# Patient Record
Sex: Female | Born: 1975 | Race: White | Hispanic: No | Marital: Single | Smoking: Current every day smoker
Health system: Southern US, Community
[De-identification: ages and names within clinical notes are randomized; demographics above are authoritative.]

## PROBLEM LIST (undated history)

## (undated) ENCOUNTER — Emergency Department (HOSPITAL_COMMUNITY): Admission: EM | Payer: Self-pay | Source: Home / Self Care

## (undated) DIAGNOSIS — I10 Essential (primary) hypertension: Secondary | ICD-10-CM

## (undated) DIAGNOSIS — J449 Chronic obstructive pulmonary disease, unspecified: Secondary | ICD-10-CM

## (undated) DIAGNOSIS — K7460 Unspecified cirrhosis of liver: Secondary | ICD-10-CM

---

## 2010-04-01 ENCOUNTER — Inpatient Hospital Stay (HOSPITAL_COMMUNITY): Admission: EM | Admit: 2010-04-01 | Discharge: 2010-04-05 | Payer: Self-pay | Admitting: Emergency Medicine

## 2010-06-17 ENCOUNTER — Emergency Department (HOSPITAL_COMMUNITY)
Admission: EM | Admit: 2010-06-17 | Discharge: 2010-06-17 | Disposition: A | Discharge status: Home / Self Care | Payer: Self-pay | Attending: Emergency Medicine | Admitting: Emergency Medicine

## 2010-06-17 ENCOUNTER — Emergency Department (HOSPITAL_COMMUNITY): Payer: Self-pay

## 2010-06-17 DIAGNOSIS — I10 Essential (primary) hypertension: Secondary | ICD-10-CM | POA: Insufficient documentation

## 2010-06-17 DIAGNOSIS — X500XXA Overexertion from strenuous movement or load, initial encounter: Secondary | ICD-10-CM | POA: Insufficient documentation

## 2010-06-17 DIAGNOSIS — S92309A Fracture of unspecified metatarsal bone(s), unspecified foot, initial encounter for closed fracture: Secondary | ICD-10-CM | POA: Insufficient documentation

## 2010-06-17 DIAGNOSIS — Y929 Unspecified place or not applicable: Secondary | ICD-10-CM | POA: Insufficient documentation

## 2010-06-17 DIAGNOSIS — S9030XA Contusion of unspecified foot, initial encounter: Secondary | ICD-10-CM | POA: Insufficient documentation

## 2010-06-17 DIAGNOSIS — M7989 Other specified soft tissue disorders: Secondary | ICD-10-CM | POA: Insufficient documentation

## 2010-06-17 DIAGNOSIS — M79609 Pain in unspecified limb: Secondary | ICD-10-CM | POA: Insufficient documentation

## 2010-07-17 LAB — BASIC METABOLIC PANEL
BUN: 3 mg/dL — ABNORMAL LOW (ref 6–23)
CO2: 22 mEq/L (ref 19–32)
Chloride: 111 mEq/L (ref 96–112)
GFR calc non Af Amer: >60 mL/min (ref >60)
Glucose, Bld: 91 mg/dL (ref 70–99)
Potassium: 4.3 mEq/L (ref 3.5–5.1)
Sodium: 139 mEq/L (ref 135–145)

## 2010-07-17 LAB — CBC
HCT: 32.1 % — ABNORMAL LOW (ref 36.0–46.0)
Hemoglobin: 10.9 g/dL — ABNORMAL LOW (ref 12.0–15.0)
MCH: 36.7 pg — ABNORMAL HIGH (ref 26.0–34.0)
MCHC: 34 g/dL (ref 30.0–36.0)
MCV: 108.1 fL — ABNORMAL HIGH (ref 78.0–100.0)
RDW: 13.8 % (ref 11.5–15.5)

## 2010-07-17 LAB — DIFFERENTIAL
Basophils Relative: 1 % (ref 0–1)
Eosinophils Relative: 4 % (ref 0–5)
Monocytes Absolute: 0.4 10*3/uL (ref 0.1–1.0)
Monocytes Relative: 8 % (ref 3–12)
Neutro Abs: 3.4 10*3/uL (ref 1.7–7.7)

## 2010-07-18 LAB — CBC
HCT: 33.1 % — ABNORMAL LOW (ref 36.0–46.0)
HCT: 38.2 % (ref 36.0–46.0)
Hemoglobin: 11.5 g/dL — ABNORMAL LOW (ref 12.0–15.0)
Hemoglobin: 13.8 g/dL (ref 12.0–15.0)
MCH: 36.7 pg — ABNORMAL HIGH (ref 26.0–34.0)
MCHC: 34.9 g/dL (ref 30.0–36.0)
MCV: 102.4 fL — ABNORMAL HIGH (ref 78.0–100.0)
MCV: 104.4 fL — ABNORMAL HIGH (ref 78.0–100.0)
Platelets: 178 10*3/uL (ref 150–400)
Platelets: 216 10*3/uL (ref 150–400)
RBC: 3.16 MIL/uL — ABNORMAL LOW (ref 3.87–5.11)
RBC: 3.33 MIL/uL — ABNORMAL LOW (ref 3.87–5.11)
RBC: 3.74 MIL/uL — ABNORMAL LOW (ref 3.87–5.11)
WBC: 6.2 10*3/uL (ref 4.0–10.5)
WBC: 7.2 10*3/uL (ref 4.0–10.5)
WBC: 7.5 10*3/uL (ref 4.0–10.5)

## 2010-07-18 LAB — BASIC METABOLIC PANEL
CO2: 23 mEq/L (ref 19–32)
Calcium: 8.5 mg/dL (ref 8.4–10.5)
Creatinine, Ser: 0.67 mg/dL (ref 0.4–1.2)
GFR calc Af Amer: >60 mL/min (ref >60)
GFR calc non Af Amer: 37 mL/min — ABNORMAL LOW (ref >60)
GFR calc non Af Amer: >60 mL/min (ref >60)
GFR calc non Af Amer: >60 mL/min (ref >60)
Potassium: 2.6 mEq/L — CL (ref 3.5–5.1)
Potassium: 3.2 mEq/L — ABNORMAL LOW (ref 3.5–5.1)
Sodium: 133 mEq/L — ABNORMAL LOW (ref 135–145)
Sodium: 139 mEq/L (ref 135–145)
Sodium: 144 mEq/L (ref 135–145)

## 2010-07-18 LAB — HEPATIC FUNCTION PANEL
AST: 102 U/L — ABNORMAL HIGH (ref 0–37)
Albumin: 3.4 g/dL — ABNORMAL LOW (ref 3.5–5.2)
Alkaline Phosphatase: 115 U/L (ref 39–117)
Total Bilirubin: 0.6 mg/dL (ref 0.3–1.2)

## 2010-07-18 LAB — COMPREHENSIVE METABOLIC PANEL
ALT: 38 U/L — ABNORMAL HIGH (ref 0–35)
ALT: 49 U/L — ABNORMAL HIGH (ref 0–35)
AST: 72 U/L — ABNORMAL HIGH (ref 0–37)
Albumin: 2.8 g/dL — ABNORMAL LOW (ref 3.5–5.2)
Alkaline Phosphatase: 82 U/L (ref 39–117)
Alkaline Phosphatase: 86 U/L (ref 39–117)
BUN: 2 mg/dL — ABNORMAL LOW (ref 6–23)
BUN: 2 mg/dL — ABNORMAL LOW (ref 6–23)
CO2: 22 mEq/L (ref 19–32)
Chloride: 108 mEq/L (ref 96–112)
Chloride: 110 mEq/L (ref 96–112)
GFR calc non Af Amer: >60 mL/min (ref >60)
Glucose, Bld: 108 mg/dL — ABNORMAL HIGH (ref 70–99)
Potassium: 3.3 mEq/L — ABNORMAL LOW (ref 3.5–5.1)
Potassium: 3.4 mEq/L — ABNORMAL LOW (ref 3.5–5.1)
Sodium: 139 mEq/L (ref 135–145)
Total Bilirubin: 0.5 mg/dL (ref 0.3–1.2)
Total Bilirubin: 0.6 mg/dL (ref 0.3–1.2)

## 2010-07-18 LAB — CULTURE, RESPIRATORY W GRAM STAIN

## 2010-07-18 LAB — URINALYSIS, ROUTINE W REFLEX MICROSCOPIC
Nitrite: NEGATIVE
Specific Gravity, Urine: 1.025 (ref 1.005–1.030)
Urobilinogen, UA: 1 mg/dL (ref 0.0–1.0)

## 2010-07-18 LAB — ETHANOL: Alcohol, Ethyl (B): <5 mg/dL (ref 0–10)

## 2010-07-18 LAB — OSMOLALITY: Osmolality: 356 mOsm/kg — ABNORMAL HIGH (ref 275–300)

## 2010-07-18 LAB — DIFFERENTIAL
Basophils Absolute: 0 10*3/uL (ref 0.0–0.1)
Basophils Absolute: 0 10*3/uL (ref 0.0–0.1)
Basophils Absolute: 0 10*3/uL (ref 0.0–0.1)
Basophils Relative: 0 % (ref 0–1)
Basophils Relative: 1 % (ref 0–1)
Basophils Relative: 1 % (ref 0–1)
Eosinophils Absolute: 0 10*3/uL (ref 0.0–0.7)
Eosinophils Absolute: 0 10*3/uL (ref 0.0–0.7)
Eosinophils Absolute: 0.1 10*3/uL (ref 0.0–0.7)
Eosinophils Relative: 0 % (ref 0–5)
Lymphocytes Relative: 18 % (ref 12–46)
Lymphs Abs: 1.3 10*3/uL (ref 0.7–4.0)
Lymphs Abs: 1.8 10*3/uL (ref 0.7–4.0)
Monocytes Absolute: 0.5 10*3/uL (ref 0.1–1.0)
Monocytes Absolute: 0.7 10*3/uL (ref 0.1–1.0)
Monocytes Relative: 7 % (ref 3–12)
Monocytes Relative: 9 % (ref 3–12)
Neutro Abs: 2 10*3/uL (ref 1.7–7.7)
Neutro Abs: 3.9 10*3/uL (ref 1.7–7.7)
Neutro Abs: 5.4 10*3/uL (ref 1.7–7.7)
Neutrophils Relative %: 47 % (ref 43–77)
Neutrophils Relative %: 62 % (ref 43–77)

## 2010-07-18 LAB — MAGNESIUM
Magnesium: 1.4 mg/dL — ABNORMAL LOW (ref 1.5–2.5)
Magnesium: 1.4 mg/dL — ABNORMAL LOW (ref 1.5–2.5)

## 2010-07-18 LAB — RAPID URINE DRUG SCREEN, HOSP PERFORMED
Amphetamines: NOT DETECTED
Cocaine: NOT DETECTED
Opiates: NOT DETECTED
Tetrahydrocannabinol: POSITIVE — AB

## 2010-07-18 LAB — EXPECTORATED SPUTUM ASSESSMENT W GRAM STAIN, RFLX TO RESP C

## 2010-07-18 LAB — TSH: TSH: 1.112 u[IU]/mL (ref 0.350–4.500)

## 2010-07-18 LAB — ACETAMINOPHEN LEVEL: Acetaminophen (Tylenol), Serum: <10 ug/mL — ABNORMAL LOW (ref 10–30)

## 2010-07-18 LAB — PREGNANCY, URINE: Preg Test, Ur: NEGATIVE

## 2010-09-03 ENCOUNTER — Emergency Department (HOSPITAL_COMMUNITY): Payer: Self-pay

## 2010-09-03 ENCOUNTER — Encounter (HOSPITAL_COMMUNITY): Payer: Self-pay | Admitting: Radiology

## 2010-09-03 ENCOUNTER — Emergency Department (HOSPITAL_COMMUNITY)
Admission: EM | Admit: 2010-09-03 | Discharge: 2010-09-03 | Disposition: A | Discharge status: Home / Self Care | Payer: Self-pay | Attending: Emergency Medicine | Admitting: Emergency Medicine

## 2010-09-03 DIAGNOSIS — F17200 Nicotine dependence, unspecified, uncomplicated: Secondary | ICD-10-CM | POA: Insufficient documentation

## 2010-09-03 DIAGNOSIS — R509 Fever, unspecified: Secondary | ICD-10-CM | POA: Insufficient documentation

## 2010-09-03 DIAGNOSIS — I10 Essential (primary) hypertension: Secondary | ICD-10-CM | POA: Insufficient documentation

## 2010-09-03 DIAGNOSIS — B86 Scabies: Secondary | ICD-10-CM | POA: Insufficient documentation

## 2010-09-03 DIAGNOSIS — F1010 Alcohol abuse, uncomplicated: Secondary | ICD-10-CM | POA: Insufficient documentation

## 2010-09-03 DIAGNOSIS — R079 Chest pain, unspecified: Secondary | ICD-10-CM | POA: Insufficient documentation

## 2010-09-03 DIAGNOSIS — R110 Nausea: Secondary | ICD-10-CM | POA: Insufficient documentation

## 2010-09-03 DIAGNOSIS — J189 Pneumonia, unspecified organism: Secondary | ICD-10-CM | POA: Insufficient documentation

## 2010-09-03 LAB — URINALYSIS, ROUTINE W REFLEX MICROSCOPIC
Nitrite: NEGATIVE
Specific Gravity, Urine: 1.019 (ref 1.005–1.030)
Urobilinogen, UA: 0.2 mg/dL (ref 0.0–1.0)

## 2010-09-03 LAB — COMPREHENSIVE METABOLIC PANEL
Albumin: 2.5 g/dL — ABNORMAL LOW (ref 3.5–5.2)
BUN: 5 mg/dL — ABNORMAL LOW (ref 6–23)
Chloride: 103 mEq/L (ref 96–112)
Creatinine, Ser: 0.62 mg/dL (ref 0.4–1.2)
GFR calc non Af Amer: >60 mL/min (ref >60)
Total Bilirubin: 0.6 mg/dL (ref 0.3–1.2)

## 2010-09-03 LAB — URINE MICROSCOPIC-ADD ON

## 2010-09-03 LAB — DIFFERENTIAL
Eosinophils Absolute: 0 10*3/uL (ref 0.0–0.7)
Eosinophils Relative: 0 % (ref 0–5)
Lymphs Abs: 0.5 10*3/uL — ABNORMAL LOW (ref 0.7–4.0)
Monocytes Relative: 5 % (ref 3–12)
Neutrophils Relative %: 90 % — ABNORMAL HIGH (ref 43–77)

## 2010-09-03 LAB — CBC
MCH: 35.7 pg — ABNORMAL HIGH (ref 26.0–34.0)
MCV: 104.4 fL — ABNORMAL HIGH (ref 78.0–100.0)
Platelets: 241 10*3/uL (ref 150–400)
RBC: 3.61 MIL/uL — ABNORMAL LOW (ref 3.87–5.11)

## 2010-09-04 LAB — URINE CULTURE: Culture  Setup Time: 201204301642

## 2011-02-20 ENCOUNTER — Emergency Department (HOSPITAL_COMMUNITY): Payer: Self-pay

## 2011-02-20 ENCOUNTER — Emergency Department (HOSPITAL_COMMUNITY)
Admission: EM | Admit: 2011-02-20 | Discharge: 2011-02-20 | Disposition: A | Discharge status: Home / Self Care | Payer: Self-pay | Attending: Emergency Medicine | Admitting: Emergency Medicine

## 2011-02-20 DIAGNOSIS — Z79899 Other long term (current) drug therapy: Secondary | ICD-10-CM | POA: Insufficient documentation

## 2011-02-20 DIAGNOSIS — F1010 Alcohol abuse, uncomplicated: Secondary | ICD-10-CM | POA: Insufficient documentation

## 2011-02-20 DIAGNOSIS — G40802 Other epilepsy, not intractable, without status epilepticus: Secondary | ICD-10-CM | POA: Insufficient documentation

## 2011-02-20 DIAGNOSIS — F17200 Nicotine dependence, unspecified, uncomplicated: Secondary | ICD-10-CM | POA: Insufficient documentation

## 2011-02-20 DIAGNOSIS — I10 Essential (primary) hypertension: Secondary | ICD-10-CM | POA: Insufficient documentation

## 2011-02-20 DIAGNOSIS — R55 Syncope and collapse: Secondary | ICD-10-CM | POA: Insufficient documentation

## 2011-02-20 LAB — RAPID URINE DRUG SCREEN, HOSP PERFORMED
Amphetamines: NOT DETECTED
Tetrahydrocannabinol: NOT DETECTED

## 2011-02-20 LAB — POCT I-STAT, CHEM 8
BUN: 4 mg/dL — ABNORMAL LOW (ref 6–23)
Calcium, Ion: 1 mmol/L — ABNORMAL LOW (ref 1.12–1.32)
Chloride: 91 mEq/L — ABNORMAL LOW (ref 96–112)
Creatinine, Ser: 1 mg/dL (ref 0.50–1.10)
Glucose, Bld: 92 mg/dL (ref 70–99)

## 2011-02-20 LAB — URINALYSIS, ROUTINE W REFLEX MICROSCOPIC
Bilirubin Urine: NEGATIVE
Glucose, UA: NEGATIVE mg/dL
Hgb urine dipstick: NEGATIVE
Ketones, ur: NEGATIVE mg/dL
pH: 5 (ref 5.0–8.0)

## 2011-03-19 ENCOUNTER — Encounter (HOSPITAL_COMMUNITY): Payer: Self-pay | Admitting: Licensed Clinical Social Worker

## 2011-03-19 ENCOUNTER — Emergency Department (HOSPITAL_COMMUNITY)
Admission: EM | Admit: 2011-03-19 | Discharge: 2011-03-20 | Disposition: A | Discharge status: Home / Self Care | Payer: Self-pay | Attending: Emergency Medicine | Admitting: Emergency Medicine

## 2011-03-19 ENCOUNTER — Telehealth (HOSPITAL_COMMUNITY): Payer: Self-pay | Admitting: Licensed Clinical Social Worker

## 2011-03-19 DIAGNOSIS — F1910 Other psychoactive substance abuse, uncomplicated: Secondary | ICD-10-CM | POA: Insufficient documentation

## 2011-03-19 DIAGNOSIS — F1010 Alcohol abuse, uncomplicated: Secondary | ICD-10-CM | POA: Insufficient documentation

## 2011-03-19 DIAGNOSIS — R259 Unspecified abnormal involuntary movements: Secondary | ICD-10-CM | POA: Insufficient documentation

## 2011-03-19 DIAGNOSIS — M25473 Effusion, unspecified ankle: Secondary | ICD-10-CM | POA: Insufficient documentation

## 2011-03-19 DIAGNOSIS — M25476 Effusion, unspecified foot: Secondary | ICD-10-CM | POA: Insufficient documentation

## 2011-03-19 DIAGNOSIS — I10 Essential (primary) hypertension: Secondary | ICD-10-CM | POA: Insufficient documentation

## 2011-03-19 DIAGNOSIS — R062 Wheezing: Secondary | ICD-10-CM | POA: Insufficient documentation

## 2011-03-19 DIAGNOSIS — R609 Edema, unspecified: Secondary | ICD-10-CM | POA: Insufficient documentation

## 2011-03-19 LAB — DIFFERENTIAL
Basophils Relative: 1 % (ref 0–1)
Eosinophils Absolute: 0.1 10*3/uL (ref 0.0–0.7)
Lymphs Abs: 1.7 10*3/uL (ref 0.7–4.0)
Monocytes Absolute: 0.6 10*3/uL (ref 0.1–1.0)
Monocytes Relative: 10 % (ref 3–12)
Neutro Abs: 3.7 10*3/uL (ref 1.7–7.7)

## 2011-03-19 LAB — CBC
HCT: 36.4 % (ref 36.0–46.0)
Hemoglobin: 12.8 g/dL (ref 12.0–15.0)
MCH: 36.5 pg — ABNORMAL HIGH (ref 26.0–34.0)
MCHC: 35.2 g/dL (ref 30.0–36.0)
RBC: 3.51 MIL/uL — ABNORMAL LOW (ref 3.87–5.11)

## 2011-03-19 LAB — RAPID URINE DRUG SCREEN, HOSP PERFORMED
Cocaine: POSITIVE — AB
Opiates: NOT DETECTED

## 2011-03-19 LAB — POCT I-STAT, CHEM 8
BUN: <3 mg/dL — ABNORMAL LOW (ref 6–23)
Creatinine, Ser: 0.7 mg/dL (ref 0.50–1.10)
Glucose, Bld: 78 mg/dL (ref 70–99)
Hemoglobin: 13.6 g/dL (ref 12.0–15.0)
Potassium: 3.8 mEq/L (ref 3.5–5.1)

## 2011-03-19 MED ORDER — LORAZEPAM 1 MG PO TABS
1.0000 mg | ORAL_TABLET | Freq: Once | ORAL | Status: AC
  Administered 2011-03-19: 1 mg via ORAL

## 2011-03-19 MED ORDER — ONDANSETRON 4 MG PO TBDP
ORAL_TABLET | ORAL | Status: AC
  Administered 2011-03-19: 4 mg
  Filled 2011-03-19: qty 1

## 2011-03-19 MED ORDER — ONDANSETRON HCL 4 MG/2ML IJ SOLN: 4.0000 mg | Freq: Once | INTRAMUSCULAR | Status: DC

## 2011-03-19 MED ORDER — NICOTINE 21 MG/24HR TD PT24
21.0000 mg | MEDICATED_PATCH | Freq: Once | TRANSDERMAL | Status: DC
  Administered 2011-03-19: 21 mg via TRANSDERMAL
  Filled 2011-03-19: qty 1

## 2011-03-19 MED ORDER — ONDANSETRON HCL 4 MG/2ML IJ SOLN
INTRAMUSCULAR | Status: AC
  Filled 2011-03-19: qty 2

## 2011-03-19 MED ORDER — LORAZEPAM 1 MG PO TABS
ORAL_TABLET | ORAL | Status: AC
  Filled 2011-03-19: qty 1

## 2011-03-19 MED ORDER — NICOTINE 21 MG/24HR TD PT24: 21.0000 mg | MEDICATED_PATCH | Freq: Once | TRANSDERMAL | Status: DC

## 2011-03-19 MED ORDER — CLONIDINE HCL 0.1 MG PO TABS
ORAL_TABLET | ORAL | Status: AC
  Filled 2011-03-19: qty 2

## 2011-03-19 MED ORDER — NICOTINE 21 MG/24HR TD PT24
MEDICATED_PATCH | TRANSDERMAL | Status: AC
  Filled 2011-03-19: qty 1

## 2011-03-19 MED ORDER — ACETAMINOPHEN 325 MG PO TABS: 650.0000 mg | ORAL_TABLET | Freq: Once | ORAL | Status: DC

## 2011-03-19 MED ORDER — ACETAMINOPHEN 325 MG PO TABS
ORAL_TABLET | ORAL | Status: AC
  Administered 2011-03-19: 600 mg
  Filled 2011-03-19: qty 2

## 2011-03-19 MED ORDER — CLONIDINE HCL 0.1 MG PO TABS
0.2000 mg | ORAL_TABLET | Freq: Once | ORAL | Status: AC
  Administered 2011-03-19: 0.2 mg via ORAL

## 2011-03-19 NOTE — ED Notes (Signed)
[place] sandwich given to patient and family friend as well as ordered warm food tray for patient.  Patient ambulatory to bathroom and returned to room without incident.

## 2011-03-19 NOTE — ED Provider Notes (Addendum)
History     CSN: [id number] Arrival date & time: 03/19/2011 11:25 AM   First MD Initiated Contact with Patient 03/19/11 1157      Chief Complaint  Patient presents with  . Drug / Alcohol Assessment    (Consider location/radiation/quality/duration/timing/severity/associated sxs/prior treatment) HPI This is a 35 year old female with PMH of hypertension and polysubstance abuse who presents to ED for "detox from alcohol". The history is provided provided by patient. Patient states that she drank 4 beers this morning and decided to come to ED for detox from alcohol. Patient states that she feels fine now. She reports a history of abusing alcohol for 22 years with 12 packs beers a day, with which she mixes liquor or wine at times. She has never tried alcohol detox program or resources. Patient states that she only had crystal Meth detox program in the past.  Of note patient also reports a history of smoking cigarettes 1 pack a day for 25 years and abusing weed and cocaine intermittently, with last cocaine abuse days ago. Denies fever, chills or sore throat. Denies chest pain, chest pressure or palpitation. Denies nausea, vomiting or abdominal pain. Denies weakness, numbness or tingling.  Patient states that she is homeless and [place] and [place].  Past Medical History: HTN  Social history  Homeless Alcohol 12 packs/day for 22 years Smoking 1 pack/day for 25 years Abuse weed and cocaine.   Review of Systems See HPI  Allergies  Sulfa antibiotics  Home Medications   Current Outpatient Rx  Name Route Sig Dispense Refill  . ALBUTEROL SULFATE HFA 108 (90 BASE) MCG/ACT IN AERS Inhalation Inhale 2 puffs into the lungs every 4 (four) hours as needed. For shortness of breath       BP 139/94  Pulse 99  Temp(Src) 98.4 F (36.9 C) (Oral)  Resp 20  SpO2 97%  Physical Exam General: alert, well-developed, and cooperative to examination.  Head: normocephalic and  atraumatic.  Eyes: vision grossly intact, pupils equal, pupils round, pupils reactive to light, no injection and anicteric.  Mouth: pharynx pink and moist, no erythema, and no exudates.  Neck: supple, full ROM, no thyromegaly, no JVD, and no carotid bruits.  Lungs: normal respiratory effort, no accessory muscle use,scatterred wheezing noted, no crackles. Heart: normal rate, regular rhythm, no murmur, no gallop, and no rub.  Abdomen: soft, non-tender, normal bowel sounds, no distention, no guarding, no rebound tenderness, no hepatomegaly, and no splenomegaly.  Msk: no joint swelling, no joint warmth, and no redness over joints.  Pulses: 2+ DP/PT pulses bilaterally Extremities: No cyanosis or clubbing. Left lower extremity 1+ pitting edema. Right ankle trace edema. Neurologic: alert & oriented X3, cranial nerves II-XII intact, strength normal in all extremities, sensation intact to light touch, and gait normal.  Skin: turgor normal and no rashes.  Bilateral hands mild tremor noted. Psych: Oriented X3, memory intact for recent and remote, normally interactive, good eye contact, not anxious appearing, and not depressed appearing.   ED Course  Procedures (including critical care time)          Dede Query, MD 03/19/11 1230

## 2011-03-19 NOTE — ED Notes (Signed)
Spoke with Berna Spare with ACT team and informed about recent BP readings. Also notified MD. Orders received. Pt aaox3, resp e/u, nad. Will recheck BP 30-60 minutes after med administration and report results to [place] with ACT.
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
## 2011-03-19 NOTE — ED Notes (Signed)
ACT team at bedside.  

## 2011-03-19 NOTE — ED Notes (Signed)
ACT team stated accepted at [facility] will needed to have blood pressure decreased ill check in one hour and contact ACT team with results.

## 2011-03-19 NOTE — ED Notes (Signed)
Phoned Karen Pham with ACT and informed him of pt's recent BP. Berna Spare will contact [facility] and will call RN with update info.

## 2011-03-19 NOTE — ED Notes (Signed)
Patient ax4 resting comfortably on stretcher with family friend at bedside. Call light in place and was given a warm blanket. Patient currently watching tv.

## 2011-03-19 NOTE — ED Notes (Signed)
Phoned Berna Spare with ACT team to inquire about [facility] placement. Currently waiting on [facility] to phone ACT back. Berna Spare will phone RN once information obtained. Updated patient/family on plan of care. Nursing/pt report given to BB, RN.

## 2011-03-19 NOTE — ED Provider Notes (Addendum)
I saw and evaluated the patient, reviewed the resident's note and I agree with the findings and plan.  Behavioral Health contacted and will evaluate for alcohol Detox placement.  Nelia Shi, MD 03/19/11 2149  Nelia Shi, MD 03/19/11 [phone]

## 2011-03-19 NOTE — BH Assessment (Signed)
Pt referred to [facility] for in-pt treatment. Pt was medically cleared, [facility] assessment was completed, and referral has been made to [facility] for in-pt treatment. Writer confirmed bed availability at [facility]. Faxed pertinent information to [facility] such as labs, clinicals, demographics, etc. For review. Writer completed phone interview with Lynden Ang and and she inquired about current/recent vitals. Writer obtained pt's most recent and updated vital. Unfortunately pt's BP has increased and is a concern for [facility] at this time. Lynden Ang has requested additional and stable vitals. Pt's nurse sts that he will re-take and re-evaluate pt's vitals in approx. 1 hr.  (8pm). Lynden Ang sts that she will hold on to pt's paperwork for now. Writer made on-coming staff "Berna Spare" aware of pt's pending disposition at [facility]. Berna Spare will follow-up with pt's disposition and placement at [facility].   Pt made aware that her disposition may possible change pending the results of her future vitals. Pt agreeable to received treatment at [facility] or alternative facilities that are able to appropriately care for her with unstable vitals.

## 2011-03-19 NOTE — ED Notes (Signed)
To ed with request for detox from etoh. Last etoh was 2 hrs pta. Pt states she drinks whatever she can afford for the day and it usually is a 12 pack.

## 2011-03-19 NOTE — ED Notes (Signed)
Pt here for detox from alcohol.  Pt reports that she consumes a 12 pack daily.  Pt reports that this has been the worst over the past year.  Pt reports that she has had treatment in the past for Crystal Meth.  Pt denies SI and HI.  Pt denies pain.  Last drink was 2 hrs ago and it was 4 12 oz beers.

## 2011-03-20 ENCOUNTER — Encounter (HOSPITAL_COMMUNITY): Payer: Self-pay

## 2011-03-20 MED ORDER — IBUPROFEN 200 MG PO TABS: 600.0000 mg | ORAL_TABLET | Freq: Three times a day (TID) | ORAL | Status: DC | PRN

## 2011-03-20 MED ORDER — LORAZEPAM 2 MG/ML IJ SOLN: 2.0000 mg | INTRAMUSCULAR | Status: DC | PRN

## 2011-03-20 MED ORDER — ZOLPIDEM TARTRATE 5 MG PO TABS
5.0000 mg | ORAL_TABLET | Freq: Every evening | ORAL | Status: DC | PRN
  Administered 2011-03-20: 5 mg via ORAL
  Filled 2011-03-20 (×2): qty 1

## 2011-03-20 MED ORDER — ACETAMINOPHEN 325 MG PO TABS: 650.0000 mg | ORAL_TABLET | ORAL | Status: DC | PRN

## 2011-03-20 MED ORDER — LORAZEPAM 2 MG/ML IJ SOLN
2.0000 mg | Freq: Four times a day (QID) | INTRAMUSCULAR | Status: DC | PRN
  Administered 2011-03-20 (×2): 2 mg via INTRAMUSCULAR
  Filled 2011-03-20: qty 1

## 2011-03-20 MED ORDER — CLONIDINE HCL 0.1 MG PO TABS: 0.2000 mg | ORAL_TABLET | ORAL | Status: DC | PRN

## 2011-03-20 MED ORDER — LORAZEPAM 2 MG/ML IJ SOLN
1.0000 mg | INTRAMUSCULAR | Status: DC | PRN
  Filled 2011-03-20: qty 1

## 2011-03-20 MED ORDER — ONDANSETRON HCL 8 MG PO TABS: 4.0000 mg | ORAL_TABLET | Freq: Three times a day (TID) | ORAL | Status: DC | PRN

## 2011-03-20 NOTE — Progress Notes (Signed)
Patient has reportedly been accepted at [facility], but is reportedly will not be able to be transported until the morning. This evening, she has required Ativan for alcohol withdrawal, and clonidine for blood pressure control.

## 2011-03-20 NOTE — ED Notes (Signed)
Pt. Showing signs of shaking / withdrawal.  Pt. Given Lorazepam per orders. Pt. Stable , pt. Ate 100 % of breakfast coke given, Pt. Resting comfortablly, awaiting for [facility] to come. Karen Pham from ACT team also talking with [facility]

## 2011-03-20 NOTE — ED Notes (Signed)
Patient is resting comfortably. 

## 2011-03-20 NOTE — ED Notes (Signed)
Pt family departing at this time request call if condition changes at  [phone] home phone Kirstim friend

## 2011-03-20 NOTE — Progress Notes (Signed)
This clinician talked with Karen Pham at [facility] at 01:01.  She said that since patient's blood pressure has come down, they can accept her.  [facility] cannot take her however until 10:00.  She did confirm that they will send a driver to provide transportation for patient.  Nurse can call report when the driver and patient are leaving.  Dr. Lynelle Doctor informed of disposition.

## 2011-03-20 NOTE — ED Provider Notes (Signed)
Patient has been sleeping. Per Berna Spare, ACT, patient has been accepted at [facility], but they cannot pick her up until 10 am today.   Ward Givens, MD 03/20/11 [phone]

## 2015-11-03 ENCOUNTER — Emergency Department (HOSPITAL_BASED_OUTPATIENT_CLINIC_OR_DEPARTMENT_OTHER): Payer: Self-pay

## 2015-11-03 ENCOUNTER — Emergency Department (HOSPITAL_BASED_OUTPATIENT_CLINIC_OR_DEPARTMENT_OTHER)
Admission: EM | Admit: 2015-11-03 | Discharge: 2015-11-03 | Disposition: A | Discharge status: Home / Self Care | Payer: Self-pay | Attending: Emergency Medicine | Admitting: Emergency Medicine

## 2015-11-03 ENCOUNTER — Encounter (HOSPITAL_BASED_OUTPATIENT_CLINIC_OR_DEPARTMENT_OTHER): Payer: Self-pay | Admitting: *Deleted

## 2015-11-03 DIAGNOSIS — J449 Chronic obstructive pulmonary disease, unspecified: Secondary | ICD-10-CM | POA: Insufficient documentation

## 2015-11-03 DIAGNOSIS — F17210 Nicotine dependence, cigarettes, uncomplicated: Secondary | ICD-10-CM | POA: Insufficient documentation

## 2015-11-03 DIAGNOSIS — R600 Localized edema: Secondary | ICD-10-CM | POA: Insufficient documentation

## 2015-11-03 DIAGNOSIS — R609 Edema, unspecified: Secondary | ICD-10-CM

## 2015-11-03 DIAGNOSIS — Z79899 Other long term (current) drug therapy: Secondary | ICD-10-CM | POA: Insufficient documentation

## 2015-11-03 HISTORY — DX: Chronic obstructive pulmonary disease, unspecified: J44.9

## 2015-11-03 HISTORY — DX: Unspecified cirrhosis of liver: K74.60

## 2015-11-03 LAB — CBC WITH DIFFERENTIAL/PLATELET
BASOS ABS: 0.1 10*3/uL (ref 0.0–0.1)
BASOS PCT: 1 %
EOS ABS: 0.2 10*3/uL (ref 0.0–0.7)
Eosinophils Relative: 2 %
HCT: 39.3 % (ref 36.0–46.0)
HEMOGLOBIN: 13.1 g/dL (ref 12.0–15.0)
Lymphocytes Relative: 34 %
Lymphs Abs: 2.9 10*3/uL (ref 0.7–4.0)
MCH: 35 pg — ABNORMAL HIGH (ref 26.0–34.0)
MCHC: 33.3 g/dL (ref 30.0–36.0)
MCV: 105.1 fL — ABNORMAL HIGH (ref 78.0–100.0)
MONOS PCT: 8 %
Monocytes Absolute: 0.6 10*3/uL (ref 0.1–1.0)
NEUTROS ABS: 4.6 10*3/uL (ref 1.7–7.7)
NEUTROS PCT: 55 %
Platelets: 466 10*3/uL — ABNORMAL HIGH (ref 150–400)
RBC: 3.74 MIL/uL — AB (ref 3.87–5.11)
RDW: 12.3 % (ref 11.5–15.5)
WBC: 8.3 10*3/uL (ref 4.0–10.5)

## 2015-11-03 LAB — COMPREHENSIVE METABOLIC PANEL
ALBUMIN: 3.2 g/dL — AB (ref 3.5–5.0)
ALK PHOS: 86 U/L (ref 38–126)
ALT: 19 U/L (ref 14–54)
ANION GAP: 9 (ref 5–15)
AST: 37 U/L (ref 15–41)
BUN: 5 mg/dL — AB (ref 6–20)
CALCIUM: 9 mg/dL (ref 8.9–10.3)
CO2: 24 mmol/L (ref 22–32)
Chloride: 107 mmol/L (ref 101–111)
Creatinine, Ser: 0.74 mg/dL (ref 0.44–1.00)
GFR calc Af Amer: >60 mL/min (ref >60)
GFR calc non Af Amer: >60 mL/min (ref >60)
GLUCOSE: 86 mg/dL (ref 65–99)
Potassium: 3.3 mmol/L — ABNORMAL LOW (ref 3.5–5.1)
SODIUM: 140 mmol/L (ref 135–145)
Total Bilirubin: 0.4 mg/dL (ref 0.3–1.2)
Total Protein: 6.8 g/dL (ref 6.5–8.1)

## 2015-11-03 LAB — BRAIN NATRIURETIC PEPTIDE: B Natriuretic Peptide: 276.7 pg/mL — ABNORMAL HIGH (ref 0.0–100.0)

## 2015-11-03 MED ORDER — FUROSEMIDE 20 MG PO TABS: 20.0000 mg | ORAL_TABLET | Freq: Every day | ORAL | Status: DC

## 2015-11-03 NOTE — ED Notes (Addendum)
Feet are swollen. Right great toe is numb. She is an inpt at [facility]. She has been there 4 days She is very shaky. On the phone on arrival.

## 2015-11-03 NOTE — ED Notes (Signed)
[facility] has been notified of discharge, sending someone to pick her up.

## 2015-11-03 NOTE — Discharge Instructions (Signed)
°  You have been evaluated for the swelling of her legs. This is likely due to your heart not functioning at optimal level. Take Lasix as prescribed to help with the swelling. It is important for you to find a primary care provider approximately for further management of your health. You may benefit from evaluation of a cardiologist in the near future to assess for potential heart failure as a cause of the swelling. Avoid drinking alcohol or tobacco use. Return to the ER if you develop worsening shortness of breath, progressive worsening swelling or you have any other concern. Use resource below to find a primary care provider.  Peripheral Edema You have swelling in your legs (peripheral edema). This swelling is due to excess accumulation of salt and water in your body. Edema may be a sign of heart, kidney or liver disease, or a side effect of a medication. It may also be due to problems in the leg veins. Elevating your legs and using special support stockings may be very helpful, if the cause of the swelling is due to poor venous circulation. Avoid long periods of standing, whatever the cause. Treatment of edema depends on identifying the cause. Chips, pretzels, pickles and other salty foods should be avoided. Restricting salt in your diet is almost always needed. Water pills (diuretics) are often used to remove the excess salt and water from your body via urine. These medicines prevent the kidney from reabsorbing sodium. This increases urine flow. Diuretic treatment may also result in lowering of potassium levels in your body. Potassium supplements may be needed if you have to use diuretics daily. Daily weights can help you keep track of your progress in clearing your edema. You should call your caregiver for follow up care as recommended. SEEK IMMEDIATE MEDICAL CARE IF:   You have increased swelling, pain, redness, or heat in your legs.  You develop shortness of breath, especially when lying down.  You  develop chest or abdominal pain, weakness, or fainting.  You have a fever.   This information is not intended to replace advice given to you by your health care provider. Make sure you discuss any questions you have with your health care provider.   Document Released: 05/30/2004 Document Revised: 07/15/2011 Document Reviewed: 11/02/2014 Elsevier Interactive Patient Education [employer].

## 2015-11-03 NOTE — ED Provider Notes (Signed)
CSN: [id number]     Arrival date & time 11/03/15  1317 History   First MD Initiated Contact with Patient 11/03/15 1411     Chief Complaint  Patient presents with  . Foot Swelling     (Consider location/radiation/quality/duration/timing/severity/associated sxs/prior Treatment) HPI   40 year old female with history of liver cirrhosis secondary to alcohol abuse, COPD presenting today with complaint of foot swelling. Patient states she recently was admitted to [facility] inpatient care for alcohol detox for the past week. For the past 4 days she has noticed increasing swelling to both feet with tingling sensation to her toes. She also endorsed mild tightness to her leg towards her shin. She denies any fever, headache, lightheadedness, dizziness, chest worsening chest pain or worsening shortness of breath, abdominal back pain. She has never had this type of swelling before. No prior history of PE or DVT. No history of heart or kidney disease. She denies any recent injury.  Past Medical History  Diagnosis Date  . COPD (chronic obstructive pulmonary disease) ([place])   . Liver cirrhosis ([place])    History reviewed. No pertinent past surgical history. No family history on file. Social History  Substance Use Topics  . Smoking status: Current Every Day Smoker -- 1.00 packs/day    Types: Cigarettes  . Smokeless tobacco: None  . Alcohol Use: 51.6 oz/week    86 Cans of beer per week   OB History    No data available     Review of Systems  All other systems reviewed and are negative.     Allergies  Sulfa antibiotics  Home Medications   Prior to Admission medications   Medication Sig Start Date End Date Taking? Authorizing Provider  atenolol (TENORMIN) 25 MG tablet Take by mouth daily.   Yes Historical Provider, MD  beclomethasone (QVAR) 80 MCG/ACT inhaler Inhale into the lungs 2 (two) times daily.   Yes Historical Provider, MD  busPIRone (BUSPAR) 30 MG tablet Take 30 mg by mouth 2 (two)  times daily.   Yes Historical Provider, MD  gabapentin (NEURONTIN) 300 MG capsule Take 300 mg by mouth 3 (three) times daily.   Yes Historical Provider, MD  HydrOXYzine Pamoate (VISTARIL PO) Take by mouth.   Yes Historical Provider, MD  albuterol (PROVENTIL HFA;VENTOLIN HFA) 108 (90 BASE) MCG/ACT inhaler Inhale 2 puffs into the lungs every 4 (four) hours as needed. For shortness of breath     Historical Provider, MD   BP 149/87 mmHg  Pulse 64  Temp(Src) 98.2 F (36.8 C) (Oral)  Resp 20  Ht 5\' 7"  (1.702 m)  Wt 54.432 kg  BMI 18.79 kg/m2  SpO2 100%  LMP 10/20/2015 Physical Exam  Constitutional: She appears well-developed and well-nourished. No distress.  HENT:  Head: Atraumatic.  Eyes: Conjunctivae are normal.  Neck: Neck supple. No JVD present.  Cardiovascular: Normal rate and regular rhythm.   Pulmonary/Chest: Effort normal and breath sounds normal.  Breath sounds is rhonchus but no obvious wheezes or rales  Abdominal: Soft. There is no tenderness.  Musculoskeletal: She exhibits edema (Trace 1+ pitting edema to bilateral lower extremities. Intact besides pedis pulse bilaterally. Brisk refills.).  No palpable cords or erythema to bilateral lower extremities.  Neurological: She is alert.  Skin: No rash noted.  Psychiatric: She has a normal mood and affect.  Nursing note and vitals reviewed.   ED Course  Procedures (including critical care time) Labs Review Labs Reviewed  CBC WITH DIFFERENTIAL/PLATELET - Abnormal; Notable for the following:  RBC 3.74 (*)    MCV 105.1 (*)    MCH 35.0 (*)    Platelets 466 (*)    All other components within normal limits  COMPREHENSIVE METABOLIC PANEL - Abnormal; Notable for the following:    Potassium 3.3 (*)    BUN 5 (*)    Albumin 3.2 (*)    All other components within normal limits  BRAIN NATRIURETIC PEPTIDE - Abnormal; Notable for the following:    B Natriuretic Peptide 276.7 (*)    All other components within normal limits     Imaging Review Dg Chest 2 View  11/03/2015  CLINICAL DATA:  Lower extremity edema for 2 days EXAM: CHEST  2 VIEW COMPARISON:  September 03, 2010 FINDINGS: Lungs are clear. Heart size and pulmonary vascularity are normal. No adenopathy. No bone lesions. IMPRESSION: No edema or consolidation. Electronically Signed   By: Lowella Grip III M.D.   On: 11/03/2015 15:18   I have personally reviewed and evaluated these images and lab results as part of my medical decision-making.   EKG Interpretation   Date/Time:  Friday November 03 2015 14:39:53 EDT Ventricular Rate:  62 PR Interval:    QRS Duration: 100 QT Interval:  461 QTC Calculation: 469 R Axis:   50 Text Interpretation:  Sinus rhythm Baseline wander in lead(s) I II aVR V2  No significant change since last tracing Confirmed by LITTLE MD, RACHEL  [phone]) on 11/03/2015 2:51:08 PM Also confirmed by LITTLE MD, [place]  [phone]), editor [place], Leda Gauze [phone])  on 11/03/2015 3:17:54 PM      MDM   Final diagnoses:  Peripheral edema    BP 149/87 mmHg  Pulse 64  Temp(Src) 98.2 F (36.8 C) (Oral)  Resp 20  Ht 5\' 7"  (1.702 m)  Wt 54.432 kg  BMI 18.79 kg/m2  SpO2 100%  LMP 10/20/2015   2:34 PM Patient here with complaint of peripheral edema to bilateral lower extremities. No significant calf pain. Workup initiated.  3:22 PM Edema is involving both legs before I have low suspicion for DVT. She does not have any significant calf pain. I suspect this is likely peripheral edema. Given her history of liver cirrhosis, I would check a CMP as well as a CBC. Will evaluate for signs of low albumin which certainly can explain her symptoms. Plan to also check a BNP to evaluate cardiac function. Will also check her renal function. If the labs are unremarkable, patient will be discharge with a short course of Lasix and will follow-up closely with her primary care provider for further management.  Care discussed with Dr. Rex Kras.   3:26 PM EKG  without any concerning arrhythmia and no acute ischemic changes. Chest x-ray shows no evidence of peripheral edema or consolidation. Normal H&H, no signs of anemia.  3:42 PM Patient has an elevated BNP of 276.7. This is likely responsible for her peripheral edema. Mildly decreased abdomen level of 3.2. Normal renal function. Chest x-ray without fluid. I encouraged patient to follow-up with the primary care provider and likely a cardiologist down the road further management of her condition. Referral given. A short course of Lasix provided as well. Return precaution discussed.  Domenic Moras, PA-C 11/03/15 [place], MD 11/06/15 [phone]

## 2015-12-04 ENCOUNTER — Encounter (HOSPITAL_BASED_OUTPATIENT_CLINIC_OR_DEPARTMENT_OTHER): Payer: Self-pay | Admitting: *Deleted

## 2015-12-04 ENCOUNTER — Emergency Department (HOSPITAL_BASED_OUTPATIENT_CLINIC_OR_DEPARTMENT_OTHER)
Admission: EM | Admit: 2015-12-04 | Discharge: 2015-12-04 | Disposition: A | Discharge status: Home / Self Care | Payer: Self-pay | Attending: Emergency Medicine | Admitting: Emergency Medicine

## 2015-12-04 DIAGNOSIS — R202 Paresthesia of skin: Secondary | ICD-10-CM | POA: Insufficient documentation

## 2015-12-04 DIAGNOSIS — Z7951 Long term (current) use of inhaled steroids: Secondary | ICD-10-CM | POA: Insufficient documentation

## 2015-12-04 DIAGNOSIS — F17210 Nicotine dependence, cigarettes, uncomplicated: Secondary | ICD-10-CM | POA: Insufficient documentation

## 2015-12-04 DIAGNOSIS — J449 Chronic obstructive pulmonary disease, unspecified: Secondary | ICD-10-CM | POA: Insufficient documentation

## 2015-12-04 LAB — COMPREHENSIVE METABOLIC PANEL
ALK PHOS: 47 U/L (ref 38–126)
ALT: 12 U/L — AB (ref 14–54)
AST: 19 U/L (ref 15–41)
Albumin: 4 g/dL (ref 3.5–5.0)
Anion gap: 7 (ref 5–15)
BILIRUBIN TOTAL: 0.5 mg/dL (ref 0.3–1.2)
BUN: 7 mg/dL (ref 6–20)
CALCIUM: 9.4 mg/dL (ref 8.9–10.3)
CO2: 26 mmol/L (ref 22–32)
CREATININE: 0.82 mg/dL (ref 0.44–1.00)
Chloride: 106 mmol/L (ref 101–111)
Glucose, Bld: 88 mg/dL (ref 65–99)
Potassium: 3.6 mmol/L (ref 3.5–5.1)
Sodium: 139 mmol/L (ref 135–145)
TOTAL PROTEIN: 7.1 g/dL (ref 6.5–8.1)

## 2015-12-04 LAB — CBC WITH DIFFERENTIAL/PLATELET
BASOS ABS: 0 10*3/uL (ref 0.0–0.1)
Basophils Relative: 0 %
Eosinophils Absolute: 0.3 10*3/uL (ref 0.0–0.7)
Eosinophils Relative: 3 %
HEMATOCRIT: 42.2 % (ref 36.0–46.0)
Hemoglobin: 13.9 g/dL (ref 12.0–15.0)
LYMPHS ABS: 3.1 10*3/uL (ref 0.7–4.0)
LYMPHS PCT: 31 %
MCH: 32.6 pg (ref 26.0–34.0)
MCHC: 32.9 g/dL (ref 30.0–36.0)
MCV: 99.1 fL (ref 78.0–100.0)
MONO ABS: 0.9 10*3/uL (ref 0.1–1.0)
Monocytes Relative: 9 %
NEUTROS ABS: 5.5 10*3/uL (ref 1.7–7.7)
Neutrophils Relative %: 57 %
Platelets: 312 10*3/uL (ref 150–400)
RBC: 4.26 MIL/uL (ref 3.87–5.11)
RDW: 12.3 % (ref 11.5–15.5)
WBC: 9.8 10*3/uL (ref 4.0–10.5)

## 2015-12-04 NOTE — ED Provider Notes (Signed)
[place] DEPT [facility] Provider Note   CSN: [id number] Arrival date & time: 12/04/15  1815  First Provider Contact:   First MD Initiated Contact with Patient 12/04/15 1956      By signing my name below, I, Soijett Blue, attest that this documentation has been prepared under the direction and in the presence of Domenic Moras, PA-C Electronically Signed: [place], ED Scribe. 12/04/15. 8:04 PM.   History   Chief Complaint Chief Complaint  Patient presents with  . Numbness    HPI Karen Pham is a 40 y.o. female with a PMHx of liver cirrhosis, who presents to the Emergency Department complaining of intermittent numbness to bilateral hands/feet onset 2 weeks. Pt notes that her numbness sensation last for a couple minutes an episode. Pt states that she is currently in [facility] and recovering from ETOH use. Pt reports that when she lived in [place], [place], she was informed that she may have liver cirrhosis. She states that she is having associated symptoms of tingling to bilateral hands and feet. She states that she has not tried any medications for the relief for her symptoms. She denies weakness, double vision, loss of vision, HA, fever, CP,  and any other symptoms. Pt notes that she stopped smoking cigarettes 1 month ago and she wears nicotine patches.    The history is provided by the patient. No language interpreter was used.    Past Medical History:  Diagnosis Date  . COPD (chronic obstructive pulmonary disease) ([place])   . Liver cirrhosis ([facility])     There are no active problems to display for this patient.   History reviewed. No pertinent surgical history.  OB History    No data available       Home Medications    Prior to Admission medications   Medication Sig Start Date End Date Taking? Authorizing Provider  albuterol (PROVENTIL HFA;VENTOLIN HFA) 108 (90 BASE) MCG/ACT inhaler Inhale 2 puffs into the lungs every 4 (four) hours as needed. For shortness of breath      Historical Provider, MD  atenolol (TENORMIN) 25 MG tablet Take by mouth daily.    Historical Provider, MD  beclomethasone (QVAR) 80 MCG/ACT inhaler Inhale into the lungs 2 (two) times daily.    Historical Provider, MD  busPIRone (BUSPAR) 30 MG tablet Take 30 mg by mouth 2 (two) times daily.    Historical Provider, MD  furosemide (LASIX) 20 MG tablet Take 1 tablet (20 mg total) by mouth daily. 11/03/15   Domenic Moras, PA-C  gabapentin (NEURONTIN) 300 MG capsule Take 300 mg by mouth 3 (three) times daily.    Historical Provider, MD  HydrOXYzine Pamoate (VISTARIL PO) Take by mouth.    Historical Provider, MD    Family History No family history on file.  Social History Social History  Substance Use Topics  . Smoking status: Current Every Day Smoker    Packs/day: 1.00    Types: Cigarettes  . Smokeless tobacco: Never Used  . Alcohol use 51.6 oz/week    86 Cans of beer per week     Allergies   Sulfa antibiotics   Review of Systems Review of Systems  Constitutional: Negative for fever.  Eyes: Negative for visual disturbance.  Cardiovascular: Negative for chest pain.  Skin: Negative for color change, rash and wound.  Neurological: Positive for numbness (bilateral hands and feet). Negative for headaches.       Tingling to bilateral hands and feet     Physical Exam Updated Vital Signs  BP 145/95   Pulse 73   Temp 98.5 F (36.9 C) (Oral)   Resp 20   Ht 5\' 7"  (1.702 m)   Wt 125 lb (56.7 kg)   LMP 11/06/2015   SpO2 100%   BMI 19.58 kg/m   Physical Exam  Constitutional: She is oriented to person, place, and time. She appears well-developed and well-nourished. No distress.  HENT:  Head: Normocephalic and atraumatic.  Eyes: EOM are normal.  Neck: Neck supple.  Cardiovascular: Normal rate, regular rhythm and normal heart sounds.  Exam reveals no gallop and no friction rub.   No murmur heard. Pulses:      Radial pulses are 2+ on the right side, and 2+ on the left side.  Radial  pulses 2+. Pedal pulses palpable bilaterally.  Pulmonary/Chest: Effort normal and breath sounds normal. No respiratory distress. She has no wheezes. She has no rales.  Abdominal: Soft. She exhibits no distension. There is no hepatomegaly. There is no tenderness.  No hepatomegaly.   Musculoskeletal: Normal range of motion.  Neurological: She is alert and oriented to person, place, and time. She has normal strength.  Patellar DTR's intact bilaterally. No foot drop. Grip strength equal to bilateral upper extremities.   Skin: Skin is warm and dry.  Brisk cap refill to all fingers and bilateral feet.   Psychiatric: She has a normal mood and affect. Her behavior is normal.  Nursing note and vitals reviewed.    ED Treatments / Results  DIAGNOSTIC STUDIES: Oxygen Saturation is 100% on RA, nl by my interpretation.    COORDINATION OF CARE: 8:04 PM Discussed treatment plan with pt at bedside which includes labs and pt agreed to plan.   Labs (all labs ordered are listed, but only abnormal results are displayed) Labs Reviewed  COMPREHENSIVE METABOLIC PANEL - Abnormal; Notable for the following:       Result Value   ALT 12 (*)    All other components within normal limits  CBC WITH DIFFERENTIAL/PLATELET    EKG  EKG Interpretation None       Radiology No results found.  Procedures Procedures (including critical care time)  Medications Ordered in ED Medications - No data to display   Initial Impression / Assessment and Plan / ED Course  I have reviewed the triage vital signs and the nursing notes.  Pertinent labs & imaging results that were available during my care of the patient were reviewed by me and considered in my medical decision making (see chart for details).  Clinical Course    BP 151/84 (BP Location: Right Arm)   Pulse 65   Temp 98.5 F (36.9 C) (Oral)   Resp 16   Ht 5\' 7"  (1.702 m)   Wt 56.7 kg   LMP 11/06/2015   SpO2 100%   BMI 19.58 kg/m    Final  Clinical Impressions(s) / ED Diagnoses   Final diagnoses:  Paresthesia    New Prescriptions New Prescriptions   No medications on file   I personally performed the services described in this documentation, which was scribed in my presence. The recorded information has been reviewed and is accurate.     9:11 PM Pt here with intermittent tingling sensation to her extremities.  No appreciable weakness or loss of sensation on exam.  She is NVI.  She has hx of liver cirrhosis and alcohol abuse.  Her labs are reassuring.  I encourage pt to f/u with PCP for further care.  Doubt MS, stroke, or  other acute emergent pathology on today's ER visit.  Return precaution discussed.    Domenic Moras, PA-C 12/04/15 2115    Tanna Furry, MD 12/16/15 [phone]

## 2015-12-04 NOTE — ED Triage Notes (Addendum)
States she is a pt at [facility]. Has been having numbness in her extremities on and off for several weeks. Ambulatory no distress.

## 2015-12-04 NOTE — ED Notes (Signed)
Pt verbalizes understanding of d/c instructions and denies any further need at this time. 

## 2015-12-04 NOTE — Discharge Instructions (Signed)
Please use number provided in this discharge paper to find a primary care provider for further evaluation and management of your health.

## 2017-11-13 DIAGNOSIS — F1020 Alcohol dependence, uncomplicated: Secondary | ICD-10-CM | POA: Insufficient documentation

## 2017-11-14 DIAGNOSIS — F1220 Cannabis dependence, uncomplicated: Secondary | ICD-10-CM

## 2017-11-14 HISTORY — DX: Cannabis dependence, uncomplicated: F12.20

## 2018-01-28 DIAGNOSIS — J4540 Moderate persistent asthma, uncomplicated: Secondary | ICD-10-CM | POA: Insufficient documentation

## 2018-01-28 DIAGNOSIS — Z720 Tobacco use: Secondary | ICD-10-CM | POA: Insufficient documentation

## 2018-05-11 IMAGING — DX DG CHEST 2V
2 series · 2 of 2 positions shown · non-contrast
Comparison: September 03, 2010

CLINICAL DATA: Lower extremity edema for 2 days

EXAM:
CHEST  2 VIEW

[chest pa]
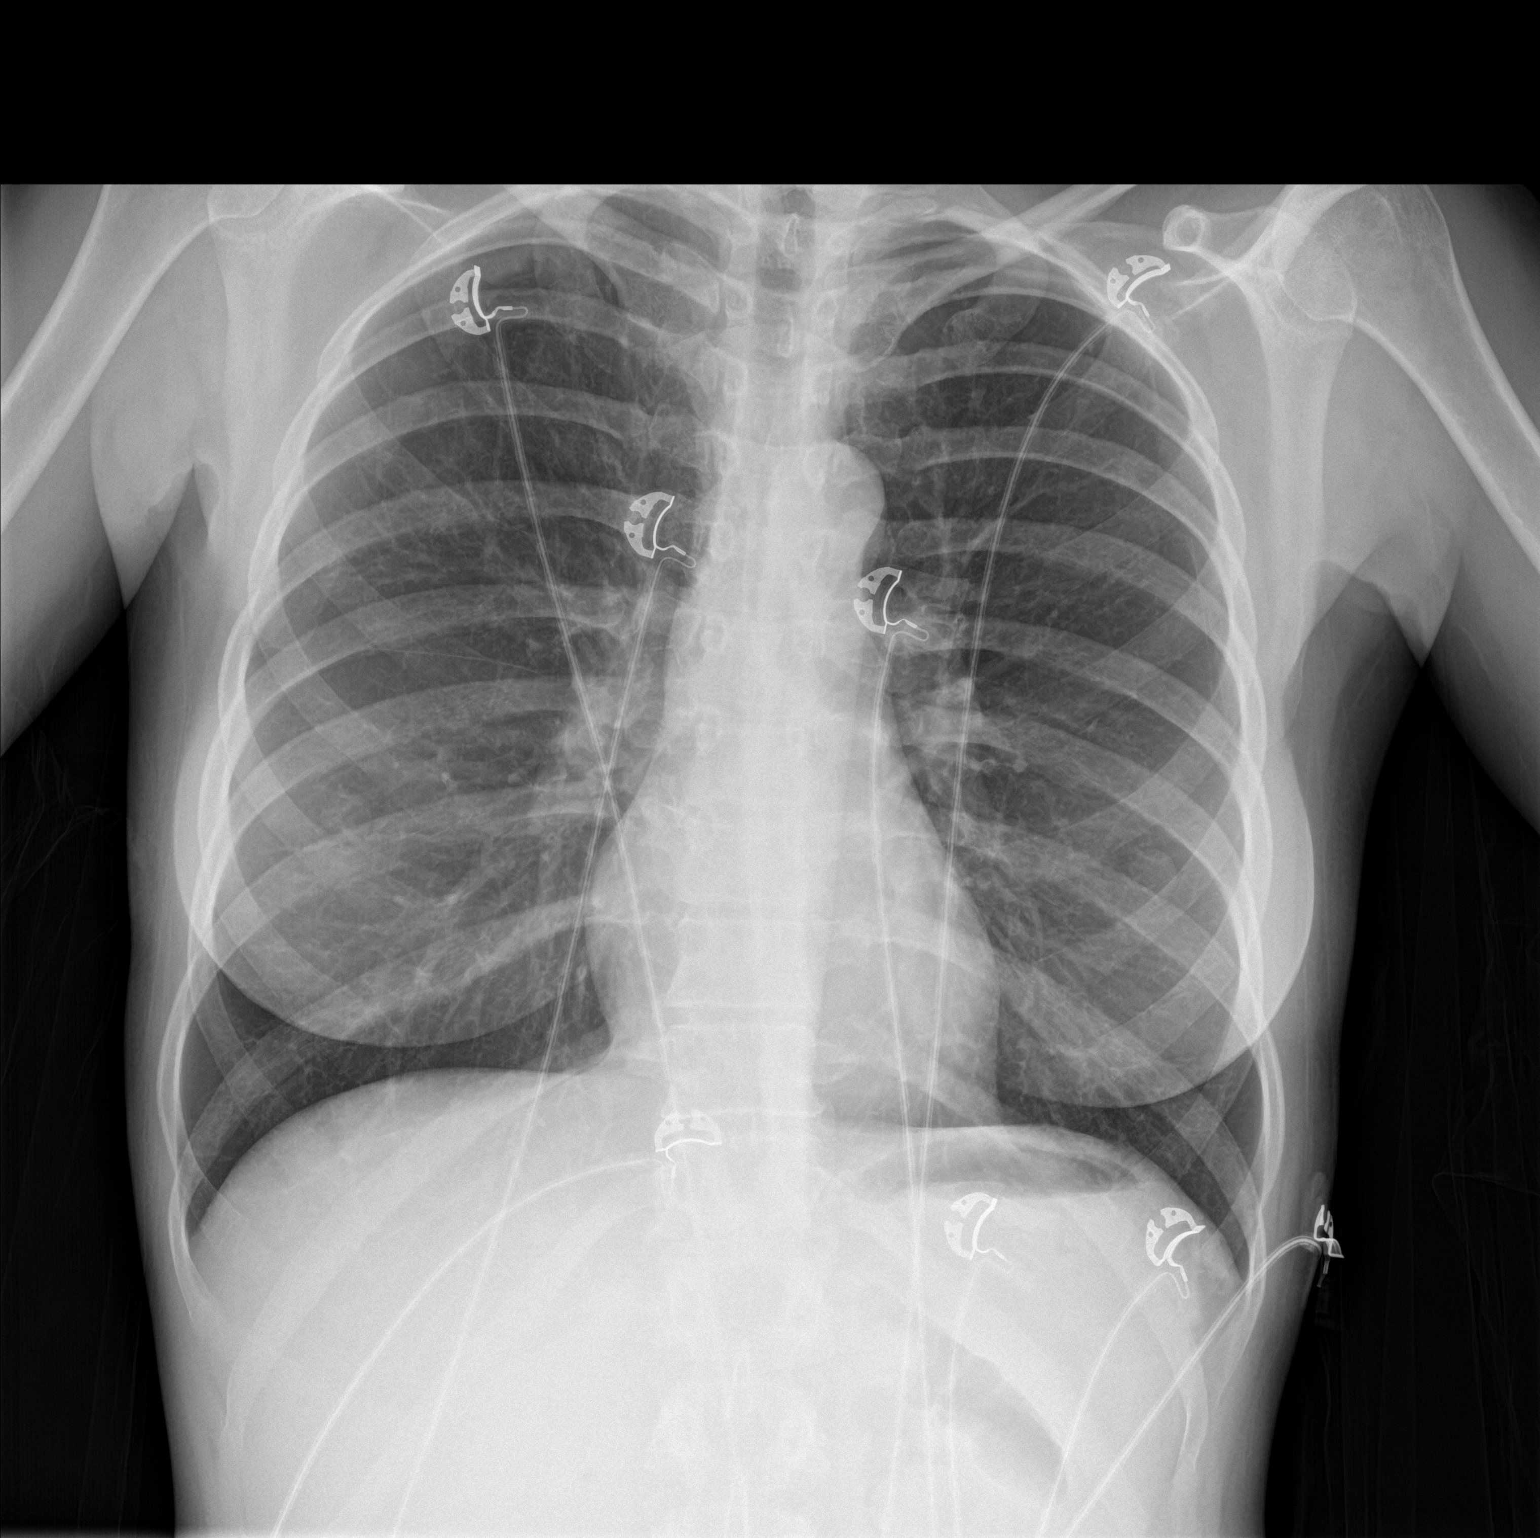

[chest lat]
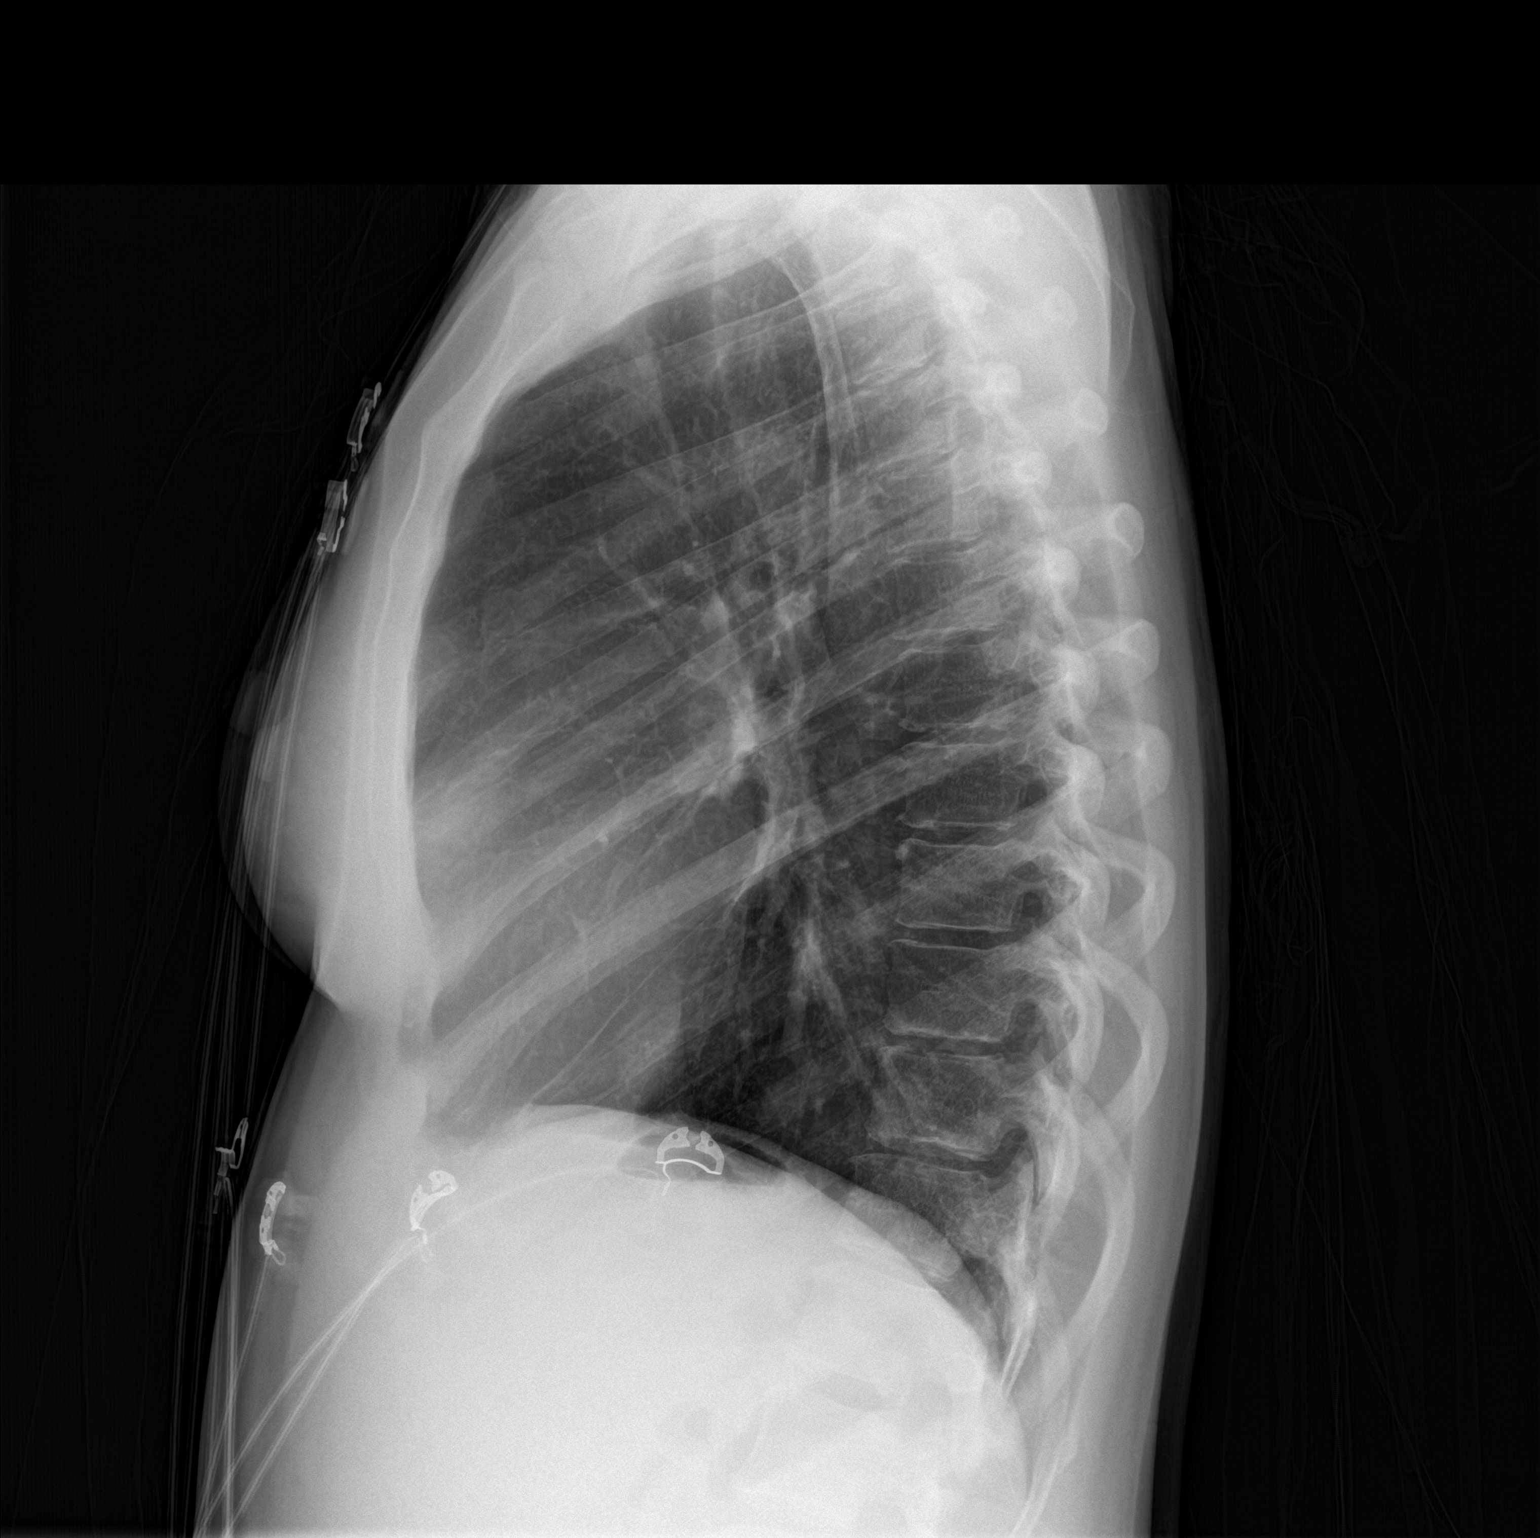

[2 of 2 positions shown; findings below may reference images not displayed]

FINDINGS: Lungs are clear. Heart size and pulmonary vascularity are normal. No
adenopathy. No bone lesions.
IMPRESSION: No edema or consolidation.

## 2019-02-10 DIAGNOSIS — N63 Unspecified lump in unspecified breast: Secondary | ICD-10-CM | POA: Insufficient documentation

## 2019-02-10 DIAGNOSIS — B370 Candidal stomatitis: Secondary | ICD-10-CM | POA: Insufficient documentation

## 2021-01-29 DIAGNOSIS — J449 Chronic obstructive pulmonary disease, unspecified: Secondary | ICD-10-CM | POA: Insufficient documentation

## 2021-01-29 DIAGNOSIS — J441 Chronic obstructive pulmonary disease with (acute) exacerbation: Secondary | ICD-10-CM | POA: Insufficient documentation

## 2021-04-04 ENCOUNTER — Other Ambulatory Visit: Payer: Self-pay

## 2021-04-04 ENCOUNTER — Encounter: Payer: Self-pay | Admitting: Physician Assistant

## 2021-04-04 ENCOUNTER — Ambulatory Visit: Payer: Self-pay | Attending: Physician Assistant | Admitting: Physician Assistant

## 2021-04-04 ENCOUNTER — Telehealth: Payer: Self-pay | Admitting: Physician Assistant

## 2021-04-04 VITALS — BP 138/90 | HR 75 | Resp 16 | Wt 125.4 lb

## 2021-04-04 DIAGNOSIS — E785 Hyperlipidemia, unspecified: Secondary | ICD-10-CM

## 2021-04-04 DIAGNOSIS — Z09 Encounter for follow-up examination after completed treatment for conditions other than malignant neoplasm: Secondary | ICD-10-CM

## 2021-04-04 DIAGNOSIS — I10 Essential (primary) hypertension: Secondary | ICD-10-CM

## 2021-04-04 DIAGNOSIS — J9801 Acute bronchospasm: Secondary | ICD-10-CM

## 2021-04-04 DIAGNOSIS — D649 Anemia, unspecified: Secondary | ICD-10-CM

## 2021-04-04 DIAGNOSIS — F418 Other specified anxiety disorders: Secondary | ICD-10-CM

## 2021-04-04 DIAGNOSIS — R202 Paresthesia of skin: Secondary | ICD-10-CM

## 2021-04-04 DIAGNOSIS — F1910 Other psychoactive substance abuse, uncomplicated: Secondary | ICD-10-CM

## 2021-04-04 DIAGNOSIS — R350 Frequency of micturition: Secondary | ICD-10-CM

## 2021-04-04 DIAGNOSIS — Z1331 Encounter for screening for depression: Secondary | ICD-10-CM

## 2021-04-04 LAB — POCT URINALYSIS DIP (CLINITEK)
Bilirubin, UA: NEGATIVE
Blood, UA: NEGATIVE
Glucose, UA: NEGATIVE mg/dL
Ketones, POC UA: NEGATIVE mg/dL
Leukocytes, UA: NEGATIVE
Nitrite, UA: NEGATIVE
POC PROTEIN,UA: NEGATIVE
Spec Grav, UA: >=1.03 — AB (ref 1.010–1.025)
Urobilinogen, UA: 0.2 E.U./dL
pH, UA: 5.5 (ref 5.0–8.0)

## 2021-04-04 MED ORDER — ASPIRIN 81 MG PO CHEW
81.0000 mg | CHEWABLE_TABLET | Freq: Every day | ORAL | 1 refills | Status: DC
  Filled 2021-04-04: qty 90, 90d supply, fill #0

## 2021-04-04 MED ORDER — FLUTICASONE PROPIONATE HFA 44 MCG/ACT IN AERO
2.0000 | INHALATION_SPRAY | Freq: Two times a day (BID) | RESPIRATORY_TRACT | 3 refills | Status: DC
  Filled 2021-04-04: qty 10.6, 30d supply, fill #0

## 2021-04-04 MED ORDER — LISINOPRIL-HYDROCHLOROTHIAZIDE 10-12.5 MG PO TABS
1.0000 | ORAL_TABLET | Freq: Every day | ORAL | 3 refills | Status: DC
Start: 2021-04-04 — End: 2021-05-23
  Filled 2021-04-04: qty 30, 30d supply, fill #0
  Filled 2021-05-02: qty 30, 30d supply, fill #1

## 2021-04-04 MED ORDER — GABAPENTIN 100 MG PO CAPS
100.0000 mg | ORAL_CAPSULE | Freq: Three times a day (TID) | ORAL | 3 refills | Status: DC
  Filled 2021-04-04: qty 90, 30d supply, fill #0
  Filled 2021-05-02: qty 90, 30d supply, fill #1

## 2021-04-04 MED ORDER — ATORVASTATIN CALCIUM 40 MG PO TABS
40.0000 mg | ORAL_TABLET | Freq: Every day | ORAL | 1 refills | Status: DC
  Filled 2021-04-04: qty 30, 30d supply, fill #0
  Filled 2021-05-02: qty 30, 30d supply, fill #1

## 2021-04-04 MED ORDER — AMLODIPINE BESYLATE 10 MG PO TABS
10.0000 mg | ORAL_TABLET | Freq: Every day | ORAL | 1 refills | Status: DC
  Filled 2021-04-04: qty 30, 30d supply, fill #0
  Filled 2021-05-02: qty 30, 30d supply, fill #1

## 2021-04-04 MED ORDER — ALBUTEROL SULFATE HFA 108 (90 BASE) MCG/ACT IN AERS
2.0000 | INHALATION_SPRAY | Freq: Three times a day (TID) | RESPIRATORY_TRACT | 2 refills | Status: DC | PRN
  Filled 2021-04-04: qty 18, 33d supply, fill #0

## 2021-04-04 MED ORDER — HYDROXYZINE HCL 25 MG PO TABS
25.0000 mg | ORAL_TABLET | Freq: Three times a day (TID) | ORAL | 3 refills | Status: DC | PRN
  Filled 2021-04-04: qty 30, 10d supply, fill #0
  Filled 2021-05-02: qty 30, 10d supply, fill #1

## 2021-04-04 MED ORDER — QUETIAPINE FUMARATE 50 MG PO TABS
50.0000 mg | ORAL_TABLET | Freq: Every day | ORAL | 3 refills | Status: DC
  Filled 2021-04-04: qty 30, 30d supply, fill #0
  Filled 2021-05-02: qty 30, 30d supply, fill #1

## 2021-04-04 MED ORDER — SERTRALINE HCL 50 MG PO TABS
50.0000 mg | ORAL_TABLET | Freq: Every day | ORAL | 3 refills | Status: DC
Start: 2021-04-04 — End: 2021-05-15
  Filled 2021-04-04: qty 30, 30d supply, fill #0
  Filled 2021-05-02: qty 30, 30d supply, fill #1

## 2021-04-04 NOTE — Progress Notes (Signed)
DARK

## 2021-04-04 NOTE — Progress Notes (Signed)
Patient ID: Karen Pham, female   DOB: 14-Jun-1975, 45 y.o.   MRN: [id number]   Karen Pham, is a 45 y.o. female  [id number]  [id number]  DOB - 08/27/1975  Chief Complaint  Patient presents with   Medication Refill       Subjective:   Karen Pham is a 45 y.o. female here today for a follow up visit after being seen in the ED 02/05/2021 for CP/elevated troponin, +cocaine use, +alcohol, and THC.  She then went to [facility] for treatment and is now living in a sober living facility.  She has her updated med list with her.  and to establish care. She needs RF of all her meds.  Med list updated.  She has an appt for counseling with [facility] in late January.  She has anxiety/depression and on new regimen.  Denies SI/HI.  She is only going to 2 recovery meetings a week.  She is smoking 1/2 ppd   Patient has No headache, No chest pain, No abdominal pain - No Nausea, No Cough - SOB.  Problem  Paresthesia  Hypertension  Substance Abuse ([facility])  Hyperlipidemia  Positive Depression Screening  Anemia    ALLERGIES: Allergies  Allergen Reactions   Sulfa Antibiotics Hives    PAST MEDICAL HISTORY: Past Medical History:  Diagnosis Date   COPD (chronic obstructive pulmonary disease) ([place])    Liver cirrhosis ([place])     MEDICATIONS AT HOME: Prior to Admission medications   Medication Sig Start Date End Date Taking? Authorizing Provider  lisinopril-hydrochlorothiazide (ZESTORETIC) 10-12.5 MG tablet Take 1 tablet by mouth daily. 04/04/21  Yes Shalini Mair M, PA-C  QUEtiapine (SEROQUEL) 50 MG tablet Take 1 tablet (50 mg total) by mouth at bedtime. 04/04/21  Yes Freeman Caldron M, PA-C  sertraline (ZOLOFT) 50 MG tablet Take 1 tablet (50 mg total) by mouth daily. 04/04/21  Yes Kelin Borum M, PA-C  albuterol (VENTOLIN HFA) 108 (90 Base) MCG/ACT inhaler Inhale 2 puffs into the lungs 3 (three) times daily as needed. 04/04/21   Argentina Donovan, PA-C  amLODipine (NORVASC) 10 MG tablet Take 1  tablet (10 mg total) by mouth daily. 04/04/21   Argentina Donovan, PA-C  aspirin 81 MG chewable tablet Chew 1 tablet (81 mg total) by mouth daily. 04/04/21   Argentina Donovan, PA-C  atorvastatin (LIPITOR) 40 MG tablet Take 1 tablet (40 mg total) by mouth daily. 04/04/21   Argentina Donovan, PA-C  fluticasone (FLOVENT HFA) 44 MCG/ACT inhaler Inhale 2 puffs into the lungs in the morning and at bedtime. 04/04/21   Argentina Donovan, PA-C  gabapentin (NEURONTIN) 100 MG capsule Take 1 capsule (100 mg total) by mouth 3 (three) times daily. 04/04/21   Argentina Donovan, PA-C  hydrOXYzine (ATARAX) 25 MG tablet Take 1 tablet (25 mg total) by mouth 3 (three) times daily as needed. 04/04/21   Kamauri Kathol, Dionne Bucy, PA-C    ROS: Neg HEENT Neg resp Neg cardiac Neg GI Neg GU Neg MS Neg psych Neg neuro  Objective:   Vitals:   04/04/21 1101  BP: 138/90  Pulse: 75  Resp: 16  SpO2: 97%  Weight: 125 lb 6.4 oz (56.9 kg)   Exam General appearance : Awake, alert, not in any distress. Speech pressured, appears anxious. Not toxic looking HEENT: Atraumatic and Normocephalic Neck: Supple, no JVD. No cervical lymphadenopathy.  Chest: Good air entry bilaterally, CTAB.  No rales/rhonchi/wheezing CVS: S1 S2 regular, no murmurs.  Abdomen: Bowel sounds present, Non tender  and not distended with no gaurding, rigidity or rebound. Extremities: B/L Lower Ext shows no edema, both legs are warm to touch Neurology: Awake alert, and oriented X 3, CN II-XII intact, Non focal Skin: No Rash  Data Review No results found for: HGBA1C  Assessment & Plan   1. Paresthesia - gabapentin (NEURONTIN) 100 MG capsule; Take 1 capsule (100 mg total) by mouth 3 (three) times daily.  Dispense: 90 capsule; Refill: 3 - Thyroid Panel With TSH  2. Hypertension, unspecified type Controlled today - amLODipine (NORVASC) 10 MG tablet; Take 1 tablet (10 mg total) by mouth daily.  Dispense: 90 tablet; Refill: 1 - aspirin 81 MG chewable  tablet; Chew 1 tablet (81 mg total) by mouth daily.  Dispense: 90 tablet; Refill: 1 - lisinopril-hydrochlorothiazide (ZESTORETIC) 10-12.5 MG tablet; Take 1 tablet by mouth daily.  Dispense: 90 tablet; Refill: 3  3. Substance abuse ([place]) [place].org is the website for narcotics anonymous  [place] (website) or 562 223 7910 is the information for alcoholics anonymous  Both are free and immediately available for help with alcohol and drug use I have counseled the patient at length about substance abuse and addiction.  12 step meetings/recovery recommended.  Local 12 step meeting lists were given and attendance was encouraged.  Patient expresses understanding.    4. Hospital discharge follow-up  5. Hypomagnesemia - Magnesium  6. Anemia, unspecified type Was likely alcohol related bc MCV was elevated  7. Depression with anxiety F/up BH.  I placed a message to Asante McCoy to f/up with patient - hydrOXYzine (ATARAX) 25 MG tablet; Take 1 tablet (25 mg total) by mouth 3 (three) times daily as needed.  Dispense: 30 tablet; Refill: 3 - sertraline (ZOLOFT) 50 MG tablet; Take 1 tablet (50 mg total) by mouth daily.  Dispense: 30 tablet; Refill: 3 - QUEtiapine (SEROQUEL) 50 MG tablet; Take 1 tablet (50 mg total) by mouth at bedtime.  Dispense: 30 tablet; Refill: 3  8. Positive depression screening - hydrOXYzine (ATARAX) 25 MG tablet; Take 1 tablet (25 mg total) by mouth 3 (three) times daily as needed.  Dispense: 30 tablet; Refill: 3 - sertraline (ZOLOFT) 50 MG tablet; Take 1 tablet (50 mg total) by mouth daily.  Dispense: 30 tablet; Refill: 3 - QUEtiapine (SEROQUEL) 50 MG tablet; Take 1 tablet (50 mg total) by mouth at bedtime.  Dispense: 30 tablet; Refill: 3  9. Hyperlipidemia, unspecified hyperlipidemia type - atorvastatin (LIPITOR) 40 MG tablet; Take 1 tablet (40 mg total) by mouth daily.  Dispense: 90 tablet; Refill: 1  10. Urinary frequency Urine is neg - POCT URINALYSIS DIP  (CLINITEK)  11. Bronchospasm - albuterol (VENTOLIN HFA) 108 (90 Base) MCG/ACT inhaler; Inhale 2 puffs into the lungs 3 (three) times daily as needed.  Dispense: 18 g; Refill: 2 - fluticasone (FLOVENT HFA) 44 MCG/ACT inhaler; Inhale 2 puffs into the lungs in the morning and at bedtime.  Dispense: 10.6 g; Refill: 3  Patient have been counseled extensively about nutrition and exercise. Other issues discussed during this visit include: low cholesterol diet, weight control and daily exercise, foot care, annual eye examinations at Ophthalmology, importance of adherence with medications and regular follow-up. We also discussed long term complications of uncontrolled diabetes and hypertension.   Return in about 6 weeks (around 05/16/2021) for assign PCP.  The patient was given clear instructions to go to ER or return to medical center if symptoms don't improve, worsen or new problems develop. The patient verbalized understanding. The patient was told to call  to get lab results if they haven't heard anything in the next week.      Freeman Caldron, PA-C [facility] [place], [place]   04/04/2021, 1:04 PM

## 2021-04-05 LAB — MAGNESIUM: Magnesium: 1.7 mg/dL (ref 1.6–2.3)

## 2021-04-05 LAB — THYROID PANEL WITH TSH
Free Thyroxine Index: 2.1 (ref 1.2–4.9)
T3 Uptake Ratio: 31 % (ref 24–39)
T4, Total: 6.7 ug/dL (ref 4.5–12.0)
TSH: 2.04 u[IU]/mL (ref 0.450–4.500)

## 2021-04-25 ENCOUNTER — Other Ambulatory Visit: Payer: Self-pay | Admitting: Physician Assistant

## 2021-04-25 DIAGNOSIS — R202 Paresthesia of skin: Secondary | ICD-10-CM

## 2021-04-25 DIAGNOSIS — M79643 Pain in unspecified hand: Secondary | ICD-10-CM

## 2021-04-27 NOTE — Progress Notes (Signed)
Called pt left VM if additional question to reach back. Call back number and name provided

## 2021-05-02 ENCOUNTER — Other Ambulatory Visit: Payer: Self-pay

## 2021-05-03 ENCOUNTER — Other Ambulatory Visit: Payer: Self-pay

## 2021-05-10 ENCOUNTER — Ambulatory Visit: Payer: Self-pay | Admitting: Orthopedic Surgery

## 2021-05-15 ENCOUNTER — Encounter (HOSPITAL_COMMUNITY): Payer: Self-pay | Admitting: Licensed Clinical Social Worker

## 2021-05-15 ENCOUNTER — Ambulatory Visit (INDEPENDENT_AMBULATORY_CARE_PROVIDER_SITE_OTHER): Payer: No Payment, Other | Admitting: Psychiatry

## 2021-05-15 ENCOUNTER — Other Ambulatory Visit: Payer: Self-pay

## 2021-05-15 ENCOUNTER — Ambulatory Visit (INDEPENDENT_AMBULATORY_CARE_PROVIDER_SITE_OTHER): Payer: No Payment, Other | Admitting: Licensed Clinical Social Worker

## 2021-05-15 ENCOUNTER — Encounter (HOSPITAL_COMMUNITY): Payer: Self-pay | Admitting: Psychiatry

## 2021-05-15 DIAGNOSIS — F418 Other specified anxiety disorders: Secondary | ICD-10-CM | POA: Diagnosis not present

## 2021-05-15 DIAGNOSIS — F4323 Adjustment disorder with mixed anxiety and depressed mood: Secondary | ICD-10-CM | POA: Diagnosis not present

## 2021-05-15 MED ORDER — QUETIAPINE FUMARATE 50 MG PO TABS
50.0000 mg | ORAL_TABLET | Freq: Every day | ORAL | 3 refills | Status: DC
  Filled 2021-05-15 – 2021-06-04 (×2): qty 30, 30d supply, fill #0
  Filled 2021-07-05: qty 30, 30d supply, fill #1
  Filled 2021-08-06: qty 30, 30d supply, fill #2

## 2021-05-15 MED ORDER — SERTRALINE HCL 50 MG PO TABS
50.0000 mg | ORAL_TABLET | Freq: Every day | ORAL | 3 refills | Status: DC
  Filled 2021-05-15: qty 30, 30d supply, fill #0

## 2021-05-15 MED ORDER — HYDROXYZINE HCL 25 MG PO TABS
25.0000 mg | ORAL_TABLET | Freq: Three times a day (TID) | ORAL | 3 refills | Status: DC | PRN
  Filled 2021-05-15 – 2021-05-24 (×2): qty 30, 10d supply, fill #0
  Filled 2021-06-17: qty 30, 10d supply, fill #1
  Filled 2021-07-02: qty 30, 10d supply, fill #2
  Filled 2021-07-25: qty 30, 10d supply, fill #3
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
## 2021-05-15 NOTE — Plan of Care (Signed)
Pt agreeable to plan  ?

## 2021-05-21 ENCOUNTER — Other Ambulatory Visit: Payer: Self-pay

## 2021-05-23 ENCOUNTER — Other Ambulatory Visit: Payer: Self-pay

## 2021-05-23 ENCOUNTER — Ambulatory Visit: Payer: Self-pay | Attending: Critical Care Medicine | Admitting: Critical Care Medicine

## 2021-05-23 ENCOUNTER — Encounter: Payer: Self-pay | Admitting: Critical Care Medicine

## 2021-05-23 VITALS — BP 147/88 | HR 65 | Resp 16 | Wt 130.2 lb

## 2021-05-23 DIAGNOSIS — Z1159 Encounter for screening for other viral diseases: Secondary | ICD-10-CM | POA: Insufficient documentation

## 2021-05-23 DIAGNOSIS — F1020 Alcohol dependence, uncomplicated: Secondary | ICD-10-CM

## 2021-05-23 DIAGNOSIS — E785 Hyperlipidemia, unspecified: Secondary | ICD-10-CM

## 2021-05-23 DIAGNOSIS — J9801 Acute bronchospasm: Secondary | ICD-10-CM

## 2021-05-23 DIAGNOSIS — J4540 Moderate persistent asthma, uncomplicated: Secondary | ICD-10-CM

## 2021-05-23 DIAGNOSIS — K056 Periodontal disease, unspecified: Secondary | ICD-10-CM

## 2021-05-23 DIAGNOSIS — D649 Anemia, unspecified: Secondary | ICD-10-CM

## 2021-05-23 DIAGNOSIS — Z720 Tobacco use: Secondary | ICD-10-CM

## 2021-05-23 DIAGNOSIS — Z1211 Encounter for screening for malignant neoplasm of colon: Secondary | ICD-10-CM | POA: Insufficient documentation

## 2021-05-23 DIAGNOSIS — F4323 Adjustment disorder with mixed anxiety and depressed mood: Secondary | ICD-10-CM

## 2021-05-23 DIAGNOSIS — J449 Chronic obstructive pulmonary disease, unspecified: Secondary | ICD-10-CM

## 2021-05-23 DIAGNOSIS — N63 Unspecified lump in unspecified breast: Secondary | ICD-10-CM

## 2021-05-23 DIAGNOSIS — Z1331 Encounter for screening for depression: Secondary | ICD-10-CM

## 2021-05-23 DIAGNOSIS — F418 Other specified anxiety disorders: Secondary | ICD-10-CM

## 2021-05-23 DIAGNOSIS — F1910 Other psychoactive substance abuse, uncomplicated: Secondary | ICD-10-CM

## 2021-05-23 DIAGNOSIS — J441 Chronic obstructive pulmonary disease with (acute) exacerbation: Secondary | ICD-10-CM

## 2021-05-23 DIAGNOSIS — F1220 Cannabis dependence, uncomplicated: Secondary | ICD-10-CM

## 2021-05-23 DIAGNOSIS — Z114 Encounter for screening for human immunodeficiency virus [HIV]: Secondary | ICD-10-CM | POA: Insufficient documentation

## 2021-05-23 DIAGNOSIS — R202 Paresthesia of skin: Secondary | ICD-10-CM

## 2021-05-23 DIAGNOSIS — B370 Candidal stomatitis: Secondary | ICD-10-CM

## 2021-05-23 DIAGNOSIS — I10 Essential (primary) hypertension: Secondary | ICD-10-CM

## 2021-05-23 MED ORDER — ALBUTEROL SULFATE HFA 108 (90 BASE) MCG/ACT IN AERS
2.0000 | INHALATION_SPRAY | Freq: Three times a day (TID) | RESPIRATORY_TRACT | 2 refills | Status: DC | PRN
  Filled 2021-05-23: qty 18, 33d supply, fill #0
  Filled 2021-08-15: qty 18, 33d supply, fill #1

## 2021-05-23 MED ORDER — GABAPENTIN 300 MG PO CAPS
300.0000 mg | ORAL_CAPSULE | Freq: Every day | ORAL | 3 refills | Status: DC
  Filled 2021-05-23: qty 30, 30d supply, fill #0
  Filled 2021-06-17: qty 30, 30d supply, fill #1

## 2021-05-23 MED ORDER — FLUTICASONE PROPIONATE HFA 44 MCG/ACT IN AERO
2.0000 | INHALATION_SPRAY | Freq: Two times a day (BID) | RESPIRATORY_TRACT | 3 refills | Status: DC
  Filled 2021-05-23: qty 10.6, 30d supply, fill #0
  Filled 2021-07-09: qty 10.6, 30d supply, fill #1

## 2021-05-23 MED ORDER — VALSARTAN 160 MG PO TABS
160.0000 mg | ORAL_TABLET | Freq: Every day | ORAL | 3 refills | Status: DC
  Filled 2021-05-23: qty 30, 30d supply, fill #0
  Filled 2021-06-26 – 2021-07-04 (×2): qty 30, 30d supply, fill #1
  Filled 2021-08-06: qty 30, 30d supply, fill #2

## 2021-05-23 MED ORDER — SERTRALINE HCL 50 MG PO TABS
50.0000 mg | ORAL_TABLET | Freq: Every day | ORAL | 3 refills | Status: DC
  Filled 2021-05-23 – 2021-06-04 (×2): qty 30, 30d supply, fill #0
  Filled 2021-07-05: qty 30, 30d supply, fill #1
  Filled 2021-08-06: qty 30, 30d supply, fill #2

## 2021-05-23 MED ORDER — AMLODIPINE BESYLATE 10 MG PO TABS
10.0000 mg | ORAL_TABLET | Freq: Every day | ORAL | 1 refills | Status: DC
  Filled 2021-05-23 – 2021-06-04 (×2): qty 90, 90d supply, fill #0

## 2021-05-23 MED ORDER — ATORVASTATIN CALCIUM 40 MG PO TABS
40.0000 mg | ORAL_TABLET | Freq: Every day | ORAL | 1 refills | Status: DC
  Filled 2021-05-23: qty 90, 90d supply, fill #0
  Filled 2021-06-04: qty 30, 30d supply, fill #0
  Filled 2021-07-09: qty 30, 30d supply, fill #1
  Filled 2021-08-06: qty 30, 30d supply, fill #2

## 2021-05-23 NOTE — Progress Notes (Signed)
Patient states that at the end of the day(after work) she in a lot of pain that its hard to walk.

## 2021-05-23 NOTE — Assessment & Plan Note (Addendum)
BP not a goal, still  smoking Discontinue lisinopril-HCTZ due to being unable to take due to side effects. Start valsartan 160 mg and continue with amlodipine as prescribed.

## 2021-05-23 NOTE — Assessment & Plan Note (Addendum)
Patient has been sober for 90 days and is committed to recovery. Continue with support groups and follow up with behavioral health as planned. CBC and CMP ordered today to assess for anemias and liver function.

## 2021-05-23 NOTE — Assessment & Plan Note (Signed)
FOBT ordered.

## 2021-05-23 NOTE — Assessment & Plan Note (Addendum)
Continue working on smoking reduction and cessation as discussed.     Current smoking consumption amount: 1/2 PPD   Dicsussion on advise to quit smoking and smoking impacts: CV lung impacts   Patient's willingness to quit:  In sober living for subst use/ etoh will be a challenge   Methods to quit smoking discussed:  behav mod   Medication management of smoking session drugs discussed:none   Resources provided:  AVS    Setting quit date not established   Follow-up arranged 3 months   Time spent counseling the patient:  5 min

## 2021-05-23 NOTE — Assessment & Plan Note (Signed)
Continue medications as prescribed by behavioral health and continue follow up with behavioral health as planned.

## 2021-05-23 NOTE — Patient Instructions (Signed)
Start valsartan daily for blood pressure  Refills on medications sent to the pharmacy  Tetanus shot was given  Complete set of labs to be obtained today  A colon cancer screen was ordered  Return Dr Joya Gaskins 3 months

## 2021-05-23 NOTE — Assessment & Plan Note (Addendum)
Continue with smoking reduction/cessation efforts as discussed. Inhaler refills sent to pharmacy; continue using inhalers as directed.

## 2021-05-23 NOTE — Assessment & Plan Note (Signed)
Hepatitis C screening ordered. Patient will get this done at the lab today.

## 2021-05-23 NOTE — Assessment & Plan Note (Addendum)
Positive Phalen and Tinel on today's exam of the right hand raising suspicion for carpal tunnel of the right hand. Diminished sensation in hands and feet as well. Keep appointment with orthopedics scheduled for February for further workup/evaluation.   Refill gabapentin

## 2021-05-23 NOTE — Progress Notes (Deleted)
New Patient Office Visit  Subjective:  Patient ID: Karen Pham, female    DOB: 04-Jun-1975  Age: 46 y.o. MRN: [id number]  CC: No chief complaint on file.   HPI Karen Pham presents for  Pcv hiv hcv tdap pap colon flu  03/2021 Karen Pham is a 46 y.o. female here today for a follow up visit after being seen in the ED 02/05/2021 for CP/elevated troponin, +cocaine use, +alcohol, and THC.  She then went to [facility] for treatment and is now living in a sober living facility.  She has her updated med list with her.  and to establish care. She needs RF of all her meds.  Med list updated.  She has an appt for counseling with [facility] in late January.  She has anxiety/depression and on new regimen.  Denies SI/HI.  She is only going to 2 recovery meetings a week.  She is smoking 1/2 ppd    1. Paresthesia - gabapentin (NEURONTIN) 100 MG capsule; Take 1 capsule (100 mg total) by mouth 3 (three) times daily.  Dispense: 90 capsule; Refill: 3 - Thyroid Panel With TSH   2. Hypertension, unspecified type Controlled today - amLODipine (NORVASC) 10 MG tablet; Take 1 tablet (10 mg total) by mouth daily.  Dispense: 90 tablet; Refill: 1 - aspirin 81 MG chewable tablet; Chew 1 tablet (81 mg total) by mouth daily.  Dispense: 90 tablet; Refill: 1 - lisinopril-hydrochlorothiazide (ZESTORETIC) 10-12.5 MG tablet; Take 1 tablet by mouth daily.  Dispense: 90 tablet; Refill: 3   3. Substance abuse ([place]) [employer] [place]org is the website for narcotics anonymous   [place] (website) or 463-813-2946 is the information for alcoholics anonymous   Both are free and immediately available for help with alcohol and drug use I have counseled the patient at length about substance abuse and addiction.  12 step meetings/recovery recommended.  Local 12 step meeting lists were given and attendance was encouraged.  Patient expresses understanding.      4. Hospital discharge follow-up   5. Hypomagnesemia - Magnesium   6.  Anemia, unspecified type Was likely alcohol related bc MCV was elevated   7. Depression with anxiety F/up BH.  I placed a message to Asante McCoy to f/up with patient - hydrOXYzine (ATARAX) 25 MG tablet; Take 1 tablet (25 mg total) by mouth 3 (three) times daily as needed.  Dispense: 30 tablet; Refill: 3 - sertraline (ZOLOFT) 50 MG tablet; Take 1 tablet (50 mg total) by mouth daily.  Dispense: 30 tablet; Refill: 3 - QUEtiapine (SEROQUEL) 50 MG tablet; Take 1 tablet (50 mg total) by mouth at bedtime.  Dispense: 30 tablet; Refill: 3   8. Positive depression screening - hydrOXYzine (ATARAX) 25 MG tablet; Take 1 tablet (25 mg total) by mouth 3 (three) times daily as needed.  Dispense: 30 tablet; Refill: 3 - sertraline (ZOLOFT) 50 MG tablet; Take 1 tablet (50 mg total) by mouth daily.  Dispense: 30 tablet; Refill: 3 - QUEtiapine (SEROQUEL) 50 MG tablet; Take 1 tablet (50 mg total) by mouth at bedtime.  Dispense: 30 tablet; Refill: 3   9. Hyperlipidemia, unspecified hyperlipidemia type - atorvastatin (LIPITOR) 40 MG tablet; Take 1 tablet (40 mg total) by mouth daily.  Dispense: 90 tablet; Refill: 1   10. Urinary frequency Urine is neg - POCT URINALYSIS DIP (CLINITEK)   11. Bronchospasm - albuterol (VENTOLIN HFA) 108 (90 Base) MCG/ACT inhaler; Inhale 2 puffs into the lungs 3 (three) times daily as needed.  Dispense: 18  g; Refill: 2 - fluticasone (FLOVENT HFA) 44 MCG/ACT inhaler; Inhale 2 puffs into the lungs in the morning and at bedtime.  Dispense: 10.6 g; Refill: 3 Past Medical History:  Diagnosis Date   COPD (chronic obstructive pulmonary disease) (HCC)    Liver cirrhosis (HCC)     No past surgical history on file.  No family history on file.  Social History   Socioeconomic History   Marital status: Single    Spouse name: Not on file   Number of children: Not on file   Years of education: Not on file   Highest education level: Not on file  Occupational History   Not on file   Tobacco Use   Smoking status: Every Day    Packs/day: 0.50    Types: Cigarettes   Smokeless tobacco: Never  Substance and Sexual Activity   Alcohol use: Yes    Alcohol/week: 86.0 standard drinks    Types: 86 Cans of beer per week   Drug use: Not Currently    Frequency: 1.0 times per week    Types: "Crack" cocaine, Cocaine, Other-see comments, Marijuana    Comment: 90 days sober [facility]   Sexual activity: Not Currently  Other Topics Concern   Not on file  Social History Narrative   Not on file   Social Determinants of Health   Financial Resource Strain: Medium Risk   Difficulty of Paying Living Expenses: Somewhat hard  Food Insecurity: Food Insecurity Present   Worried About Charity fundraiser in the Last Year: Sometimes true   Ran Out of Food in the Last Year: Sometimes true  Transportation Needs: No Transportation Needs   Lack of Transportation (Medical): No   Lack of Transportation (Non-Medical): No  Physical Activity: Sufficiently Active   Days of Exercise per Week: 5 days   Minutes of Exercise per Session: 60 min  Stress: Stress Concern Present   Feeling of Stress : Very much  Social Connections: Socially Isolated   Frequency of Communication with Friends and Family: Three times a week   Frequency of Social Gatherings with Friends and Family: Once a week   Attends Religious Services: Never   Marine scientist or Organizations: No   Attends Music therapist: Never   Marital Status: Never married  Human resources officer Violence: Not At Risk   Fear of Current or Ex-Partner: No   Emotionally Abused: No   Physically Abused: No   Sexually Abused: No    ROS Review of Systems  Objective:   Today's Vitals: There were no vitals taken for this visit.  Physical Exam  Assessment & Plan:   Problem List Items Addressed This Visit   None   Outpatient Encounter Medications as of 05/23/2021  Medication Sig   albuterol (VENTOLIN HFA) 108 (90 Base) MCG/ACT  inhaler Inhale 2 puffs into the lungs 3 (three) times daily as needed.   amLODipine (NORVASC) 10 MG tablet Take 1 tablet (10 mg total) by mouth daily.   aspirin 81 MG chewable tablet Chew 1 tablet (81 mg total) by mouth daily.   atorvastatin (LIPITOR) 40 MG tablet Take 1 tablet (40 mg total) by mouth daily.   fluticasone (FLOVENT HFA) 44 MCG/ACT inhaler Inhale 2 puffs into the lungs in the morning and at bedtime.   gabapentin (NEURONTIN) 100 MG capsule Take 1 capsule (100 mg total) by mouth 3 (three) times daily.   hydrOXYzine (ATARAX) 25 MG tablet Take 1 tablet (25 mg total) by mouth 3 (  three) times daily as needed.   lisinopril-hydrochlorothiazide (ZESTORETIC) 10-12.5 MG tablet Take 1 tablet by mouth daily.   QUEtiapine (SEROQUEL) 50 MG tablet Take 1 tablet (50 mg total) by mouth at bedtime.   sertraline (ZOLOFT) 50 MG tablet Take 1 tablet (50 mg total) by mouth daily.   No facility-administered encounter medications on file as of 05/23/2021.    Follow-up: No follow-ups on file.   Asencion Noble, MD

## 2021-05-23 NOTE — Assessment & Plan Note (Signed)
HIV screening ordered. Patient will have this done at lab today.

## 2021-05-23 NOTE — Assessment & Plan Note (Addendum)
Continue with atorvastatin. Re assess lipid panel, healthy diet discussed

## 2021-05-23 NOTE — Progress Notes (Addendum)
Established Patient Office Visit  Subjective:  Patient ID: Karen Pham, female    DOB: 1975-07-27  Age: 46 y.o. MRN: [id number]  CC:  Chief Complaint  Patient presents with   Numbness   Leg Pain   Blister    HPI Karen Pham presents for primary care follow up. She saw Freeman Caldron, PA-C at this practice on 04/04/2021 to establish care. Her medications were refilled at that time, and labs were ordered for thyroid and magnesium levels, both of which were normal. She has had ongoing numbness and weakness in the hands and feet. She was referred to orthopedics. She attempted to go to that appointment, but her insurance is not active yet and she was stuck with a high copay that she was unable to pay. She states she has signed up for health insurance through [employer] and this is set to begin on Feb 1. She has rescheduled her appointment with orthopedics to February and she is hoping she will be able to go then.  The patient's main concerns are regarding her leg/foot pain and numbness in her right hand and wrist. She said the foot pain has been going on for awhile but is getting worse, but the numbness in the right hand and wrist is new. She gets foot swelling and pain especially after work. She works at [employer] and is on her feet all day. The pain in her feet causes her to limp by the end of the day. She has a feeling of pins and needles in her right hand and wrist that will wake her up from sleep. She says this is making it difficult for her to hold objects which is frustrating for her. She tries moving her hands, stretching them in and out to try to get feeling back. This helps somewhat.  She also has a recurring "sore" on her upper gumline. She says that it will periodically swell, fill with pus, and then burst. She said it is not painful today. She says that she has gotten used to having a lot of mouth pain and only really notices when it is really bad. She would like to go to a dentist and  tried going to the [facility] clinic, but was told that they could not help her.   She is currently taking Lisinopril-HCTZ and amlodipine for her hypertension. She says she does not take the Lisinopril-HCTZ on days where she works because she says she does not like that it makes her urinate so frequently. Her BP remains slightly elevated today at 147/88.    She is currently living in sober living housing. She says her housing is safe and secure for now, but she would eventually like her own place. 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 She is not using any other illicit substances right now. She is still smoking, but she has decreased her smoking from just over 1 PPD to 1/2 PPD. She says she thinks this has helped her breathing. She also uses prescribed inhalers. She says her mood is stable right now, and she follows with [name], NP for behavioral health  She is due for routine health screenings. She states she has had a mammogram and pap smear in the past, but does not remember when or if there were any significant results. She also needs Hepatitis C and HIV screenings, and she plans on getting these done at the lab here today. She thinks she has  been screened for these before but does not remember when. She needs colon cancer screening, and is willing to do the FOBT kit. She is also interested in getting the tDAP vaccine today.   Past Medical History:  Diagnosis Date   COPD (chronic obstructive pulmonary disease) ([place])    Liver cirrhosis ([place])     History reviewed. No pertinent surgical history.  History reviewed. No pertinent family history.  Social History   Socioeconomic History   Marital status: Single    Spouse name: Not on file   Number of children: Not on file   Years of education: Not on file   Highest education level: Not on file  Occupational History   Not on file  Tobacco Use   Smoking status: Every Day    Packs/day:  0.50    Types: Cigarettes   Smokeless tobacco: Never  Substance and Sexual Activity   Alcohol use: Yes    Alcohol/week: 86.0 standard drinks    Types: 86 Cans of beer per week   Drug use: Not Currently    Frequency: 1.0 times per week    Types: "Crack" cocaine, Cocaine, Other-see comments, Marijuana    Comment: 90 days sober [facility]   Sexual activity: Not Currently  Other Topics Concern   Not on file  Social History Narrative   Not on file   Social Determinants of Health   Financial Resource Strain: Medium Risk   Difficulty of Paying Living Expenses: Somewhat hard  Food Insecurity: Food Insecurity Present   Worried About Charity fundraiser in the Last Year: Sometimes true   Ran Out of Food in the Last Year: Sometimes true  Transportation Needs: No Transportation Needs   Lack of Transportation (Medical): No   Lack of Transportation (Non-Medical): No  Physical Activity: Sufficiently Active   Days of Exercise per Week: 5 days   Minutes of Exercise per Session: 60 min  Stress: Stress Concern Present   Feeling of Stress : Very much  Social Connections: Socially Isolated   Frequency of Communication with Friends and Family: Three times a week   Frequency of Social Gatherings with Friends and Family: Once a week   Attends Religious Services: Never   Marine scientist or Organizations: No   Attends Music therapist: Never   Marital Status: Never married  Human resources officer Violence: Not At Risk   Fear of Current or Ex-Partner: No   Emotionally Abused: No   Physically Abused: No   Sexually Abused: No    Outpatient Medications Prior to Visit  Medication Sig Dispense Refill   hydrOXYzine (ATARAX) 25 MG tablet Take 1 tablet (25 mg total) by mouth 3 (three) times daily as needed. 30 tablet 3   QUEtiapine (SEROQUEL) 50 MG tablet Take 1 tablet (50 mg total) by mouth at bedtime. 30 tablet 3   albuterol (VENTOLIN HFA) 108 (90 Base) MCG/ACT inhaler Inhale 2 puffs into  the lungs 3 (three) times daily as needed. 18 g 2   amLODipine (NORVASC) 10 MG tablet Take 1 tablet (10 mg total) by mouth daily. 90 tablet 1   atorvastatin (LIPITOR) 40 MG tablet Take 1 tablet (40 mg total) by mouth daily. 90 tablet 1   gabapentin (NEURONTIN) 100 MG capsule Take 1 capsule (100 mg total) by mouth 3 (three) times daily. 90 capsule 3   hydrOXYzine (ATARAX) 50 MG tablet Take by mouth.     sertraline (ZOLOFT) 25 MG tablet Take 1 tablet by mouth daily.  aspirin 81 MG chewable tablet Chew 1 tablet (81 mg total) by mouth daily. (Patient not taking: Reported on 05/23/2021) 90 tablet 1   cyanocobalamin 100 MCG tablet Take 1 tablet by mouth daily. (Patient not taking: Reported on 05/23/2021)     esomeprazole (NEXIUM) 40 MG capsule Take by mouth. (Patient not taking: Reported on 05/23/2021)     fluticasone (FLOVENT HFA) 44 MCG/ACT inhaler Inhale 2 puffs into the lungs in the morning and at bedtime. (Patient not taking: Reported on 05/23/2021) 10.6 g 3   ipratropium-albuterol (DUONEB) 0.5-2.5 (3) MG/3ML SOLN Inhale into the lungs. (Patient not taking: Reported on 05/23/2021)     lisinopril-hydrochlorothiazide (ZESTORETIC) 10-12.5 MG tablet Take 1 tablet by mouth daily. (Patient not taking: Reported on 05/23/2021) 90 tablet 3   losartan (COZAAR) 25 MG tablet Take 1 tablet by mouth daily. (Patient not taking: Reported on 05/23/2021)     sertraline (ZOLOFT) 50 MG tablet Take 1 tablet (50 mg total) by mouth daily. 30 tablet 3   No facility-administered medications prior to visit.    Allergies  Allergen Reactions   Latex Hives   Sulfa Antibiotics Hives    ROS Review of Systems  Constitutional:  Negative for fever and unexpected weight change.  HENT:  Positive for dental problem and mouth sores. Negative for hearing loss and sore throat.   Eyes:  Negative for visual disturbance.  Respiratory:  Negative for cough and shortness of breath.   Cardiovascular:  Negative for chest pain.   Gastrointestinal:  Negative for diarrhea, nausea and vomiting.  Genitourinary:  Positive for frequency.  Musculoskeletal:  Positive for myalgias. Negative for back pain.  Neurological:  Negative for headaches.  Psychiatric/Behavioral:  Positive for dysphoric mood. Negative for suicidal ideas. The patient is nervous/anxious.      Objective:    Physical Exam Constitutional:      Appearance: Normal appearance. She is normal weight.  HENT:     Head: Normocephalic and atraumatic.     Nose: Nose normal.     Mouth/Throat:     Mouth: Mucous membranes are moist.     Dentition: Abnormal dentition (patient is missing most of her teeth). Dental caries and gum lesions present.     Tongue: No lesions.     Pharynx: Oropharynx is clear.  Neck:     Vascular: No carotid bruit.  Cardiovascular:     Rate and Rhythm: Normal rate and regular rhythm.     Pulses: Normal pulses.          Dorsalis pedis pulses are 2+ on the right side and 2+ on the left side.       Posterior tibial pulses are 2+ on the right side and 2+ on the left side.     Heart sounds: Normal heart sounds. No murmur heard.   No friction rub. No gallop.  Pulmonary:     Effort: Pulmonary effort is normal.     Breath sounds: Normal air entry. Examination of the right-middle field reveals rhonchi. Examination of the left-middle field reveals rhonchi. Examination of the right-lower field reveals rhonchi. Examination of the left-lower field reveals rhonchi. Rhonchi present. No wheezing or rales.  Musculoskeletal:     Right hand: No swelling or deformity. Decreased strength.     Left hand: No swelling or deformity. Decreased strength.     Right lower leg: No edema.     Left lower leg: No edema.     Right foot: Normal pulse.     Left foot:  Normal pulse.  Skin:    General: Skin is warm and dry.  Neurological:     General: No focal deficit present.     Mental Status: She is alert and oriented to person, place, and time.     Sensory:  Sensory deficit present.     Motor: Weakness present.  Psychiatric:        Mood and Affect: Mood normal.        Behavior: Behavior normal.        Thought Content: Thought content normal.        Judgment: Judgment normal.    BP (!) 147/88    Pulse 65    Resp 16    Wt 130 lb 3.2 oz (59.1 kg)    SpO2 95%    BMI 21.01 kg/m  Wt Readings from Last 3 Encounters:  05/23/21 130 lb 3.2 oz (59.1 kg)  04/04/21 125 lb 6.4 oz (56.9 kg)  12/04/15 125 lb (56.7 kg)     Health Maintenance Due  Topic Date Due   PAP SMEAR-Modifier  Never done   COLON CANCER SCREENING ANNUAL FOBT  Never done    There are no preventive care reminders to display for this patient.  Lab Results  Component Value Date   TSH 2.040 04/04/2021   Lab Results  Component Value Date   WBC 9.8 12/04/2015   HGB 13.9 12/04/2015   HCT 42.2 12/04/2015   MCV 99.1 12/04/2015   PLT 312 12/04/2015   Lab Results  Component Value Date   NA 139 12/04/2015   K 3.6 12/04/2015   CO2 26 12/04/2015   GLUCOSE 88 12/04/2015   BUN 7 12/04/2015   CREATININE 0.82 12/04/2015   BILITOT 0.5 12/04/2015   ALKPHOS 47 12/04/2015   AST 19 12/04/2015   ALT 12 (L) 12/04/2015   PROT 7.1 12/04/2015   ALBUMIN 4.0 12/04/2015   CALCIUM 9.4 12/04/2015   ANIONGAP 7 12/04/2015   No results found for: CHOL No results found for: HDL No results found for: LDLCALC No results found for: TRIG No results found for: CHOLHDL No results found for: HGBA1C    Assessment & Plan:   Problem List Items Addressed This Visit       Cardiovascular and Mediastinum   Hypertension - Primary    BP not a goal, still  smoking Discontinue lisinopril-HCTZ due to being unable to take due to side effects. Start valsartan 160 mg and continue with amlodipine as prescribed.      Relevant Medications   amLODipine (NORVASC) 10 MG tablet   atorvastatin (LIPITOR) 40 MG tablet   valsartan (DIOVAN) 160 MG tablet   Other Relevant Orders   CMP14+EGFR   CBC with  Differential/Platelet     Respiratory   COPD with chronic bronchitis ([place])    Continue with smoking reduction/cessation efforts as discussed. Inhaler refills sent to pharmacy; continue using inhalers as directed.       Relevant Medications   albuterol (VENTOLIN HFA) 108 (90 Base) MCG/ACT inhaler   fluticasone (FLOVENT HFA) 44 MCG/ACT inhaler   Moderate persistent asthma without complication    As per copd assessment      Relevant Medications   albuterol (VENTOLIN HFA) 108 (90 Base) MCG/ACT inhaler   fluticasone (FLOVENT HFA) 44 MCG/ACT inhaler     Digestive   Oral candida    No oral yeast but periodontal disease severe Rx peridex mouthwash, pt to make [place] aware when the patient has insurance and will send  to dentist, dental resources given to the patient      Periodontal disease    peridex mouth wash  Ref to dental after feb 1 with new insurance        Other   Paresthesia    Positive Phalen and Tinel on today's exam of the right hand raising suspicion for carpal tunnel of the right hand. Diminished sensation in hands and feet as well. Keep appointment with orthopedics scheduled for February for further workup/evaluation.   Refill gabapentin      Relevant Medications   gabapentin (NEURONTIN) 300 MG capsule   Substance abuse ([place])    Currently sober and in sober living situation and has counseling and mental health in place       Hyperlipidemia    Continue with atorvastatin. Re assess lipid panel, healthy diet discussed      Relevant Medications   amLODipine (NORVASC) 10 MG tablet   atorvastatin (LIPITOR) 40 MG tablet   valsartan (DIOVAN) 160 MG tablet   Other Relevant Orders   Lipid Panel   Positive depression screening    On therapy with mental health  rx for sertraline higher dose 15m issued      Anemia    Re assess CBC      Adjustment disorder with mixed anxiety and depressed mood    As per depression screen note      Depression with anxiety     Continue medications as prescribed by behavioral health and continue follow up with behavioral health as planned.       Relevant Medications   sertraline (ZOLOFT) 50 MG tablet   Alcohol use disorder, severe, dependence ([place]    Patient has been sober for 90 days and is committed to recovery. Continue with support groups and follow up with behavioral health as planned. CBC and CMP ordered today to assess for anemias and liver function.      Breast mass in female    Needs f/u mammogram.  Will order once gets insurance feb 1      Severe cannabis use disorder ([place]    Not currently on /THC      Tobacco use    Continue working on smoking reduction and cessation as discussed.    Current smoking consumption amount: 1/2 PPD  Dicsussion on advise to quit smoking and smoking impacts: CV lung impacts  Patient's willingness to quit:  In sober living for subst use/ etoh will be a challenge  Methods to quit smoking discussed:  behav mod  Medication management of smoking session drugs discussed:none  Resources provided:  AVS   Setting quit date not established  Follow-up arranged 3 months   Time spent counseling the patient:  5 min       Need for hepatitis C screening test    Hepatitis C screening ordered. Patient will get this done at the lab today.      Relevant Orders   HCV Ab w Reflex to Quant PCR   Encounter for screening for HIV    HIV screening ordered. Patient will have this done at lab today.       Relevant Orders   HIV Antibody (routine testing w rflx)   Colon cancer screening    FOBT ordered.      Relevant Orders   Fecal occult blood, imunochemical([facility])   Other Visit Diagnoses     Bronchospasm       Relevant Medications   albuterol (VENTOLIN HFA) 108 (90 Base) MCG/ACT inhaler  fluticasone (FLOVENT HFA) 44 MCG/ACT inhaler       Meds ordered this encounter  Medications   amLODipine (NORVASC) 10 MG tablet    Sig: Take 1 tablet (10 mg  total) by mouth daily.    Dispense:  90 tablet    Refill:  1   albuterol (VENTOLIN HFA) 108 (90 Base) MCG/ACT inhaler    Sig: Inhale 2 puffs into the lungs 3 (three) times daily as needed.    Dispense:  18 g    Refill:  2   atorvastatin (LIPITOR) 40 MG tablet    Sig: Take 1 tablet (40 mg total) by mouth daily.    Dispense:  90 tablet    Refill:  1   fluticasone (FLOVENT HFA) 44 MCG/ACT inhaler    Sig: Inhale 2 puffs into the lungs in the morning and at bedtime.    Dispense:  10.6 g    Refill:  3   gabapentin (NEURONTIN) 300 MG capsule    Sig: Take 1 capsule (300 mg total) by mouth at bedtime.    Dispense:  60 capsule    Refill:  3   sertraline (ZOLOFT) 50 MG tablet    Sig: Take 1 tablet (50 mg total) by mouth daily.    Dispense:  30 tablet    Refill:  3   valsartan (DIOVAN) 160 MG tablet    Sig: Take 1 tablet (160 mg total) by mouth daily.    Dispense:  90 tablet    Refill:  3   chlorhexidine (PERIDEX) 0.12 % solution    Sig: Use as directed 15 mLs in the mouth or throat 2 (two) times daily.    Dispense:  120 mL    Refill:  0  Declined all vaccines at this visit  will get mammogram when gets insurance.  Dental when gets insurance 38 min spent reviewing old records , performing hx and physical, educating patient high complex decision making , multiple active problems Follow-up: Return in about 3 months (around 08/21/2021).    Asencion Noble, MD

## 2021-05-24 ENCOUNTER — Telehealth: Payer: Self-pay

## 2021-05-24 ENCOUNTER — Other Ambulatory Visit: Payer: Self-pay

## 2021-05-24 ENCOUNTER — Ambulatory Visit: Payer: Self-pay | Admitting: *Deleted

## 2021-05-24 ENCOUNTER — Other Ambulatory Visit: Payer: Self-pay | Admitting: Pharmacist

## 2021-05-24 ENCOUNTER — Ambulatory Visit: Payer: Self-pay | Admitting: Orthopedic Surgery

## 2021-05-24 DIAGNOSIS — K056 Periodontal disease, unspecified: Secondary | ICD-10-CM | POA: Insufficient documentation

## 2021-05-24 LAB — CBC WITH DIFFERENTIAL/PLATELET
Basophils Absolute: 0.1 10*3/uL (ref 0.0–0.2)
Basos: 1 %
EOS (ABSOLUTE): 0.2 10*3/uL (ref 0.0–0.4)
Eos: 3 %
Hematocrit: 39.9 % (ref 34.0–46.6)
Hemoglobin: 13.9 g/dL (ref 11.1–15.9)
Immature Grans (Abs): 0 10*3/uL (ref 0.0–0.1)
Immature Granulocytes: 0 %
Lymphocytes Absolute: 2.9 10*3/uL (ref 0.7–3.1)
Lymphs: 34 %
MCH: 32.1 pg (ref 26.6–33.0)
MCHC: 34.8 g/dL (ref 31.5–35.7)
MCV: 92 fL (ref 79–97)
Monocytes Absolute: 0.6 10*3/uL (ref 0.1–0.9)
Monocytes: 7 %
Neutrophils Absolute: 4.7 10*3/uL (ref 1.4–7.0)
Neutrophils: 55 %
Platelets: 324 10*3/uL (ref 150–450)
RBC: 4.33 x10E6/uL (ref 3.77–5.28)
RDW: 11.7 % (ref 11.7–15.4)
WBC: 8.5 10*3/uL (ref 3.4–10.8)

## 2021-05-24 LAB — HCV INTERPRETATION

## 2021-05-24 LAB — CMP14+EGFR
ALT: 13 IU/L (ref 0–32)
AST: 17 IU/L (ref 0–40)
Albumin/Globulin Ratio: 2 (ref 1.2–2.2)
Albumin: 4.7 g/dL (ref 3.8–4.8)
Alkaline Phosphatase: 54 IU/L (ref 44–121)
BUN/Creatinine Ratio: 16 (ref 9–23)
BUN: 13 mg/dL (ref 6–24)
Bilirubin Total: 0.3 mg/dL (ref 0.0–1.2)
CO2: 21 mmol/L (ref 20–29)
Calcium: 9.9 mg/dL (ref 8.7–10.2)
Chloride: 98 mmol/L (ref 96–106)
Creatinine, Ser: 0.81 mg/dL (ref 0.57–1.00)
Globulin, Total: 2.3 g/dL (ref 1.5–4.5)
Glucose: 82 mg/dL (ref 70–99)
Potassium: 4.3 mmol/L (ref 3.5–5.2)
Sodium: 136 mmol/L (ref 134–144)
Total Protein: 7 g/dL (ref 6.0–8.5)
eGFR: 91 mL/min/{1.73_m2} (ref >59)

## 2021-05-24 LAB — LIPID PANEL
Chol/HDL Ratio: 3.1 ratio (ref 0.0–4.4)
Cholesterol, Total: 153 mg/dL (ref 100–199)
HDL: 49 mg/dL (ref >39)
LDL Chol Calc (NIH): 92 mg/dL (ref 0–99)
Triglycerides: 56 mg/dL (ref 0–149)
VLDL Cholesterol Cal: 12 mg/dL (ref 5–40)

## 2021-05-24 LAB — HIV ANTIBODY (ROUTINE TESTING W REFLEX): HIV Screen 4th Generation wRfx: NONREACTIVE

## 2021-05-24 LAB — HCV AB W REFLEX TO QUANT PCR: HCV Ab: <0.1 s/co ratio (ref 0.0–0.9)

## 2021-05-24 MED ORDER — CHLORHEXIDINE GLUCONATE 0.12 % MT SOLN
15.0000 mL | Freq: Two times a day (BID) | OROMUCOSAL | 0 refills | Status: DC
  Filled 2021-05-24: qty 120, 4d supply, fill #0

## 2021-05-24 MED ORDER — CHLORHEXIDINE GLUCONATE 0.12 % MT SOLN
15.0000 mL | Freq: Two times a day (BID) | OROMUCOSAL | 0 refills | Status: DC
  Filled 2021-05-24: qty 473, 16d supply, fill #0

## 2021-05-24 NOTE — Telephone Encounter (Signed)
-----   Message from Elsie Stain, MD sent at 05/24/2021  1:15 PM EST ----- Let pt know: hiv hep C neg,  liver kidney normal , blood counts normal

## 2021-05-24 NOTE — Assessment & Plan Note (Signed)
Currently sober and in sober living situation and has counseling and mental health in place

## 2021-05-24 NOTE — Assessment & Plan Note (Signed)
Not currently on [facility]

## 2021-05-24 NOTE — Assessment & Plan Note (Signed)
peridex mouth wash  Ref to dental after feb 1 with new insurance

## 2021-05-24 NOTE — Assessment & Plan Note (Signed)
On therapy with mental health  rx for sertraline higher dose 50mg  issued

## 2021-05-24 NOTE — Assessment & Plan Note (Signed)
Needs f/u mammogram.  Will order once gets insurance feb 1

## 2021-05-24 NOTE — Assessment & Plan Note (Signed)
As per depression screen note

## 2021-05-24 NOTE — Telephone Encounter (Signed)
Pt was called and vm was left, Information has been sent to nurse pool.   

## 2021-05-24 NOTE — Telephone Encounter (Signed)
Pt given lab results per notes of Dr. Joya Gaskins on 05/24/21. Pt verbalized understanding.

## 2021-05-24 NOTE — Assessment & Plan Note (Signed)
As per copd assessment

## 2021-05-24 NOTE — Assessment & Plan Note (Signed)
No oral yeast but periodontal disease severe Rx peridex mouthwash, pt to make [place] aware when the patient has insurance and will send to dentist, dental resources given to the patient

## 2021-05-24 NOTE — Assessment & Plan Note (Signed)
Reassess CBC 

## 2021-05-30 ENCOUNTER — Other Ambulatory Visit: Payer: Self-pay | Admitting: Critical Care Medicine

## 2021-05-30 DIAGNOSIS — K635 Polyp of colon: Secondary | ICD-10-CM | POA: Insufficient documentation

## 2021-05-30 DIAGNOSIS — R195 Other fecal abnormalities: Secondary | ICD-10-CM

## 2021-05-30 LAB — SPECIMEN STATUS REPORT

## 2021-05-30 LAB — FECAL OCCULT BLOOD, IMMUNOCHEMICAL: Fecal Occult Bld: POSITIVE — AB

## 2021-06-01 NOTE — Telephone Encounter (Signed)
Final attempt to reach pt to schedule an appt. No answer, left vm.

## 2021-06-04 ENCOUNTER — Other Ambulatory Visit: Payer: Self-pay

## 2021-06-07 ENCOUNTER — Telehealth: Payer: Self-pay

## 2021-06-07 ENCOUNTER — Encounter: Payer: Self-pay | Admitting: Orthopedic Surgery

## 2021-06-07 ENCOUNTER — Other Ambulatory Visit: Payer: Self-pay

## 2021-06-07 ENCOUNTER — Ambulatory Visit: Payer: Self-pay | Admitting: Orthopedic Surgery

## 2021-06-07 NOTE — Telephone Encounter (Signed)
Contacted patient however had to leave a voicemail to reschedule appointment with Dr. Tempie Donning.

## 2021-06-09 ENCOUNTER — Emergency Department (HOSPITAL_BASED_OUTPATIENT_CLINIC_OR_DEPARTMENT_OTHER)
Admission: EM | Admit: 2021-06-09 | Discharge: 2021-06-09 | Disposition: A | Discharge status: Home / Self Care | Payer: Commercial Managed Care - HMO | Attending: Emergency Medicine | Admitting: Emergency Medicine

## 2021-06-09 ENCOUNTER — Other Ambulatory Visit: Payer: Self-pay

## 2021-06-09 ENCOUNTER — Encounter (HOSPITAL_BASED_OUTPATIENT_CLINIC_OR_DEPARTMENT_OTHER): Payer: Self-pay | Admitting: Emergency Medicine

## 2021-06-09 DIAGNOSIS — Z79899 Other long term (current) drug therapy: Secondary | ICD-10-CM | POA: Diagnosis not present

## 2021-06-09 DIAGNOSIS — R202 Paresthesia of skin: Secondary | ICD-10-CM | POA: Insufficient documentation

## 2021-06-09 DIAGNOSIS — Z91040 Latex allergy status: Secondary | ICD-10-CM | POA: Diagnosis not present

## 2021-06-09 NOTE — ED Triage Notes (Signed)
Pt reports right hand swelling and pain for a month that worsened. Pt referred to specialist but has not been able to get in.

## 2021-06-09 NOTE — Discharge Instructions (Addendum)
Try splinting her wrist at night.  Please follow-up with your family doctor in the office.  I placed a referral for the neurologist to reach out to you to do further testing for this.

## 2021-06-09 NOTE — ED Provider Notes (Signed)
[facility] DEPT Provider Note   CSN: [id number] Arrival date & time: 06/09/21  1119     History  No chief complaint on file.   Karen Pham is a 46 y.o. female.  46 yo F with a chief complaint of right hand numbness.  Describes this as a feeling like her hand is falling asleep.  Has had it gone for a few months.  Worse usually in the middle the night when she wakes up.  I tried to hold her hand in different positions but without improvement.  Denies injury.  Denies neck pain.  Denies headaches.       Home Medications Prior to Admission medications   Medication Sig Start Date End Date Taking? Authorizing Provider  albuterol (VENTOLIN HFA) 108 (90 Base) MCG/ACT inhaler Inhale 2 puffs into the lungs 3 (three) times daily as needed. 05/23/21   Elsie Stain, MD  amLODipine (NORVASC) 10 MG tablet Take 1 tablet (10 mg total) by mouth daily. 05/23/21   Elsie Stain, MD  aspirin 81 MG chewable tablet Chew 1 tablet (81 mg total) by mouth daily. Patient not taking: Reported on 05/23/2021 04/04/21   Argentina Donovan, PA-C  atorvastatin (LIPITOR) 40 MG tablet Take 1 tablet (40 mg total) by mouth daily. 05/23/21   Elsie Stain, MD  chlorhexidine (PERIDEX) 0.12 % solution Use as directed 15 mLs in the mouth or throat 2 (two) times daily. 05/24/21   Elsie Stain, MD  fluticasone (FLOVENT HFA) 44 MCG/ACT inhaler Inhale 2 puffs into the lungs in the morning and at bedtime. 05/23/21   Elsie Stain, MD  gabapentin (NEURONTIN) 300 MG capsule Take 1 capsule (300 mg total) by mouth at bedtime. 05/23/21   Elsie Stain, MD  hydrOXYzine (ATARAX) 25 MG tablet Take 1 tablet (25 mg total) by mouth 3 (three) times daily as needed. 05/15/21   Salley Slaughter, NP  QUEtiapine (SEROQUEL) 50 MG tablet Take 1 tablet (50 mg total) by mouth at bedtime. 05/15/21   Salley Slaughter, NP  sertraline (ZOLOFT) 50 MG tablet Take 1 tablet (50 mg total) by mouth daily. 05/23/21    Elsie Stain, MD  valsartan (DIOVAN) 160 MG tablet Take 1 tablet (160 mg total) by mouth daily. 05/23/21   Elsie Stain, MD      Allergies    Latex and Sulfa antibiotics    Review of Systems   Review of Systems  Physical Exam Updated Vital Signs BP 135/82 (BP Location: Right Arm)    Pulse 75    Temp 98.1 F (36.7 C) (Oral)    Resp 18    Ht 5\' 6"  (1.676 m)    Wt 59 kg    SpO2 100%    BMI 20.98 kg/m  Physical Exam Vitals and nursing note reviewed.  Constitutional:      General: She is not in acute distress.    Appearance: She is well-developed. She is not diaphoretic.  HENT:     Head: Normocephalic and atraumatic.  Eyes:     Pupils: Pupils are equal, round, and reactive to light.  Cardiovascular:     Rate and Rhythm: Normal rate and regular rhythm.     Heart sounds: No murmur heard.   No friction rub. No gallop.  Pulmonary:     Effort: Pulmonary effort is normal.     Breath sounds: No wheezing or rales.  Abdominal:     General: There is no distension.  Palpations: Abdomen is soft.     Tenderness: There is no abdominal tenderness.  Musculoskeletal:        General: No tenderness.     Cervical back: Normal range of motion and neck supple.     Comments: Pulse motor and sensation intact to the right upper extremity.  Tinel's test without reproduction of symptoms.  Full motion of the elbow without pain.  Negative Spurling's test.  5 out of 5 strength when compared to other side.  Skin:    General: Skin is warm and dry.  Neurological:     Mental Status: She is alert and oriented to person, place, and time.  Psychiatric:        Behavior: Behavior normal.    ED Results / Procedures / Treatments   Labs (all labs ordered are listed, but only abnormal results are displayed) Labs Reviewed - No data to display  EKG None  Radiology No results found.  Procedures Procedures    Medications Ordered in ED Medications - No data to display  ED Course/ Medical  Decision Making/ A&P                           Medical Decision Making  46 yo F with a chief complaints of paresthesias to the right upper extremity.  This is mostly in the hand.  She tells me its all over the hand.  I am unable to reproduce it.  She has no neurologic deficit here.  Pulse motor and sensation intact.  Most likely she is having paresthesias.  As the symptoms are worse at night we will place into a splint.  Given neurology follow-up.  I did review her records and it appears that she has seen her family doctor for this.  He was referred to orthopedics but she has not yet seen them in the office. 12:32 PM:  I have discussed the diagnosis/risks/treatment options with the patient.  Evaluation and diagnostic testing in the emergency department does not suggest an emergent condition requiring admission or immediate intervention beyond what has been performed at this time.  They will follow up with  PCP. We also discussed returning to the ED immediately if new or worsening sx occur. We discussed the sx which are most concerning (e.g., sudden worsening pain, fever, inability to tolerate by mouth) that necessitate immediate return. Medications administered to the patient during their visit and any new prescriptions provided to the patient are listed below.  Medications given during this visit Medications - No data to display   The patient appears reasonably screen and/or stabilized for discharge and I doubt any other medical condition or other [facility] requiring further screening, evaluation, or treatment in the ED at this time prior to discharge.          Final Clinical Impression(s) / ED Diagnoses Final diagnoses:  Paresthesia    Rx / DC Orders ED Discharge Orders          Ordered    Ambulatory referral to Neurology       Comments: Paresthesia, R upper extremity distal to the elbow   06/09/21 [place], DO 06/09/21 1232

## 2021-06-11 ENCOUNTER — Ambulatory Visit: Payer: Self-pay

## 2021-06-11 ENCOUNTER — Ambulatory Visit (INDEPENDENT_AMBULATORY_CARE_PROVIDER_SITE_OTHER): Payer: 59 | Admitting: Licensed Clinical Social Worker

## 2021-06-11 DIAGNOSIS — F4323 Adjustment disorder with mixed anxiety and depressed mood: Secondary | ICD-10-CM | POA: Diagnosis not present

## 2021-06-11 NOTE — Telephone Encounter (Signed)
2nd attempt, pt called. Left VM to call back to speak with a nurse

## 2021-06-11 NOTE — Progress Notes (Signed)
° °  THERAPIST PROGRESS NOTE  Virtual Visit via Video Note  I connected with [name] on 06/11/21 at 11:00 AM EST by a video enabled telemedicine application and verified that I am speaking with the correct person using two identifiers.  Location: Patient: [facility]  Provider: Provider Home    I discussed the limitations of evaluation and management by telemedicine and the availability of in person appointments. The patient expressed understanding and agreed to proceed.     I discussed the assessment and treatment plan with the patient. The patient was provided an opportunity to ask questions and all were answered. The patient agreed with the plan and demonstrated an understanding of the instructions.   The patient was advised to call back or seek an in-person evaluation if the symptoms worsen or if the condition fails to improve as anticipated.  I provided 40 minutes of non-face-to-face time during this encounter.   Dory Horn, LCSW   Participation Level: Active  Behavioral Response: CasualAlertAnxious  Type of Therapy: Individual Therapy  Treatment Goals addressed: Anxiety and Coping  Interventions: CBT and Supportive   Suicidal/Homicidal: Nowithout intent/plan  Therapist Response:   Pt was alert and oriented x 5. She was dressed casually and engaged well in virtual therapy session. She presented with depressed and anxious mood /affect. She was pleasant, cooperative, and maintained good eye contact.   Pt primary stressor today was illness. She reports that she has been have pain in her arm. She has gone to the ED, and they referred her on Saturday to the Neurologist. Pt did f/u with the neurologist today however they did not answer and pt left a vm. Brenna reports frustration, irritability, hopelessness, worthlessness, and worry about her arm. She reports that she is frustrated with the medical system as a whole and answers have been harder to come by.    Interventions/Plan: LCSW used supportive therapy for praise and encouragement. LCSW used empowerment and advocacy for person centered therapy. LCSW used open ended questions and reflective listening for motivational interviewing. Plan for pt is to f/u 1 to 2 x per day with neurologist referral until appointment is provided. Pt to continue to maintain sobriety by attending weekly AA meetings.     Plan: Return again in 4 weeks.    Dory Horn, LCSW 06/11/2021

## 2021-06-11 NOTE — Telephone Encounter (Signed)
°  Chief Complaint: arm swelling Symptoms:  R arm swelling, warm to touch, pinkish and got knot near elbow Frequency: ongoing for a while Pertinent Negatives: NA Disposition: [] ED /[] Urgent Care (no appt availability in office) / [] Appointment(In office/virtual)/ []  [facility]/ [] Home Care/ [] Refused Recommended Disposition /[x] [facility]/ []  Follow-up with PCP Additional Notes: pt was provided in [employer] of locations for Tues and [employer]    Reason for Disposition  MODERATE arm swelling (e.g., puffiness or swollen feeling of entire arm)  Answer Assessment - Initial Assessment Questions 1. ONSET: "When did the swelling start?" (e.g., minutes, hours, days)     ongoing 2. LOCATION: "What part of the arm is swollen?"  "Are both arms swollen or just one arm?"     R arm from hand to elbow 3. SEVERITY: "How bad is the swelling?" (e.g., localized; mild, moderate, severe)   - LOCALIZED: Small area of puffiness or swelling on just one arm   - JOINT SWELLING: Swelling of one joint   - MILD: Puffiness or swelling of hand   - MODERATE: Puffiness or swollen feeling of entire arm    - SEVERE: All of arm looks swollen; pitting edema     moderate 4. REDNESS: "Does the swelling look red or infected?"     pinkish 5. PAIN: "Is the swelling painful to touch?" If Yes, ask: "How painful is it?"   (Scale 1-10; mild, moderate or severe)     6/7 6. FEVER: "Do you have a fever?" If Yes, ask: "What is it, how was it measured, and when did it start?"      No 10. OTHER SYMPTOMS: "Do you have any other symptoms?" (e.g., chest pain, difficulty breathing)       Knot on elbow, warm to touch  Protocols used: Arm Swelling and Edema-A-AH

## 2021-06-11 NOTE — Telephone Encounter (Signed)
Patient called, left VM to return the call to the office to discuss symptoms with a nurse.   Summary: ARM swollen   Pts arm is swollen and warm to touch ./ pt called to get a Hosp f/u and a CRM was sent to office about a sooner appt/ pt also has an appt with Ortho for Wednesday / please advise

## 2021-06-18 ENCOUNTER — Other Ambulatory Visit: Payer: Self-pay

## 2021-06-18 ENCOUNTER — Ambulatory Visit (INDEPENDENT_AMBULATORY_CARE_PROVIDER_SITE_OTHER): Payer: Managed Care, Other (non HMO) | Admitting: Orthopedic Surgery

## 2021-06-18 ENCOUNTER — Ambulatory Visit (INDEPENDENT_AMBULATORY_CARE_PROVIDER_SITE_OTHER): Payer: Managed Care, Other (non HMO)

## 2021-06-18 ENCOUNTER — Encounter: Payer: Self-pay | Admitting: Orthopedic Surgery

## 2021-06-18 VITALS — BP 136/74 | HR 72 | Ht 66.0 in | Wt 134.8 lb

## 2021-06-18 DIAGNOSIS — M25521 Pain in right elbow: Secondary | ICD-10-CM | POA: Diagnosis not present

## 2021-06-18 DIAGNOSIS — M79641 Pain in right hand: Secondary | ICD-10-CM

## 2021-06-19 ENCOUNTER — Encounter: Payer: Self-pay | Admitting: Diagnostic Neuroimaging

## 2021-06-19 ENCOUNTER — Ambulatory Visit: Payer: Managed Care, Other (non HMO) | Admitting: Diagnostic Neuroimaging

## 2021-06-19 VITALS — BP 109/70 | HR 72 | Ht 66.0 in | Wt 132.0 lb

## 2021-06-19 DIAGNOSIS — M79641 Pain in right hand: Secondary | ICD-10-CM

## 2021-06-19 NOTE — Progress Notes (Signed)
[facility]  PATIENT: Karen Pham DOB: 1975-09-08  REFERRING CLINICIAN: Deno Etienne, DO HISTORY FROM: PATIENT  REASON FOR VISIT: NEW CONSULT   HISTORICAL  CHIEF COMPLAINT:  Chief Complaint  Patient presents with   New Patient (Initial Visit)    Rm 7 alone here for consult on numbness/tingling located in the right arm. Pt reports symptoms have been present over the last 1.5 months.     HISTORY OF PRESENT ILLNESS:   46 year old female here for evaluation right arm pain and right hand numbness.  Symptoms started around January 2023.  Patient has numbness that wakes her up from sleep.  She has tenderness in her extensor tendons and lateral epicondyle on the right side.  She has a wrist splint that she has been using without relief.  Patient went to the emergency room on 06/09/2021 for evaluation and was referred here to neurology clinic for evaluation.  06/13/2021 patient saw Dr. Fredna Dow for evaluation and was diagnosed with possible carpal tunnel syndrome and recommended to have EMG nerve conduction studies.  However she was not able to follow-up with him due to insurance reasons.  06/18/2021 patient saw Dr. Tempie Donning for similar evaluation was diagnosed with possible lateral epicondylitis and possible carpal tunnel syndrome.    REVIEW OF SYSTEMS: Full 14 system review of systems performed and negative with exception of: as per HPI.  ALLERGIES: Allergies  Allergen Reactions   Latex Hives   Sulfa Antibiotics Hives    HOME MEDICATIONS: Outpatient Medications Prior to Visit  Medication Sig Dispense Refill   albuterol (VENTOLIN HFA) 108 (90 Base) MCG/ACT inhaler Inhale 2 puffs into the lungs 3 (three) times daily as needed. 18 g 2   amLODipine (NORVASC) 10 MG tablet Take 1 tablet (10 mg total) by mouth daily. 90 tablet 1   aspirin 81 MG chewable tablet Chew 1 tablet (81 mg total) by mouth daily. 90 tablet 1   atorvastatin (LIPITOR) 40 MG tablet Take 1 tablet (40  mg total) by mouth daily. 90 tablet 1   chlorhexidine (PERIDEX) 0.12 % solution Use as directed 15 mLs in the mouth or throat 2 (two) times daily. 473 mL 0   fluticasone (FLOVENT HFA) 44 MCG/ACT inhaler Inhale 2 puffs into the lungs in the morning and at bedtime. 10.6 g 3   gabapentin (NEURONTIN) 300 MG capsule Take 1 capsule (300 mg total) by mouth at bedtime. 60 capsule 3   hydrOXYzine (ATARAX) 25 MG tablet Take 1 tablet (25 mg total) by mouth 3 (three) times daily as needed. 30 tablet 3   QUEtiapine (SEROQUEL) 50 MG tablet Take 1 tablet (50 mg total) by mouth at bedtime. 30 tablet 3   sertraline (ZOLOFT) 50 MG tablet Take 1 tablet (50 mg total) by mouth daily. 30 tablet 3   valsartan (DIOVAN) 160 MG tablet Take 1 tablet (160 mg total) by mouth daily. 90 tablet 3   No facility-administered medications prior to visit.    PAST MEDICAL HISTORY: Past Medical History:  Diagnosis Date   COPD (chronic obstructive pulmonary disease) ([place])    Liver cirrhosis ([place])     PAST SURGICAL HISTORY: History reviewed. No pertinent surgical history.  FAMILY HISTORY: History reviewed. No pertinent family history.  SOCIAL HISTORY: Social History   Socioeconomic History   Marital status: Single    Spouse name: Not on file   Number of children: Not on file   Years of education: Not on file   Highest education level: Not on file  Occupational History    Employer: [employer]  Tobacco Use   Smoking status: Every Day    Packs/day: 0.50    Types: Cigarettes   Smokeless tobacco: Never  Substance and Sexual Activity   Alcohol use: Yes    Alcohol/week: 86.0 standard drinks    Types: 86 Cans of beer per week   Drug use: Not Currently    Frequency: 1.0 times per week    Types: "Crack" cocaine, Cocaine, Other-see comments, Marijuana    Comment: 90 days sober [facility]   Sexual activity: Not Currently  Other Topics Concern   Not on file  Social History Narrative   Right handed    Some caffeine    Social Determinants of Health   Financial Resource Strain: Medium Risk   Difficulty of Paying Living Expenses: Somewhat hard  Food Insecurity: Food Insecurity Present   Worried About Charity fundraiser in the Last Year: Sometimes true   Ran Out of Food in the Last Year: Sometimes true  Transportation Needs: No Transportation Needs   Lack of Transportation (Medical): No   Lack of Transportation (Non-Medical): No  Physical Activity: Sufficiently Active   Days of Exercise per Week: 5 days   Minutes of Exercise per Session: 60 min  Stress: Stress Concern Present   Feeling of Stress : Very much  Social Connections: Socially Isolated   Frequency of Communication with Friends and Family: Three times a week   Frequency of Social Gatherings with Friends and Family: Once a week   Attends Religious Services: Never   Marine scientist or Organizations: No   Attends Music therapist: Never   Marital Status: Never married  Human resources officer Violence: Not At Risk   Fear of Current or Ex-Partner: No   Emotionally Abused: No   Physically Abused: No   Sexually Abused: No     PHYSICAL EXAM  GENERAL EXAM/CONSTITUTIONAL: Vitals:  Vitals:   06/19/21 1401  BP: 109/70  Pulse: 72  Weight: 132 lb (59.9 kg)  Height: 5\' 6"  (1.676 m)   Body mass index is 21.31 kg/m. Wt Readings from Last 3 Encounters:  06/19/21 132 lb (59.9 kg)  06/18/21 134 lb 12.8 oz (61.1 kg)  06/09/21 130 lb (59 kg)   Patient is in no distress; well developed, nourished and groomed; neck is supple  CARDIOVASCULAR: Examination of carotid arteries is normal; no carotid bruits Regular rate and rhythm, no murmurs Examination of peripheral vascular system by observation and palpation is normal  EYES: Ophthalmoscopic exam of optic discs and posterior segments is normal; no papilledema or hemorrhages No results found.  MUSCULOSKELETAL: Gait, strength, tone, movements noted in Neurologic exam  below  NEUROLOGIC: MENTAL STATUS:  No flowsheet data found. awake, alert, oriented to person, place and time recent and remote memory intact normal attention and concentration language fluent, comprehension intact, naming intact fund of knowledge appropriate  CRANIAL NERVE:  2nd - no papilledema on fundoscopic exam 2nd, 3rd, 4th, 6th - pupils equal and reactive to light, visual fields full to confrontation, extraocular muscles intact, no nystagmus 5th - facial sensation symmetric 7th - facial strength symmetric 8th - hearing intact 9th - palate elevates symmetrically, uvula midline 11th - shoulder shrug symmetric 12th - tongue protrusion midline  MOTOR:  normal bulk and tone, full strength in the BUE, BLE  SENSORY:  normal and symmetric to light touch, pinprick, temperature, vibration; EXCEPT BORERLINE PHALENS ON RIGHT AND DECR PP IN RIGHT HAND (ALL DIGITS)  COORDINATION:  finger-nose-finger, fine finger movements normal  REFLEXES:  deep tendon reflexes TRACE and symmetric  GAIT/STATION:  narrow based gait     DIAGNOSTIC DATA (LABS, IMAGING, TESTING) - I reviewed patient records, labs, notes, testing and imaging myself where available.  Lab Results  Component Value Date   WBC 8.5 05/23/2021   HGB 13.9 05/23/2021   HCT 39.9 05/23/2021   MCV 92 05/23/2021   PLT 324 05/23/2021      Component Value Date/Time   NA 136 05/23/2021 1220   K 4.3 05/23/2021 1220   CL 98 05/23/2021 1220   CO2 21 05/23/2021 1220   GLUCOSE 82 05/23/2021 1220   GLUCOSE 88 12/04/2015 2007   BUN 13 05/23/2021 1220   CREATININE 0.81 05/23/2021 1220   CALCIUM 9.9 05/23/2021 1220   PROT 7.0 05/23/2021 1220   ALBUMIN 4.7 05/23/2021 1220   AST 17 05/23/2021 1220   ALT 13 05/23/2021 1220   ALKPHOS 54 05/23/2021 1220   BILITOT 0.3 05/23/2021 1220   GFRNONAA >60 12/04/2015 2007   GFRAA >60 12/04/2015 2007   Lab Results  Component Value Date   CHOL 153 05/23/2021   HDL 49 05/23/2021    LDLCALC 92 05/23/2021   TRIG 56 05/23/2021   CHOLHDL 3.1 05/23/2021   No results found for: HGBA1C Lab Results  Component Value Date   [id number] 676 04/01/2010   Lab Results  Component Value Date   TSH 2.040 04/04/2021       ASSESSMENT AND PLAN  46 y.o. year old female here with:   Dx:  1. Right hand pain     PLAN:  RIGHT HAND PAIN / numbness (since Jan 2023) - check EMG/NCS (right carpal tunnel syndrome eval) --> in reviewing chart I see that this order has just been placed by Dr. Tempie Donning to Dr. Ernestina Patches; therefore I will cancel my order; patient may follow up with ortho clinic - use wrist splint at night  Return for return to PCP.    Penni Bombard, MD 1/95/0932, 6:71 PM Certified in Neurology, Neurophysiology and Neuroimaging  [facility] [address], [place], [place] [address] [phone]

## 2021-06-19 NOTE — Progress Notes (Signed)
Office Visit Note   Patient: Karen Pham           Date of Birth: 1975/05/08           MRN: [id number] Visit Date: 06/18/2021              Requested by: Argentina Donovan, PA-C [place],  [place] [address] PCP: Elsie Stain, MD   Assessment & Plan: Visit Diagnoses:  1. Pain in right hand   2. Pain in right elbow     Plan: Discussed with patient that her story and exam findings are suggestive of a couple problems.  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  We discussed the diagnosis, prognosis, treatment options, and expected recovery for these conditions.  I like to get an EMG/nerve conduction study of the right upper extremity to evaluate her potential compressive neuropathies.  We also discussed the role of formal therapy in treating lateral epicondylitis.  I will see her back in the office once the nerve studies are completed.  Follow-Up Instructions: No follow-ups on file.   Orders:  Orders Placed This Encounter  Procedures   XR Hand Complete Right   XR Elbow Complete Right (3+View)   No orders of the defined types were placed in this encounter.     Procedures: No procedures performed   Clinical Data: No additional findings.   Subjective: Chief Complaint  Patient presents with   Right Hand - New Patient (Initial Visit)    This is a 46 year old right-hand-dominant female who presents with multiple complaints involving the right upper extremity.  She describes numbness and tingling in all of her fingers for the last month or so.  She says she wakes up with her fingers tingling.  This also wakes her up from sleep every single night.  She also describes pain at the lateral aspect of the right elbow that radiates distally into the proximal forearm.  She is never had any EMG/nerve conduction study.  She is never had any treatment for these conditions.  She  has no history of diabetes, thyroid disease, cervical spine issue, wrist trauma.   Review of Systems   Objective: Vital Signs: BP 136/74 (BP Location: Left Arm, Patient Position: Sitting, Cuff Size: Large)    Pulse 72    Ht 5\' 6"  (1.676 m)    Wt 134 lb 12.8 oz (61.1 kg)    SpO2 97%    BMI 21.76 kg/m   Physical Exam Constitutional:      Appearance: Normal appearance.  Cardiovascular:     Rate and Rhythm: Normal rate.     Pulses: Normal pulses.  Pulmonary:     Effort: Pulmonary effort is normal.  Skin:    General: Skin is warm and dry.     Capillary Refill: Capillary refill takes less than 2 seconds.  Neurological:     Mental Status: She is alert.    Right Hand Exam   Tenderness  The patient is experiencing no tenderness.   Range of Motion  The patient has normal right wrist ROM.   Other  Erythema: absent Sensation: normal Pulse: present  Comments:  + Tinel and Phalen at wrist.  + Tinel at elbow.  No ulnar nerve instability.  4+/5 thenar motor strength without atrophy.  5/5 first dorsal interosseous strength. TTP at lateral epicondyle with reproducible pain w/ resisted middle finger extension  with elbow extended.       Specialty Comments:  No specialty comments available.  Imaging: No results found.   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 No pertinent family history.  History reviewed. No pertinent surgical history. Social History   Occupational History   Not on file  Tobacco Use   Smoking status: Every Day    Packs/day: 0.50    Types: Cigarettes   Smokeless tobacco: Never  Substance and Sexual Activity   Alcohol use: Yes    Alcohol/week: 86.0 standard drinks    Types: 86 Cans of beer per week   Drug use: Not Currently    Frequency: 1.0 times per week    Types: "Crack" cocaine, Cocaine, Other-see comments, Marijuana    Comment: 90 days sober [facility]   Sexual activity: Not Currently

## 2021-06-19 NOTE — Patient Instructions (Signed)
°  RIGHT HAND PAIN / numbness (since Jan 2023) - check EMG/NCS (right carpal tunnel syndrome eval) - use wrist splint at night

## 2021-06-26 ENCOUNTER — Other Ambulatory Visit: Payer: Self-pay

## 2021-07-02 ENCOUNTER — Ambulatory Visit (HOSPITAL_COMMUNITY): Payer: 59 | Admitting: Licensed Clinical Social Worker

## 2021-07-02 ENCOUNTER — Other Ambulatory Visit: Payer: Self-pay

## 2021-07-02 ENCOUNTER — Encounter (HOSPITAL_COMMUNITY): Payer: Self-pay

## 2021-07-03 ENCOUNTER — Other Ambulatory Visit: Payer: Self-pay

## 2021-07-04 ENCOUNTER — Encounter: Payer: Self-pay | Admitting: Physical Medicine and Rehabilitation

## 2021-07-04 ENCOUNTER — Other Ambulatory Visit: Payer: Self-pay

## 2021-07-04 ENCOUNTER — Ambulatory Visit (INDEPENDENT_AMBULATORY_CARE_PROVIDER_SITE_OTHER): Payer: Managed Care, Other (non HMO) | Admitting: Physical Medicine and Rehabilitation

## 2021-07-04 DIAGNOSIS — R202 Paresthesia of skin: Secondary | ICD-10-CM | POA: Diagnosis not present

## 2021-07-04 NOTE — Progress Notes (Signed)
Pt state pain, numbness and tingling in her right hand and finger that travels up to her elbow. Pt state the pain wakes her up at night and her fingers can go cold and hot. Pt state she takes pain meds to help ease her pain. Pt state she right handed. ? ?Numeric Pain Rating Scale and Functional Assessment ?Average Pain 5 ? ? ?In the last MONTH (on 0-10 scale) has pain interfered with the following? ? ?1. General activity like being  able to carry out your everyday physical activities such as walking, climbing stairs, carrying groceries, or moving a chair?  ?Rating(10) ? ? ? ?

## 2021-07-05 ENCOUNTER — Other Ambulatory Visit: Payer: Self-pay

## 2021-07-09 ENCOUNTER — Other Ambulatory Visit: Payer: Self-pay

## 2021-07-09 ENCOUNTER — Ambulatory Visit (INDEPENDENT_AMBULATORY_CARE_PROVIDER_SITE_OTHER): Payer: Managed Care, Other (non HMO) | Admitting: Orthopedic Surgery

## 2021-07-09 ENCOUNTER — Encounter: Payer: Self-pay | Admitting: Orthopedic Surgery

## 2021-07-09 ENCOUNTER — Ambulatory Visit: Payer: Self-pay

## 2021-07-09 DIAGNOSIS — R202 Paresthesia of skin: Secondary | ICD-10-CM

## 2021-07-09 DIAGNOSIS — G5601 Carpal tunnel syndrome, right upper limb: Secondary | ICD-10-CM | POA: Diagnosis not present

## 2021-07-09 MED ORDER — GABAPENTIN 300 MG PO CAPS
300.0000 mg | ORAL_CAPSULE | Freq: Every day | ORAL | 0 refills | Status: DC
  Filled 2021-07-09: qty 90, 90d supply, fill #0
  Filled 2021-07-13: qty 30, 30d supply, fill #0

## 2021-07-09 NOTE — Procedures (Signed)
EMG & NCV Findings: ?Evaluation of the right median motor nerve showed prolonged distal onset latency (4.3 ms) and decreased conduction velocity (Elbow-Wrist, 44 m/s).  The right median (across palm) sensory nerve showed prolonged distal peak latency (Wrist, 5.1 ms) and prolonged distal peak latency (Palm, 3.0 ms).  All remaining nerves (as indicated in the following tables) were within normal limits.   ? ?All examined muscles (as indicated in the following table) showed no evidence of electrical instability.   ? ?Impression: ?The above electrodiagnostic study is ABNORMAL and reveals evidence of a moderate right median nerve entrapment at the wrist (carpal tunnel syndrome) affecting sensory and motor components. There is no significant electrodiagnostic evidence of any other focal nerve entrapment, brachial plexopathy or cervical radiculopathy.  ? ?Recommendations: ?1.  Follow-up with referring physician. ?2.  Continue current management of symptoms. ?3.  Continue use of resting splint at night-time and as needed during the day. ?4.  Suggest surgical evaluation. ? ?___________________________ ?Laurence Spates FAAPMR ?Board Certified, Tax adviser of Physical Medicine and Rehabilitation ? ? ? ?Nerve Conduction Studies ?Anti Sensory Summary Table ? ? Stim Site NR Peak (ms) Norm Peak (ms) P-T Amp (?V) Norm P-T Amp Site1 Site2 Delta-P (ms) Dist (cm) Vel (m/s) Norm Vel (m/s)  ?Right Median Acr Palm Anti Sensory (2nd Digit)  30.4?C  ?Wrist    *5.1 <3.6 19.9 >10 Wrist Palm 2.1 0.0    ?Palm    *3.0 <2.0 9.5         ?Right Radial Anti Sensory (Base 1st Digit)  30.9?C  ?Wrist    2.3 <3.1 49.0  Wrist Base 1st Digit 2.3 0.0    ?Right Ulnar Anti Sensory (5th Digit)  31?C  ?Wrist    3.4 <3.7 21.7 >15.0 Wrist 5th Digit 3.4 14.0 41 >38  ? ?Motor Summary Table ? ? Stim Site NR Onset (ms) Norm Onset (ms) O-P Amp (mV) Norm O-P Amp Site1 Site2 Delta-0 (ms) Dist (cm) Vel (m/s) Norm Vel (m/s)  ?Right Median Motor (Abd Poll Brev)  30.8?C   ?Wrist    *4.3 <4.2 5.9 >5 Elbow Wrist 4.1 18.0 *44 >50  ?Elbow    8.4  5.5         ?Right Ulnar Motor (Abd Dig Min)  30.8?C  ?Wrist    3.4 <4.2 8.3 >3 B Elbow Wrist 2.9 18.0 62 >53  ?B Elbow    6.3  8.4  A Elbow B Elbow 1.3 10.0 77 >53  ?A Elbow    7.6  8.3         ? ?EMG ? ? Side Muscle Nerve Root Ins Act Fibs Psw Amp Dur Poly Recrt Int Fraser Din Comment  ?Right Abd Poll Brev Median C8-T1 Nml Nml Nml Nml Nml 0 Nml Nml   ?Right 1stDorInt Ulnar C8-T1 Nml Nml Nml Nml Nml 0 Nml Nml   ?Right PronatorTeres Median C6-7 Nml Nml Nml Nml Nml 0 Nml Nml   ?Right Biceps Musculocut C5-6 Nml Nml Nml Nml Nml 0 Nml Nml   ?Right Deltoid Axillary C5-6 Nml Nml Nml Nml Nml 0 Nml Nml   ? ? ?Nerve Conduction Studies ?Anti Sensory Left/Right Comparison ? ? Stim Site L Lat (ms) R Lat (ms) L-R Lat (ms) L Amp (?V) R Amp (?V) L-R Amp (%) Site1 Site2 L Vel (m/s) R Vel (m/s) L-R Vel (m/s)  ?Median Acr Palm Anti Sensory (2nd Digit)  30.4?C  ?Wrist  *5.1   19.9  Wrist Palm     ?Palm  *  3.0   9.5        ?Radial Anti Sensory (Base 1st Digit)  30.9?C  ?Wrist  2.3   49.0  Wrist Base 1st Digit     ?Ulnar Anti Sensory (5th Digit)  31?C  ?Wrist  3.4   21.7  Wrist 5th Digit  41   ? ?Motor Left/Right Comparison ? ? Stim Site L Lat (ms) R Lat (ms) L-R Lat (ms) L Amp (mV) R Amp (mV) L-R Amp (%) Site1 Site2 L Vel (m/s) R Vel (m/s) L-R Vel (m/s)  ?Median Motor (Abd Poll Brev)  30.8?C  ?Wrist  *4.3   5.9  Elbow Wrist  *44   ?Elbow  8.4   5.5        ?Ulnar Motor (Abd Dig Min)  30.8?C  ?Wrist  3.4   8.3  B Elbow Wrist  62   ?B Elbow  6.3   8.4  A Elbow B Elbow  77   ?A Elbow  7.6   8.3        ? ? ? ?Waveforms: ?    ? ?   ? ? ?

## 2021-07-09 NOTE — H&P (View-Only) (Signed)
? ?Office Visit Note ?  ?Patient: Karen Pham           ?Date of Birth: 05-Jan-1976           ?MRN: [id number] ?Visit Date: 07/09/2021 ?             ?Requested by: Elsie Stain, MD ?[phone] ?[place],  [place] [address] ?PCP: Elsie Stain, MD ? ? ?Assessment & Plan: ?Visit Diagnoses:  ?1. Carpal tunnel syndrome, right upper limb   ? ? ?Plan: We discussed the diagnosis, prognosis, and both conservative and operative treatment options for carpal tunnel syndrome.  She has failed night splinting.  She is not interested in corticosteroid injection. ? ?After our discussion, the patient has elected to proceed with carpal tunnel release.  We reviewed the benefits of surgery and the potential risks including, but not limited to, persistent symptoms, infection, damage to nearby nerves and blood vessels, delayed wound healing, need for additional procedures.   ? ?All patient concerns and questions were addressed. ? ?A surgical date will be confirmed with the patient.   ? ?Follow-Up Instructions: No follow-ups on file.  ? ?Orders:  ?No orders of the defined types were placed in this encounter. ? ?No orders of the defined types were placed in this encounter. ? ? ? ? Procedures: ?No procedures performed ? ? ?Clinical Data: ?No additional findings. ? ? ?Subjective: ?Chief Complaint  ?Patient presents with  ? Right Hand - Follow-up  ? ? ?This is a 46 year old right-hand-dominant female who presents for follow up of numbness and tingling of her right hand.  She describes numbness in all of her fingers.  She has nocturnal symptoms nearly every night.  She has tried an extension brace with minimal symptom relief.  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   She has no history of diabetes, thyroid disease, cervical spine issue, wrist trauma. ? ? ?Review of Systems ? ? ?Objective: ?Vital Signs: There were no vitals taken for this visit. ? ?Physical  Exam ?Constitutional:   ?   Appearance: Normal appearance.  ?Cardiovascular:  ?   Rate and Rhythm: Normal rate.  ?   Pulses: Normal pulses.  ?Pulmonary:  ?   Effort: Pulmonary effort is normal.  ?Skin: ?   General: Skin is warm and dry.  ?   Capillary Refill: Capillary refill takes less than 2 seconds.  ?Neurological:  ?   Mental Status: She is alert.  ? ? ?Right Hand Exam  ? ?Tenderness  ?The patient is experiencing no tenderness.  ? ?Other  ?Erythema: absent ?Sensation: normal ?Pulse: present ? ?Comments:  + Tinel at wrist and Phalen signs.  4/5 thenar motor strength without atrophy.  ? ? ? ? ?Specialty Comments:  ?No specialty comments available. ? ?Imaging: ?No results found. ? ? ?PMFS History: ?Patient Active Problem List  ? Diagnosis Date Noted  ? Carpal tunnel syndrome, right upper limb 07/09/2021  ? Positive FIT (fecal immunochemical test) 05/30/2021  ? Periodontal disease 05/24/2021  ? Need for hepatitis C screening test 05/23/2021  ? Encounter for screening for HIV 05/23/2021  ? Colon cancer screening 05/23/2021  ? Adjustment disorder with mixed anxiety and depressed mood 05/15/2021  ? Depression with anxiety 05/15/2021  ? Paresthesia 04/04/2021  ? Hypertension 04/04/2021  ? Substance abuse ([place]) 04/04/2021  ? Hyperlipidemia 04/04/2021  ? Positive depression screening 04/04/2021  ? Anemia 04/04/2021  ? COPD with chronic bronchitis ([place]) 01/29/2021  ?  Breast mass in female 02/10/2019  ? Oral candida 02/10/2019  ? Moderate persistent asthma without complication 66/10/3014  ? Tobacco use 01/28/2018  ? Severe cannabis use disorder ([place]) 11/14/2017  ? Alcohol use disorder, severe, dependence ([place]) 11/13/2017  ? ?Past Medical History:  ?Diagnosis Date  ? COPD (chronic obstructive pulmonary disease) ([place])   ? Liver cirrhosis ([place])   ?  ?History reviewed. No pertinent family history.  ?History reviewed. No pertinent surgical history. ?Social History  ? ?Occupational History  ?  Employer: [employer]  ?Tobacco Use   ? Smoking status: Every Day  ?  Packs/day: 0.50  ?  Types: Cigarettes  ? Smokeless tobacco: Never  ?Substance and Sexual Activity  ? Alcohol use: Yes  ?  Alcohol/week: 86.0 standard drinks  ?  Types: 86 Cans of beer per week  ? Drug use: Not Currently  ?  Frequency: 1.0 times per week  ?  Types: "Crack" cocaine, Cocaine, Other-see comments, Marijuana  ?  Comment: 90 days sober [employer]  ? Sexual activity: Not Currently  ? ? ? ? ? ? ?

## 2021-07-09 NOTE — Progress Notes (Signed)
? ?Karen Pham - 46 y.o. female MRN [id number]  Date of birth: October 12, 1975 ? ?Office Visit Note: ?Visit Date: 07/04/2021 ?PCP: Karen Stain, MD ?Referred by: Karen Cooter, MD ? ?Subjective: ?Chief Complaint  ?Patient presents with  ? Right Hand - Pain, Numbness, Tingling  ? Right Elbow - Pain, Numbness, Tingling  ? ?HPI:  Karen Pham is a 46 y.o. female who comes in today at the request of Dr. Sherilyn Pham for electrodiagnostic study of the Bilateral upper extremities.  Patient is Right hand dominant.  She reports about 2 months of 5 out of 10 pain with numbness and tingling in the right hand somewhat more radial digits and somewhat nondermatomal.  This will radiate up to her elbow.  She does get nocturnal complaints.  She reports her fingers can feel cold and hot at times.  She denies any left-sided complaints.  No frank radicular symptoms.  She has a history of liver cirrhosis but no diabetes.  Interestingly she saw Dr. Fredna Pham prior to seeing Dr. Tempie Donning.  His notes were reviewed and he felt like she had potential for carpal tunnel syndrome and did send her for electrodiagnostic study.  She had appointment with Dr. Leta Pham at [facility].  Dr. Leta Pham did see that she had electrodiagnostic study already scheduled in our office and wanted to cancel their appointment for electrodiagnostic study.  At least according to the chart that electrodiagnostic study is still scheduled and I did inform the patient of this.  Dr. Tempie Donning thought she may have some level of epicondylitis and potential carpal or cubital tunnel syndrome. ? ?ROS Otherwise per HPI. ? ?Assessment & Plan: ?Visit Diagnoses:  ?  ICD-10-CM   ?1. Paresthesia of skin  R20.2 NCV with EMG (electromyography)  ?  ?  ?Plan: Impression: ?The above electrodiagnostic study is ABNORMAL and reveals evidence of a moderate right median nerve entrapment at the wrist (carpal tunnel syndrome) affecting sensory and motor components.  There is no significant electrodiagnostic evidence of any other focal nerve entrapment, brachial plexopathy or cervical radiculopathy.  ? ?Recommendations: ?1.  Follow-up with referring physician. ?2.  Continue current management of symptoms. ?3.  Continue use of resting splint at night-time and as needed during the day. ?4.  Suggest surgical evaluation. ? ?Meds & Orders: No orders of the defined types were placed in this encounter. ?  ?Orders Placed This Encounter  ?Procedures  ? NCV with EMG (electromyography)  ?  ?Follow-up: Return in about 2 weeks (around 07/18/2021) for Karen Cooter, MD.  ? ?Procedures: ?No procedures performed  ?EMG & NCV Findings: ?Evaluation of the right median motor nerve showed prolonged distal onset latency (4.3 ms) and decreased conduction velocity (Elbow-Wrist, 44 m/s).  The right median (across palm) sensory nerve showed prolonged distal peak latency (Wrist, 5.1 ms) and prolonged distal peak latency (Palm, 3.0 ms).  All remaining nerves (as indicated in the following tables) were within normal limits.   ? ?All examined muscles (as indicated in the following table) showed no evidence of electrical instability.   ? ?Impression: ?The above electrodiagnostic study is ABNORMAL and reveals evidence of a moderate right median nerve entrapment at the wrist (carpal tunnel syndrome) affecting sensory and motor components. There is no significant electrodiagnostic evidence of any other focal nerve entrapment, brachial plexopathy or cervical radiculopathy.  ? ?Recommendations: ?1.  Follow-up with referring physician. ?2.  Continue current management of symptoms. ?3.  Continue use of resting splint at night-time and as needed during the day. ?  4.  Suggest surgical evaluation. ? ?___________________________ ?Karen Pham FAAPMR ?Board Certified, Tax adviser of Physical Medicine and Rehabilitation ? ? ? ?Nerve Conduction Studies ?Anti Sensory Summary Table ? ? Stim Site NR Peak (ms) Norm Peak  (ms) P-T Amp (?V) Norm P-T Amp Site1 Site2 Delta-P (ms) Dist (cm) Vel (m/s) Norm Vel (m/s)  ?Right Median Acr Palm Anti Sensory (2nd Digit)  30.4?C  ?Wrist    *5.1 <3.6 19.9 >10 Wrist Palm 2.1 0.0    ?Palm    *3.0 <2.0 9.5         ?Right Radial Anti Sensory (Base 1st Digit)  30.9?C  ?Wrist    2.3 <3.1 49.0  Wrist Base 1st Digit 2.3 0.0    ?Right Ulnar Anti Sensory (5th Digit)  31?C  ?Wrist    3.4 <3.7 21.7 >15.0 Wrist 5th Digit 3.4 14.0 41 >38  ? ?Motor Summary Table ? ? Stim Site NR Onset (ms) Norm Onset (ms) O-P Amp (mV) Norm O-P Amp Site1 Site2 Delta-0 (ms) Dist (cm) Vel (m/s) Norm Vel (m/s)  ?Right Median Motor (Abd Poll Brev)  30.8?C  ?Wrist    *4.3 <4.2 5.9 >5 Elbow Wrist 4.1 18.0 *44 >50  ?Elbow    8.4  5.5         ?Right Ulnar Motor (Abd Dig Min)  30.8?C  ?Wrist    3.4 <4.2 8.3 >3 B Elbow Wrist 2.9 18.0 62 >53  ?B Elbow    6.3  8.4  A Elbow B Elbow 1.3 10.0 77 >53  ?A Elbow    7.6  8.3         ? ?EMG ? ? Side Muscle Nerve Root Ins Act Fibs Psw Amp Dur Poly Recrt Int Fraser Din Comment  ?Right Abd Poll Brev Median C8-T1 Nml Nml Nml Nml Nml 0 Nml Nml   ?Right 1stDorInt Ulnar C8-T1 Nml Nml Nml Nml Nml 0 Nml Nml   ?Right PronatorTeres Median C6-7 Nml Nml Nml Nml Nml 0 Nml Nml   ?Right Biceps Musculocut C5-6 Nml Nml Nml Nml Nml 0 Nml Nml   ?Right Deltoid Axillary C5-6 Nml Nml Nml Nml Nml 0 Nml Nml   ? ? ?Nerve Conduction Studies ?Anti Sensory Left/Right Comparison ? ? Stim Site L Lat (ms) R Lat (ms) L-R Lat (ms) L Amp (?V) R Amp (?V) L-R Amp (%) Site1 Site2 L Vel (m/s) R Vel (m/s) L-R Vel (m/s)  ?Median Acr Palm Anti Sensory (2nd Digit)  30.4?C  ?Wrist  *5.1   19.9  Wrist Palm     ?Palm  *3.0   9.5        ?Radial Anti Sensory (Base 1st Digit)  30.9?C  ?Wrist  2.3   49.0  Wrist Base 1st Digit     ?Ulnar Anti Sensory (5th Digit)  31?C  ?Wrist  3.4   21.7  Wrist 5th Digit  41   ? ?Motor Left/Right Comparison ? ? Stim Site L Lat (ms) R Lat (ms) L-R Lat (ms) L Amp (mV) R Amp (mV) L-R Amp (%) Site1 Site2 L Vel (m/s) R Vel  (m/s) L-R Vel (m/s)  ?Median Motor (Abd Poll Brev)  30.8?C  ?Wrist  *4.3   5.9  Elbow Wrist  *44   ?Elbow  8.4   5.5        ?Ulnar Motor (Abd Dig Min)  30.8?C  ?Wrist  3.4   8.3  B Elbow Wrist  62   ?B Elbow  6.3   8.4  A Elbow B Elbow  77   ?A Elbow  7.6   8.3        ? ? ? ?Waveforms: ?    ? ?   ? ?  ? ?Clinical History: ?No specialty comments available.  ? ? ? ?Objective:  VS:  HT:    WT:   BMI:     BP:   HR: bpm  TEMP: ( )  RESP:  ?Physical Exam ?Musculoskeletal:     ?   General: No swelling, tenderness or deformity.  ?   Comments: Inspection reveals no atrophy of the bilateral APB or FDI or hand intrinsics. There is no swelling, color changes, allodynia or dystrophic changes. There is 5 out of 5 strength in the bilateral wrist extension, finger abduction and long finger flexion. There is intact sensation to light touch in all dermatomal and peripheral nerve distributions. There is a positive Phalen's test on the right. There is a negative Hoffmann's test bilaterally.  ?Skin: ?   General: Skin is warm and dry.  ?   Findings: No erythema or rash.  ?Neurological:  ?   General: No focal deficit present.  ?   Mental Status: She is alert and oriented to person, place, and time.  ?   Motor: No weakness or abnormal muscle tone.  ?   Coordination: Coordination normal.  ?Psychiatric:     ?   Mood and Affect: Mood normal.     ?   Behavior: Behavior normal.  ?  ? ?Imaging: ?No results found. ?

## 2021-07-09 NOTE — Telephone Encounter (Signed)
?  Chief Complaint: anxiety ?Symptoms: increased overwhelmed, anxious, medication refill ?Frequency: been ongoing  ?Pertinent Negatives: Patient denies SI or HI ?Disposition: '[]'$ ED /'[]'$ Urgent Care (no appt availability in office) / '[x]'$ Appointment(In office/virtual)/ '[]'$  [facility]/ '[]'$ Home Care/ '[]'$ Refused Recommended Disposition /'[]'$ [facility]/ '[]'$  Follow-up with PCP ?Additional Notes: pt was asking if gabapentin she could take in the morning instead of night time d/t taking other meds at nighttime. Advised her she can take it just to make sure it's once daily and would attempt to send in refill but also scheduled an appt to f/up on getting different medication to help with daytime anxiety.  ? ? ?Reason for Disposition ? [1] Symptoms of anxiety or panic attack AND [2] is a chronic symptom (recurrent or ongoing AND present > 4 weeks) ? ?Answer Assessment - Initial Assessment Questions ?1. CONCERN: "Did anything happen that prompted you to call today?"  ?    Had to dc gabapentin and increased anxiety ?2. ANXIETY SYMPTOMS: "Can you describe how you (your loved one; patient) have been feeling?" (e.g., tense, restless, panicky, anxious, keyed up, overwhelmed, sense of impending doom).  ?    Panicky, anxious, overwhelmed  ?3. ONSET: "How long have you been feeling this way?" (e.g., hours, days, weeks) ?    Long time  ?4. SEVERITY: "How would you rate the level of anxiety?" (e.g., 0 - 10; or mild, moderate, severe). ?    Mild to moderate ?5. FUNCTIONAL IMPAIRMENT: "How have these feelings affected your ability to do daily activities?" "Have you had more difficulty than usual doing your normal daily activities?" (e.g., getting better, same, worse; self-care, school, work, interactions) ?    Feels like medication not helping with taking at bedtime ?6. HISTORY: "Have you felt this way before?" "Have you ever been diagnosed with an anxiety problem in the past?" (e.g., generalized anxiety disorder, panic  attacks, PTSD). If Yes, ask: "How was this problem treated?" (e.g., medicines, counseling, etc.) ?    Yes medicines  ?7. RISK OF HARM - SUICIDAL IDEATION: "Do you ever have thoughts of hurting or killing yourself?" If Yes, ask:  "Do you have these feelings now?" "Do you have a plan on how you would do this?" ?    No ?8. TREATMENT:  "What has been done so far to treat this anxiety?" (e.g., medicines, relaxation strategies). "What has helped?" ?    Medications but not helping  ?11. OTHER SYMPTOMS: "Do you have any other symptoms?" (e.g., feeling depressed, trouble concentrating, trouble sleeping, trouble breathing, palpitations or fast heartbeat, chest pain, sweating, nausea, or diarrhea) ?      Just feeling overwhelmed. ? ?Protocols used: Anxiety and Panic Attack-A-AH ? ?

## 2021-07-09 NOTE — Progress Notes (Signed)
? ?Office Visit Note ?  ?Patient: Karen Pham           ?Date of Birth: 12-15-1975           ?MRN: [id number] ?Visit Date: 07/09/2021 ?             ?Requested by: Elsie Stain, MD ?[phone] ?[place],  [place] [address] ?PCP: Elsie Stain, MD ? ? ?Assessment & Plan: ?Visit Diagnoses:  ?1. Carpal tunnel syndrome, right upper limb   ? ? ?Plan: We discussed the diagnosis, prognosis, and both conservative and operative treatment options for carpal tunnel syndrome.  She has failed night splinting.  She is not interested in corticosteroid injection. ? ?After our discussion, the patient has elected to proceed with carpal tunnel release.  We reviewed the benefits of surgery and the potential risks including, but not limited to, persistent symptoms, infection, damage to nearby nerves and blood vessels, delayed wound healing, need for additional procedures.   ? ?All patient concerns and questions were addressed. ? ?A surgical date will be confirmed with the patient.   ? ?Follow-Up Instructions: No follow-ups on file.  ? ?Orders:  ?No orders of the defined types were placed in this encounter. ? ?No orders of the defined types were placed in this encounter. ? ? ? ? Procedures: ?No procedures performed ? ? ?Clinical Data: ?No additional findings. ? ? ?Subjective: ?Chief Complaint  ?Patient presents with  ? Right Hand - Follow-up  ? ? ?This is a 46 year old right-hand-dominant female who presents for follow up of numbness and tingling of her right hand.  She describes numbness in all of her fingers.  She has nocturnal symptoms nearly every night.  She has tried an extension brace with minimal symptom relief.  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   She has no history of diabetes, thyroid disease, cervical spine issue, wrist trauma. ? ? ?Review of Systems ? ? ?Objective: ?Vital Signs: There were no vitals taken for this visit. ? ?Physical  Exam ?Constitutional:   ?   Appearance: Normal appearance.  ?Cardiovascular:  ?   Rate and Rhythm: Normal rate.  ?   Pulses: Normal pulses.  ?Pulmonary:  ?   Effort: Pulmonary effort is normal.  ?Skin: ?   General: Skin is warm and dry.  ?   Capillary Refill: Capillary refill takes less than 2 seconds.  ?Neurological:  ?   Mental Status: She is alert.  ? ? ?Right Hand Exam  ? ?Tenderness  ?The patient is experiencing no tenderness.  ? ?Other  ?Erythema: absent ?Sensation: normal ?Pulse: present ? ?Comments:  + Tinel at wrist and Phalen signs.  4/5 thenar motor strength without atrophy.  ? ? ? ? ?Specialty Comments:  ?No specialty comments available. ? ?Imaging: ?No results found. ? ? ?PMFS History: ?Patient Active Problem List  ? Diagnosis Date Noted  ? Carpal tunnel syndrome, right upper limb 07/09/2021  ? Positive FIT (fecal immunochemical test) 05/30/2021  ? Periodontal disease 05/24/2021  ? Need for hepatitis C screening test 05/23/2021  ? Encounter for screening for HIV 05/23/2021  ? Colon cancer screening 05/23/2021  ? Adjustment disorder with mixed anxiety and depressed mood 05/15/2021  ? Depression with anxiety 05/15/2021  ? Paresthesia 04/04/2021  ? Hypertension 04/04/2021  ? Substance abuse ([place]) 04/04/2021  ? Hyperlipidemia 04/04/2021  ? Positive depression screening 04/04/2021  ? Anemia 04/04/2021  ? 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  Breast mass in female 02/10/2019  ? Oral candida 02/10/2019  ? Moderate persistent asthma without complication 96/75/9163  ? Tobacco use 01/28/2018  ? Severe cannabis use disorder ([place]) 11/14/2017  ? Alcohol use disorder, severe, dependence ([place]) 11/13/2017  ? ?Past Medical History:  ?Diagnosis Date  ? COPD (chronic obstructive pulmonary disease) ([place])   ? Liver cirrhosis ([place])   ?  ?History reviewed. No pertinent family history.  ?History reviewed. No pertinent surgical history. ?Social History  ? ?Occupational History  ?  Employer: [employer]  ?Tobacco Use   ? Smoking status: Every Day  ?  Packs/day: 0.50  ?  Types: Cigarettes  ? Smokeless tobacco: Never  ?Substance and Sexual Activity  ? Alcohol use: Yes  ?  Alcohol/week: 86.0 standard drinks  ?  Types: 86 Cans of beer per week  ? Drug use: Not Currently  ?  Frequency: 1.0 times per week  ?  Types: "Crack" cocaine, Cocaine, Other-see comments, Marijuana  ?  Comment: 90 days sober [employer]  ? Sexual activity: Not Currently  ? ? ? ? ? ? ?

## 2021-07-10 ENCOUNTER — Telehealth: Payer: Self-pay | Admitting: Orthopedic Surgery

## 2021-07-10 ENCOUNTER — Ambulatory Visit: Payer: Managed Care, Other (non HMO) | Admitting: Orthopedic Surgery

## 2021-07-10 NOTE — Telephone Encounter (Signed)
Patient is scheduled for surgery Monday 07/16/21. Patient would like to know if she can get a work note to excuse her from work if she has to miss any days between now and surgery due to hand pain? Please call to advise. ?

## 2021-07-11 ENCOUNTER — Other Ambulatory Visit: Payer: Self-pay

## 2021-07-11 ENCOUNTER — Encounter (HOSPITAL_BASED_OUTPATIENT_CLINIC_OR_DEPARTMENT_OTHER): Payer: Self-pay | Admitting: Orthopedic Surgery

## 2021-07-11 NOTE — Telephone Encounter (Signed)
Note entered in system. Patient advised. She will pick up copy of note on Friday. ?

## 2021-07-11 NOTE — Progress Notes (Signed)
Chart reviewed with Dr. Roanna Banning. Ok to proceed with surgery as planned at [facility]. ?

## 2021-07-13 ENCOUNTER — Other Ambulatory Visit: Payer: Self-pay

## 2021-07-13 ENCOUNTER — Encounter (HOSPITAL_BASED_OUTPATIENT_CLINIC_OR_DEPARTMENT_OTHER)
Admission: RE | Admit: 2021-07-13 | Discharge: 2021-07-13 | Disposition: A | Discharge status: Home / Self Care | Payer: Commercial Managed Care - HMO | Source: Ambulatory Visit | Attending: Orthopedic Surgery | Admitting: Orthopedic Surgery

## 2021-07-13 DIAGNOSIS — G5601 Carpal tunnel syndrome, right upper limb: Secondary | ICD-10-CM | POA: Diagnosis present

## 2021-07-13 DIAGNOSIS — J449 Chronic obstructive pulmonary disease, unspecified: Secondary | ICD-10-CM | POA: Diagnosis not present

## 2021-07-13 DIAGNOSIS — Z01812 Encounter for preprocedural laboratory examination: Secondary | ICD-10-CM | POA: Diagnosis not present

## 2021-07-13 DIAGNOSIS — I10 Essential (primary) hypertension: Secondary | ICD-10-CM | POA: Diagnosis not present

## 2021-07-13 DIAGNOSIS — F17210 Nicotine dependence, cigarettes, uncomplicated: Secondary | ICD-10-CM | POA: Diagnosis not present

## 2021-07-13 LAB — COMPREHENSIVE METABOLIC PANEL
ALT: 13 U/L (ref 0–44)
AST: 19 U/L (ref 15–41)
Albumin: 3.7 g/dL (ref 3.5–5.0)
Alkaline Phosphatase: 40 U/L (ref 38–126)
Anion gap: 9 (ref 5–15)
BUN: 10 mg/dL (ref 6–20)
CO2: 22 mmol/L (ref 22–32)
Calcium: 9.1 mg/dL (ref 8.9–10.3)
Chloride: 104 mmol/L (ref 98–111)
Creatinine, Ser: 0.88 mg/dL (ref 0.44–1.00)
GFR, Estimated: >60 mL/min (ref >60)
Glucose, Bld: 102 mg/dL — ABNORMAL HIGH (ref 70–99)
Potassium: 4.3 mmol/L (ref 3.5–5.1)
Sodium: 135 mmol/L (ref 135–145)
Total Bilirubin: 0.5 mg/dL (ref 0.3–1.2)
Total Protein: 6.1 g/dL — ABNORMAL LOW (ref 6.5–8.1)

## 2021-07-13 NOTE — Progress Notes (Signed)
Called to remind pt to come in for pre-op labwork today. ?

## 2021-07-16 ENCOUNTER — Ambulatory Visit (HOSPITAL_BASED_OUTPATIENT_CLINIC_OR_DEPARTMENT_OTHER): Payer: Commercial Managed Care - HMO | Admitting: Anesthesiology

## 2021-07-16 ENCOUNTER — Ambulatory Visit (HOSPITAL_BASED_OUTPATIENT_CLINIC_OR_DEPARTMENT_OTHER)
Admission: RE | Admit: 2021-07-16 | Discharge: 2021-07-16 | Disposition: A | Discharge status: Home / Self Care | Payer: Commercial Managed Care - HMO | Attending: Orthopedic Surgery | Admitting: Orthopedic Surgery

## 2021-07-16 ENCOUNTER — Telehealth: Payer: Self-pay | Admitting: Orthopedic Surgery

## 2021-07-16 ENCOUNTER — Encounter (HOSPITAL_BASED_OUTPATIENT_CLINIC_OR_DEPARTMENT_OTHER): Payer: Self-pay | Admitting: Orthopedic Surgery

## 2021-07-16 ENCOUNTER — Encounter (HOSPITAL_BASED_OUTPATIENT_CLINIC_OR_DEPARTMENT_OTHER)
Admission: RE | Disposition: A | Discharge status: Home / Self Care | Payer: Self-pay | Source: Home / Self Care | Attending: Orthopedic Surgery

## 2021-07-16 ENCOUNTER — Encounter: Payer: Self-pay | Admitting: Radiology

## 2021-07-16 ENCOUNTER — Other Ambulatory Visit: Payer: Self-pay

## 2021-07-16 ENCOUNTER — Telehealth: Payer: Self-pay

## 2021-07-16 DIAGNOSIS — K7030 Alcoholic cirrhosis of liver without ascites: Secondary | ICD-10-CM

## 2021-07-16 DIAGNOSIS — G5601 Carpal tunnel syndrome, right upper limb: Secondary | ICD-10-CM | POA: Insufficient documentation

## 2021-07-16 DIAGNOSIS — J449 Chronic obstructive pulmonary disease, unspecified: Secondary | ICD-10-CM | POA: Insufficient documentation

## 2021-07-16 DIAGNOSIS — I10 Essential (primary) hypertension: Secondary | ICD-10-CM | POA: Diagnosis not present

## 2021-07-16 DIAGNOSIS — D649 Anemia, unspecified: Secondary | ICD-10-CM | POA: Diagnosis not present

## 2021-07-16 DIAGNOSIS — F17210 Nicotine dependence, cigarettes, uncomplicated: Secondary | ICD-10-CM | POA: Insufficient documentation

## 2021-07-16 HISTORY — DX: Essential (primary) hypertension: I10

## 2021-07-16 HISTORY — PX: CARPAL TUNNEL RELEASE: SHX101

## 2021-07-16 LAB — POCT PREGNANCY, URINE: Preg Test, Ur: NEGATIVE

## 2021-07-16 SURGERY — CARPAL TUNNEL RELEASE
Anesthesia: Regional | Site: Hand | Laterality: Right

## 2021-07-16 MED ORDER — PROPOFOL 500 MG/50ML IV EMUL
INTRAVENOUS | Status: DC | PRN
  Administered 2021-07-16: 75 ug/kg/min via INTRAVENOUS

## 2021-07-16 MED ORDER — ONDANSETRON HCL 4 MG/2ML IJ SOLN
INTRAMUSCULAR | Status: DC | PRN
  Administered 2021-07-16: 4 mg via INTRAVENOUS

## 2021-07-16 MED ORDER — LACTATED RINGERS IV SOLN: INTRAVENOUS | Status: DC

## 2021-07-16 MED ORDER — FENTANYL CITRATE (PF) 100 MCG/2ML IJ SOLN: 25.0000 ug | INTRAMUSCULAR | Status: DC | PRN

## 2021-07-16 MED ORDER — ACETAMINOPHEN 500 MG PO TABS
1000.0000 mg | ORAL_TABLET | Freq: Once | ORAL | Status: AC
  Administered 2021-07-16: 1000 mg via ORAL

## 2021-07-16 MED ORDER — CELECOXIB 200 MG PO CAPS
ORAL_CAPSULE | ORAL | Status: AC
Start: 2021-07-16 — End: ?
  Filled 2021-07-16: qty 1

## 2021-07-16 MED ORDER — MIDAZOLAM HCL 2 MG/2ML IJ SOLN
INTRAMUSCULAR | Status: AC
  Filled 2021-07-16: qty 2

## 2021-07-16 MED ORDER — BUPIVACAINE HCL (PF) 0.25 % IJ SOLN
INTRAMUSCULAR | Status: DC | PRN
  Administered 2021-07-16: 10 mL

## 2021-07-16 MED ORDER — BUPIVACAINE HCL (PF) 0.25 % IJ SOLN
INTRAMUSCULAR | Status: AC
  Filled 2021-07-16: qty 90

## 2021-07-16 MED ORDER — 0.9 % SODIUM CHLORIDE (POUR BTL) OPTIME
TOPICAL | Status: DC | PRN
Start: 2021-07-16 — End: 2021-07-16
  Administered 2021-07-16: 60 mL

## 2021-07-16 MED ORDER — ACETAMINOPHEN 500 MG PO TABS
ORAL_TABLET | ORAL | Status: AC
Start: 2021-07-16 — End: ?
  Filled 2021-07-16: qty 2

## 2021-07-16 MED ORDER — FENTANYL CITRATE (PF) 100 MCG/2ML IJ SOLN
INTRAMUSCULAR | Status: AC
  Filled 2021-07-16: qty 2

## 2021-07-16 MED ORDER — DEXMEDETOMIDINE (PRECEDEX) IN NS 20 MCG/5ML (4 MCG/ML) IV SYRINGE
PREFILLED_SYRINGE | INTRAVENOUS | Status: AC
  Filled 2021-07-16: qty 5

## 2021-07-16 MED ORDER — CELECOXIB 200 MG PO CAPS
200.0000 mg | ORAL_CAPSULE | Freq: Once | ORAL | Status: AC
  Administered 2021-07-16: 200 mg via ORAL

## 2021-07-16 MED ORDER — FENTANYL CITRATE (PF) 100 MCG/2ML IJ SOLN
INTRAMUSCULAR | Status: DC | PRN
  Administered 2021-07-16: 50 ug via INTRAVENOUS

## 2021-07-16 MED ORDER — MIDAZOLAM HCL 2 MG/2ML IJ SOLN
INTRAMUSCULAR | Status: DC | PRN
  Administered 2021-07-16: 2 mg via INTRAVENOUS

## 2021-07-16 SURGICAL SUPPLY — 34 items
APL PRP STRL LF DISP 70% ISPRP (MISCELLANEOUS) ×1
BLADE SURG 15 STRL LF DISP TIS (BLADE) ×1 IMPLANT
BLADE SURG 15 STRL SS (BLADE) ×2
BNDG CMPR 9X4 STRL LF SNTH (GAUZE/BANDAGES/DRESSINGS) ×1
BNDG ELASTIC 3X5.8 VLCR STR LF (GAUZE/BANDAGES/DRESSINGS) ×2 IMPLANT
BNDG ESMARK 4X9 LF (GAUZE/BANDAGES/DRESSINGS) ×2 IMPLANT
BNDG GAUZE ELAST 4 BULKY (GAUZE/BANDAGES/DRESSINGS) ×2 IMPLANT
CHLORAPREP W/TINT 26 (MISCELLANEOUS) ×2 IMPLANT
CORD BIPOLAR FORCEPS 12FT (ELECTRODE) ×2 IMPLANT
COVER BACK TABLE 60X90IN (DRAPES) ×2 IMPLANT
COVER MAYO STAND STRL (DRAPES) ×2 IMPLANT
DRAPE EXTREMITY T 121X128X90 (DISPOSABLE) ×2 IMPLANT
DRAPE U-SHAPE 47X51 STRL (DRAPES) ×1 IMPLANT
GAUZE SPONGE 4X4 12PLY STRL (GAUZE/BANDAGES/DRESSINGS) ×2 IMPLANT
GAUZE XEROFORM 1X8 LF (GAUZE/BANDAGES/DRESSINGS) ×1 IMPLANT
GLOVE SURG POLYISO LF SZ6.5 (GLOVE) ×1 IMPLANT
GLOVE SURG POLYISO LF SZ7 (GLOVE) ×1 IMPLANT
GLOVE SURG UNDER POLY LF SZ6.5 (GLOVE) ×1 IMPLANT
GLOVE SURG UNDER POLY LF SZ7 (GLOVE) ×2 IMPLANT
GOWN STRL REUS W/ TWL LRG LVL3 (GOWN DISPOSABLE) ×1 IMPLANT
GOWN STRL REUS W/TWL LRG LVL3 (GOWN DISPOSABLE) ×4
GOWN STRL REUS W/TWL XL LVL3 (GOWN DISPOSABLE) ×2 IMPLANT
NDL HYPO 25X1 1.5 SAFETY (NEEDLE) IMPLANT
NEEDLE HYPO 25X1 1.5 SAFETY (NEEDLE) ×2 IMPLANT
NS IRRIG 1000ML POUR BTL (IV SOLUTION) ×2 IMPLANT
PACK BASIN DAY SURGERY FS (CUSTOM PROCEDURE TRAY) ×2 IMPLANT
PAD CAST 3X4 CTTN HI CHSV (CAST SUPPLIES) ×1 IMPLANT
PADDING CAST COTTON 3X4 STRL (CAST SUPPLIES) ×2
SLEEVE SCD COMPRESS KNEE MED (STOCKING) ×1 IMPLANT
SUT ETHILON 4 0 PS 2 18 (SUTURE) ×2 IMPLANT
SYR BULB EAR ULCER 3OZ GRN STR (SYRINGE) ×2 IMPLANT
SYR CONTROL 10ML LL (SYRINGE) ×1 IMPLANT
TOWEL GREEN STERILE FF (TOWEL DISPOSABLE) ×4 IMPLANT
UNDERPAD 30X36 HEAVY ABSORB (UNDERPADS AND DIAPERS) ×2 IMPLANT

## 2021-07-16 NOTE — Anesthesia Postprocedure Evaluation (Signed)
Anesthesia Post Note ? ?Patient: Magdeline Prange ? ?Procedure(s) Performed: RIGHT CARPAL TUNNEL RELEASE (Right: Hand) ? ?  ? ?Patient location during evaluation: PACU ?Anesthesia Type: Bier Block ?Level of consciousness: awake and alert ?Pain management: pain level controlled ?Vital Signs Assessment: post-procedure vital signs reviewed and stable ?Respiratory status: spontaneous breathing, nonlabored ventilation and respiratory function stable ?Cardiovascular status: stable and blood pressure returned to baseline ?Postop Assessment: no apparent nausea or vomiting ?Anesthetic complications: no ? ? ?No notable events documented. ? ?Last Vitals:  ?Vitals:  ? 07/16/21 1245 07/16/21 1300  ?BP: (!) 151/80   ?Pulse: 65   ?Resp: 19 15  ?Temp:    ?SpO2: 97% 98%  ?  ?Last Pain:  ?Vitals:  ? 07/16/21 1300  ?TempSrc:   ?PainSc: 0-No pain  ? ? ?  ?  ?  ?  ?  ?  ? ?Tyjai Charbonnet,W. EDMOND ? ? ? ? ?

## 2021-07-16 NOTE — Interval H&P Note (Signed)
History and Physical Interval Note: ? ?07/16/2021 ?11:48 AM ? ?Karen Pham  has presented today for surgery, with the diagnosis of RIGHT CARPAL TUNNEL SYNDROME.  The various methods of treatment have been discussed with the patient and family. After consideration of risks, benefits and other options for treatment, the patient has consented to  Procedure(s): ?RIGHT CARPAL TUNNEL RELEASE (Right) as a surgical intervention.  The patient's history has been reviewed, patient examined, no change in status, stable for surgery.  I have reviewed the patient's chart and labs.  Questions were answered to the patient's satisfaction.   ? ? ? Leovardo Thoman ? ? ?

## 2021-07-16 NOTE — Op Note (Signed)
? ?  Date of Surgery: 07/16/2021 ? ?INDICATIONS: Karen Pham is a 47 y.o.-year-old female with right carpal tunnel syndrome that was confirmed by electrodiagnostic study and has failed conservative management.  Risks, benefits, and alternatives to surgery were again discussed with the patient wishing to proceed with surgery.  Informed consent was signed after our discussion.  ? ?PREOPERATIVE DIAGNOSIS:  ?Right carpal tunnel syndrome ? ?POSTOPERATIVE DIAGNOSIS: Same. ? ?PROCEDURE:  ?Right carpal tunnel release ? ? ?SURGEON: Audria Nine, M.D. ? ?ASSIST:  ? ?ANESTHESIA:  Local, MAC ? ?IV FLUIDS AND URINE: See anesthesia. ? ?ESTIMATED BLOOD LOSS: 1 mL. ? ?IMPLANTS: * No implants in log *  ? ?DRAINS: None   ? ?COMPLICATIONS: None ? ?DESCRIPTION OF PROCEDURE: The patient was met in the preoperative holding area where the surgical site was marked and the consent form was verified.  The patient was then taken to the operating room and transferred to the operating table.  All bony prominences were well padded.  A tourniquet was applied to the right forearm.  Monitored sedation was induced.   A formal time-out was performed to confirm that this was the correct patient, surgery, side, and site. A local block was performed using 0.25% plain bupivicaine. The operative extremity was prepped and draped in the usual and sterile fashion.   ? ?Following a second timeout, the limb was exsanguinated and the tourniquet inflated to 250 mmHg.  A longitudinal incision was made in line with the radial border of the ring finger from distal to the wrist flexion crease to the intersection of Kaplan's cardinal line.  The skin and subcutaneous tissue was sharply divided.  The longitudinally running palmar fascia was incised.  The thenar musculature was bluntly swept off of the transverse carpal ligament.  The ligament was divided from proximal to distal until the fat surrounding the palmar arch was encountered. Retractors were then placed in the  proximal aspect of the wound to visualize the distal antebrachial fascia.  The fascia was sharply divided using a scissor under direct visualization.   The wound was then thoroughly irrigated with sterile saline.  The tourniquet was deflated.  Hemostasis was achieved with direct pressure and bipolar electrocautery.  The wound was then closed with 4-0 nylon sutures in a horizontal mattress fashion. The wound was then dressed with xeroform, folded kerlix, and an ace wrap. ? ?The patient was then reversed from anesthesia and transferred to the postoperative bed.  All counts were correct x 2 at the end of the procedure.  The patient was taken to the recovery unit in stable condition.   ? ? ? ?POSTOPERATIVE PLAN: She will be discharged to home with appropriate pain medication and discharge instructions.  I will see her back in the office in 10-14 days for her first postop visit.  ? ?Audria Nine, MD ?12:35 PM  ?

## 2021-07-16 NOTE — Anesthesia Preprocedure Evaluation (Addendum)
Anesthesia Evaluation  ?Patient identified by MRN, date of birth, ID band ?Patient awake ? ? ? ?Reviewed: ?Allergy & Precautions, H&P , NPO status , Patient's Chart, lab work & pertinent test results ? ?Airway ?Mallampati: II ? ?TM Distance: >3 FB ?Neck ROM: Full ? ? ? Dental ?no notable dental hx. ?(+) Poor Dentition, Dental Advisory Given ?  ?Pulmonary ?asthma , COPD,  COPD inhaler, Current SmokerPatient did not abstain from smoking.,  ?  ?Pulmonary exam normal ?breath sounds clear to auscultation ? ? ? ? ? ? Cardiovascular ?hypertension, Pt. on medications ? ?Rhythm:Regular Rate:Normal ? ? ?  ?Neuro/Psych ?Anxiety Depression negative neurological ROS ?   ? GI/Hepatic ?negative GI ROS, Neg liver ROS,   ?Endo/Other  ?negative endocrine ROS ? Renal/GU ?negative Renal ROS  ?negative genitourinary ?  ?Musculoskeletal ? ? Abdominal ?  ?Peds ? Hematology ? ?(+) Blood dyscrasia, anemia ,   ?Anesthesia Other Findings ? ? Reproductive/Obstetrics ?negative OB ROS ? ?  ? ? ? ? ? ? ? ? ? ? ? ? ? ?  ?  ? ? ? ? ? ? ? ?Anesthesia Physical ?Anesthesia Plan ? ?ASA: 3 ? ?Anesthesia Plan: MAC  ? ?Post-op Pain Management: Tylenol PO (pre-op)* and Celebrex PO (pre-op)*  ? ?Induction: Intravenous ? ?PONV Risk Score and Plan: 2 and Propofol infusion, Ondansetron and Midazolam ? ?Airway Management Planned: Natural Airway and Simple Face Mask ? ?Additional Equipment:  ? ?Intra-op Plan:  ? ?Post-operative Plan:  ? ?Informed Consent: I have reviewed the patients History and Physical, chart, labs and discussed the procedure including the risks, benefits and alternatives for the proposed anesthesia with the patient or authorized representative who has indicated his/her understanding and acceptance.  ? ? ? ?Dental advisory given ? ?Plan Discussed with: CRNA ? ?Anesthesia Plan Comments:   ? ? ? ? ? ?Anesthesia Quick Evaluation ? ?

## 2021-07-16 NOTE — Telephone Encounter (Signed)
Note written

## 2021-07-16 NOTE — Discharge Instructions (Addendum)
? ?Audria Nine, M.D. ?Hand Surgery ? ?POST-OPERATIVE DISCHARGE INSTRUCTIONS ? ? ?PRESCRIPTIONS: ?- You may have been given a prescription to be taken as directed for post-operative pain control.  You may also take over the counter ibuprofen/aleve and tylenol for pain. Take this as directed on the packaging. Do not exceed 3000 mg tylenol/acetaminophen in 24 hours. ? ?Ibuprofen 600-800 mg (3-4) tablets by mouth every 6 hours as needed for pain.  ? ?OR ? ?Aleve 2 tablets by mouth every 12 hours (twice daily) as needed for pain. ?  ?AND/OR ? ?Tylenol 1000 mg (2 tablets) every 8 hours as needed for pain. ? ?- Please use your pain medication carefully, as refills are limited and you may not be provided with one.  As stated above, please use over the counter pain medicine - it will also be helpful with decreasing your swelling.  ? ? ?ANESTHESIA: ?-After your surgery, post-surgical discomfort or pain is likely. This discomfort can last several days to a few weeks. At certain times of the day your discomfort may be more intense.  ? ?Did you receive a nerve block?  ? ?- A nerve block can provide pain relief for one hour to two days after your surgery. As long as the nerve block is working, you will experience little or no sensation in the area the surgeon operated on.  ?- As the nerve block wears off, you will begin to experience pain or discomfort. It is very important that you begin taking your prescribed pain medication before the nerve block fully wears off. Treating your pain at the first sign of the block wearing off will ensure your pain is better controlled and more tolerable when full-sensation returns. Do not wait until the pain is intolerable, as the medicine will be less effective. It is better to treat pain in advance than to try and catch up.  ? ?General Anesthesia:  ?If you did not receive a nerve block during your surgery, you will need to start taking your pain medication shortly after your surgery and  should continue to do so as prescribed by your surgeon.   ? ? ?ICE AND ELEVATION: ?- You may use ice for the first 48-72 hours, but it is not critical.   ?- Motion of your fingers is very important to decrease the swelling.  ?- Elevation, as much as possible for the next 48 hours, is critical for decreasing swelling as well as for pain relief. Elevation means when you are seated or lying down, you hand should be at or above your heart. When walking, the hand needs to be at or above the level of your elbow.  ?- If the bandage gets too tight, it may need to be loosened. Please contact our office and we will instruct you in how to do this.  ? ? ?SURGICAL BANDAGES:  ?- Keep your dressing and/or splint clean and dry at all times.  You can remove your dressing 5 days from now and change with a dry dressing or Band-Aids as needed thereafter. ?- You may place a plastic bag over your bandage to shower, but be careful, do not get your bandages wet.  ?- After the bandages have been removed, it is OK to get the stitches wet in a shower or with hand washing. Do Not soak or submerge the wound yet. Please do not use lotions or creams on the stitches.   ?  ? ?HAND THERAPY:  ?- You may not need any. If you  do, we will begin this at your follow up visit in the clinic.  ? ? ?ACTIVITY AND WORK: ?- You are encouraged to move any fingers which are not in the bandage.  ?- Light use of the fingers is allowed to assist the other hand with daily hygiene and eating, but strong gripping or lifting is often uncomfortable and should be avoided.  ?- You might miss a variable period of time from work and hopefully this issue has been discussed prior to surgery. You may not do any heavy work with your affected hand for about 2 weeks.  ? ? ?[facility] ?[address] ?[place],  [place]  [address] ?[phone]  ? ? ?Post Anesthesia Home Care Instructions ? ?Activity: ?Get plenty of rest for the remainder of the day. A  responsible individual must stay with you for 24 hours following the procedure.  ?For the next 24 hours, DO NOT: ?-Drive a car ?-Paediatric nurse ?-Drink alcoholic beverages ?-Take any medication unless instructed by your physician ?-Make any legal decisions or sign important papers. ? ?Meals: ?Start with liquid foods such as gelatin or soup. Progress to regular foods as tolerated. Avoid greasy, spicy, heavy foods. If nausea and/or vomiting occur, drink only clear liquids until the nausea and/or vomiting subsides. Call your physician if vomiting continues. ? ?Special Instructions/Symptoms: ?Your throat may feel dry or sore from the anesthesia or the breathing tube placed in your throat during surgery. If this causes discomfort, gargle with warm salt water. The discomfort should disappear within 24 hours. ? ?If you had a scopolamine patch placed behind your ear for the management of post- operative nausea and/or vomiting: ? ?1. The medication in the patch is effective for 72 hours, after which it should be removed.  Wrap patch in a tissue and discard in the trash. Wash hands thoroughly with soap and water. ?2. You may remove the patch earlier than 72 hours if you experience unpleasant side effects which may include dry mouth, dizziness or visual disturbances. ?3. Avoid touching the patch. Wash your hands with soap and water after contact with the patch. ? ?No tylenol until after 5:30 if needed today. ?    ?

## 2021-07-16 NOTE — Brief Op Note (Signed)
07/16/2021 ? ?12:35 PM ? ?PATIENT:  Karen Pham  46 y.o. female ? ?PRE-OPERATIVE DIAGNOSIS:  RIGHT CARPAL TUNNEL SYNDROME ? ?POST-OPERATIVE DIAGNOSIS:  RIGHT CARPAL TUNNEL SYNDROME ? ?PROCEDURE:  Procedure(s) with comments: ?RIGHT CARPAL TUNNEL RELEASE (Right) - regional with monitored anesthesia care ? ?SURGEON:  Surgeon(s) and Role: ?   * Sherilyn Cooter, MD - Primary ? ?PHYSICIAN ASSISTANT:  ? ?ASSISTANTS: none  ? ?ANESTHESIA:   local and MAC ? ?EBL:  1 mL  ? ?BLOOD ADMINISTERED:none ? ?DRAINS: none  ? ?LOCAL MEDICATIONS USED:  BUPIVICAINE  ? ?SPECIMEN:  No Specimen ? ?DISPOSITION OF SPECIMEN:  N/A ? ?COUNTS:  YES ? ?TOURNIQUET:   ?Total Tourniquet Time Documented: ?Forearm (Right) - 6 minutes ?Total: Forearm (Right) - 6 minutes ? ? ?DICTATION: .Dragon Dictation ? ?PLAN OF CARE: Discharge to home after PACU ? ?PATIENT DISPOSITION:  PACU - hemodynamically stable. ?  ?Delay start of Pharmacological VTE agent (>24hrs) due to surgical blood loss or risk of bleeding: not applicable ? ?

## 2021-07-16 NOTE — Transfer of Care (Signed)
Immediate Anesthesia Transfer of Care Note ? ?Patient: Karen Pham ? ?Procedure(s) Performed: RIGHT CARPAL TUNNEL RELEASE (Right: Hand) ? ?Patient Location: PACU ? ?Anesthesia Type:MAC ? ?Level of Consciousness: awake, alert  and oriented ? ?Airway & Oxygen Therapy: Patient Spontanous Breathing and Patient connected to face mask oxygen ? ?Post-op Assessment: Report given to RN and Post -op Vital signs reviewed and stable ? ?Post vital signs: Reviewed and stable ? ?Last Vitals:  ?Vitals Value Taken Time  ?BP 149/79 07/16/21 1239  ?Temp    ?Pulse 65 07/16/21 1239  ?Resp 14 07/16/21 1239  ?SpO2 100 % 07/16/21 1239  ?Vitals shown include unvalidated device data. ? ?Last Pain:  ?Vitals:  ? 07/16/21 1112  ?TempSrc: Oral  ?PainSc: 0-No pain  ?   ? ?  ? ?Complications: No notable events documented. ?

## 2021-07-16 NOTE — Telephone Encounter (Signed)
Pts friend is calling advising that the patient needs a work note that she will be out thru Post op appt. ? ?Pt will come by and pick up once they leave the surgery center ?

## 2021-07-17 ENCOUNTER — Telehealth: Payer: Managed Care, Other (non HMO) | Admitting: Nurse Practitioner

## 2021-07-17 ENCOUNTER — Encounter (HOSPITAL_BASED_OUTPATIENT_CLINIC_OR_DEPARTMENT_OTHER): Payer: Self-pay | Admitting: Orthopedic Surgery

## 2021-07-19 ENCOUNTER — Telehealth: Payer: Self-pay

## 2021-07-19 ENCOUNTER — Other Ambulatory Visit: Payer: Self-pay | Admitting: Orthopedic Surgery

## 2021-07-19 MED ORDER — TRAMADOL HCL 50 MG PO TABS
50.0000 mg | ORAL_TABLET | Freq: Four times a day (QID) | ORAL | 0 refills | Status: AC | PRN
Start: 2021-07-19 — End: 2021-07-26

## 2021-07-19 NOTE — Telephone Encounter (Signed)
Patient called into the office and would like to know if she can have something called in for pain?  ?

## 2021-07-19 NOTE — Telephone Encounter (Signed)
Lmom for patient to let her know meds were sent in ?

## 2021-07-23 ENCOUNTER — Ambulatory Visit (INDEPENDENT_AMBULATORY_CARE_PROVIDER_SITE_OTHER): Payer: 59 | Admitting: Licensed Clinical Social Worker

## 2021-07-23 ENCOUNTER — Other Ambulatory Visit: Payer: Self-pay

## 2021-07-23 DIAGNOSIS — F4323 Adjustment disorder with mixed anxiety and depressed mood: Secondary | ICD-10-CM | POA: Diagnosis not present

## 2021-07-23 NOTE — Progress Notes (Signed)
? ?  THERAPIST PROGRESS NOTE ? ?Session Time: 30  ? ?Participation Level: Active ? ?Behavioral Response: CasualAlertAnxious and Depressed ? ?Type of Therapy: Individual Therapy ? ?Treatment Goals addressed: Patient will score less than 5 on the Generalized Anxiety Disorder 7 Scale (GAD-7) ? ?ProgressTowards Goals: Progressing ? ?Interventions: CBT and Motivational Interviewing ? ?Summary: Karen Pham is a 46 y.o. female who presents with depressed and anxious mood\affect.  Patient was pleasant, cooperative, maintained good eye contact.  She engaged well in therapy session was dressed casually. ? Primary stressors for patient are sobriety, illness, financials, and housing.  Patient reports that she is currently in sober living at [facility].  Patient reports that she has tension and worry with her roommates as that they can be negative.  Patient reports increased isolation in her room to avoid negative behaviors of her roommates.  Patient reports frustration as she would like to have good relationships with her roommates.  Patient reports that she is attending weekly alcohol Anonymous meetings, however certain participants do not believe any external sources such as individual therapy to help guide sobriety.  Patient reports feeling judged at times in [facility] meetings, thus why she wants to continue on with individual therapy.  Patient reports stress and tension due to illness she recently had carpal tunnel surgery and her hand is currently wrapped in an Ace bandage while it heals.  Patient reports that she has not been able to work and states that this has been frustrating financially. ? ?Suicidal/Homicidal: Nowithout intent/plan ? ?Therapist Response:  ? ? Intervention/Plan: LCSW administered supportive therapy, as a person centered therapy and cognitive behavioral therapy.  LCSW administered the PHQ-9 and went over the results with the patient.  Patient increase score from a 9 to a 12 in today's session  goal\objective is keeping PHQ-9 below 10.  LCSW administered GAD-7 and patient decreased her score by one-point from an 8 to a 7.  Goal and objective is below a 5 for GAD-7. ? ?Plan: Return again in 4 weeks. ? ?Diagnosis: Adjustment disorder with mixed anxiety and depressed mood ? ?Collaboration of Care: Other none in today's session  ? ?Patient/Guardian was advised Release of Information must be obtained prior to any record release in order to collaborate their care with an outside provider. Patient/Guardian was advised if they have not already done so to contact the registration department to sign all necessary forms in order for [place] to release information regarding their care.  ? ?Consent: Patient/Guardian gives verbal consent for treatment and assignment of benefits for services provided during this visit. Patient/Guardian expressed understanding and agreed to proceed.  ? ?Dory Horn, LCSW ?07/23/2021 ? ?

## 2021-07-23 NOTE — Plan of Care (Signed)
Pt made progress to GAD-7 goal of below a 5 by decreasing score by 1 point.  ?

## 2021-07-24 ENCOUNTER — Ambulatory Visit: Payer: Managed Care, Other (non HMO) | Attending: Nurse Practitioner | Admitting: Nurse Practitioner

## 2021-07-24 ENCOUNTER — Encounter: Payer: Self-pay | Admitting: Nurse Practitioner

## 2021-07-24 ENCOUNTER — Other Ambulatory Visit: Payer: Self-pay

## 2021-07-24 DIAGNOSIS — F32A Depression, unspecified: Secondary | ICD-10-CM

## 2021-07-24 DIAGNOSIS — F419 Anxiety disorder, unspecified: Secondary | ICD-10-CM | POA: Diagnosis not present

## 2021-07-24 MED ORDER — GABAPENTIN 100 MG PO CAPS
100.0000 mg | ORAL_CAPSULE | Freq: Three times a day (TID) | ORAL | 0 refills | Status: DC
  Filled 2021-07-24: qty 90, 30d supply, fill #0

## 2021-07-24 NOTE — Progress Notes (Signed)
Virtual Visit via Telephone Note ?Due to national recommendations of social distancing due to [place] 19, telehealth visit is felt to be most appropriate for this patient at this time.  I discussed the limitations, risks, security and privacy concerns of performing an evaluation and management service by telephone and the availability of in person appointments. I also discussed with the patient that there may be a patient responsible charge related to this service. The patient expressed understanding and agreed to proceed.  ? ? ?I connected with Karen Pham on 07/24/21  at  10:10 AM EDT  EDT by telephone and verified that I am speaking with the correct person using two identifiers. ? ?Location of Patient: ?Private Residence ?  ?Location of Provider: ?Scientist, research (physical sciences) and [employer] Office  ?  ?Persons participating in Telemedicine visit: ?Geryl Rankins FNP-BC ?Karen Pham  ?  ?History of Present Illness: ?Telemedicine visit for: Medication change ?She has a past medical history of COPD, Adjustment disorder with mixed anxiety and depression, Hypertension, Right carpal tunnel and  ETOH Liver cirrhosis.  ? ? ?Anxiety ?Requesting to switch gabapentin 300 mg daily back to 100 mg TID. She does not feel the 300 mg once a day is helping to relieve her persistent symptoms of anxiety which includes racing thoughts, tremulousness, difficulty staying focused and on task, worrying, and increased mood lability  ? ? ?Past Medical History:  ?Diagnosis Date  ? COPD (chronic obstructive pulmonary disease) ([place])   ? Hypertension   ? Liver cirrhosis ([place])   ?  ?Past Surgical History:  ?Procedure Laterality Date  ? CARPAL TUNNEL RELEASE Right 07/16/2021  ? Procedure: RIGHT CARPAL TUNNEL RELEASE;  Surgeon: Sherilyn Cooter, MD;  Location: [facility];  Service: Orthopedics;  Laterality: Right;  regional with monitored anesthesia care  ?  ?No family history on file.  ?Social History  ? ?Socioeconomic History  ? Marital  status: Single  ?  Spouse name: Not on file  ? Number of children: Not on file  ? Years of education: Not on file  ? Highest education level: Not on file  ?Occupational History  ?  Employer: [employer]  ?Tobacco Use  ? Smoking status: Every Day  ?  Packs/day: 0.50  ?  Types: Cigarettes  ? Smokeless tobacco: Never  ?Substance and Sexual Activity  ? Alcohol use: Not Currently  ?  Alcohol/week: 86.0 standard drinks  ?  Types: 86 Cans of beer per week  ? Drug use: Not Currently  ?  Frequency: 1.0 times per week  ?  Types: "Crack" cocaine, Cocaine, Other-see comments, Marijuana  ?  Comment: 90 days sober [facility]  ? Sexual activity: Not Currently  ?Other Topics Concern  ? Not on file  ?Social History Narrative  ? Right handed   ? Some caffeine  ? ?Social Determinants of Health  ? ?Financial Resource Strain: Medium Risk  ? Difficulty of Paying Living Expenses: Somewhat hard  ?Food Insecurity: Food Insecurity Present  ? Worried About Charity fundraiser in the Last Year: Sometimes true  ? Ran Out of Food in the Last Year: Sometimes true  ?Transportation Needs: No Transportation Needs  ? Lack of Transportation (Medical): No  ? Lack of Transportation (Non-Medical): No  ?Physical Activity: Sufficiently Active  ? Days of Exercise per Week: 5 days  ? Minutes of Exercise per Session: 60 min  ?Stress: Stress Concern Present  ? Feeling of Stress : Very much  ?Social Connections: Socially Isolated  ? Frequency of Communication  with Friends and Family: Three times a week  ? Frequency of Social Gatherings with Friends and Family: Once a week  ? Attends Religious Services: Never  ? Active Member of Clubs or Organizations: No  ? Attends Archivist Meetings: Never  ? Marital Status: Never married  ?  ? ?Observations/Objective: ?Awake, alert and oriented x 3 ? ? ?Review of Systems  ?Constitutional:  Negative for fever, malaise/fatigue and weight loss.  ?HENT: Negative.  Negative for nosebleeds.   ?Eyes: Negative.  Negative for  blurred vision, double vision and photophobia.  ?Respiratory: Negative.  Negative for cough and shortness of breath.   ?Cardiovascular: Negative.  Negative for chest pain, palpitations and leg swelling.  ?Gastrointestinal: Negative.  Negative for heartburn, nausea and vomiting.  ?Musculoskeletal: Negative.  Negative for myalgias.  ?Neurological: Negative.  Negative for dizziness, focal weakness, seizures and headaches.  ?Psychiatric/Behavioral:  Positive for depression. Negative for suicidal ideas. The patient is nervous/anxious and has insomnia.    ?Assessment and Plan: ?Diagnoses and all orders for this visit: ? ?Anxiety and depression ?-     gabapentin (NEURONTIN) 100 MG capsule; Take 1 capsule (100 mg total) by mouth 3 (three) times daily. ? ?  ? ?Follow Up Instructions ?Return if symptoms worsen or fail to improve.  ? ?  ?I discussed the assessment and treatment plan with the patient. The patient was provided an opportunity to ask questions and all were answered. The patient agreed with the plan and demonstrated an understanding of the instructions. ?  ?The patient was advised to call back or seek an in-person evaluation if the symptoms worsen or if the condition fails to improve as anticipated. ? ?I provided 13 minutes of non-face-to-face time during this encounter including median intraservice time, reviewing previous notes, labs, imaging, medications and explaining diagnosis and management. ? ?Gildardo Pounds, FNP-BC  ?

## 2021-07-26 ENCOUNTER — Encounter (HOSPITAL_COMMUNITY): Payer: 59 | Admitting: Psychiatry

## 2021-07-26 ENCOUNTER — Ambulatory Visit (INDEPENDENT_AMBULATORY_CARE_PROVIDER_SITE_OTHER): Payer: Managed Care, Other (non HMO) | Admitting: Orthopedic Surgery

## 2021-07-26 ENCOUNTER — Encounter: Payer: Self-pay | Admitting: Orthopedic Surgery

## 2021-07-26 ENCOUNTER — Other Ambulatory Visit: Payer: Self-pay

## 2021-07-26 DIAGNOSIS — G5601 Carpal tunnel syndrome, right upper limb: Secondary | ICD-10-CM

## 2021-07-26 NOTE — Progress Notes (Signed)
? ?  Post-Op Visit Note ?  ?Patient: Karen Pham           ?Date of Birth: 1976/02/27           ?MRN: [id number] ?Visit Date: 07/26/2021 ?PCP: Elsie Stain, MD ? ? ?Assessment & Plan: ? ?Chief Complaint:  ?Chief Complaint  ?Patient presents with  ? Right Hand - Routine Post Op  ? ?Visit Diagnoses:  ?1. Carpal tunnel syndrome, right upper limb   ? ? ?Plan: Patient is 10 days s/p R CTR.  She is doing very well.  Her nocturnal symptoms have resolved.  She has minimal day time symptoms.  Her incision is healing well without surrounding erythema or induration.  Sutures removed.  Discussed scar massage.  She can see me back as needed.  ? ?Follow-Up Instructions: No follow-ups on file.  ? ?Orders:  ?No orders of the defined types were placed in this encounter. ? ?No orders of the defined types were placed in this encounter. ? ? ?Imaging: ?No results found. ? ?PMFS History: ?Patient Active Problem List  ? Diagnosis Date Noted  ? Carpal tunnel syndrome, right upper limb 07/09/2021  ? Positive FIT (fecal immunochemical test) 05/30/2021  ? Periodontal disease 05/24/2021  ? Need for hepatitis C screening test 05/23/2021  ? Encounter for screening for HIV 05/23/2021  ? Colon cancer screening 05/23/2021  ? Adjustment disorder with mixed anxiety and depressed mood 05/15/2021  ? Depression with anxiety 05/15/2021  ? Paresthesia 04/04/2021  ? Hypertension 04/04/2021  ? Substance abuse ([place]) 04/04/2021  ? Hyperlipidemia 04/04/2021  ? Positive depression screening 04/04/2021  ? Anemia 04/04/2021  ? COPD with chronic bronchitis ([place]) 01/29/2021  ? Breast mass in female 02/10/2019  ? Oral candida 02/10/2019  ? Moderate persistent asthma without complication 43/32/9518  ? Tobacco use 01/28/2018  ? Severe cannabis use disorder ([place]) 11/14/2017  ? Alcohol use disorder, severe, dependence ([place]) 11/13/2017  ? ?Past Medical History:  ?Diagnosis Date  ? COPD (chronic obstructive pulmonary disease) ([place])   ? Hypertension   ? Liver cirrhosis  ([place])   ?  ?History reviewed. No pertinent family history.  ?Past Surgical History:  ?Procedure Laterality Date  ? CARPAL TUNNEL RELEASE Right 07/16/2021  ? Procedure: RIGHT CARPAL TUNNEL RELEASE;  Surgeon: Sherilyn Cooter, MD;  Location: [facility];  Service: Orthopedics;  Laterality: Right;  regional with monitored anesthesia care  ? ?Social History  ? ?Occupational History  ?  Employer: [employer]  ?Tobacco Use  ? Smoking status: Every Day  ?  Packs/day: 0.50  ?  Types: Cigarettes  ? Smokeless tobacco: Never  ?Substance and Sexual Activity  ? Alcohol use: Not Currently  ?  Alcohol/week: 86.0 standard drinks  ?  Types: 86 Cans of beer per week  ? Drug use: Not Currently  ?  Frequency: 1.0 times per week  ?  Types: "Crack" cocaine, Cocaine, Other-see comments, Marijuana  ?  Comment: 90 days sober [facility]  ? Sexual activity: Not Currently  ? ? ? ?

## 2021-08-02 ENCOUNTER — Encounter: Payer: Managed Care, Other (non HMO) | Admitting: Diagnostic Neuroimaging

## 2021-08-06 ENCOUNTER — Other Ambulatory Visit (HOSPITAL_COMMUNITY): Payer: Self-pay | Admitting: Psychiatry

## 2021-08-06 ENCOUNTER — Other Ambulatory Visit: Payer: Self-pay

## 2021-08-06 DIAGNOSIS — F418 Other specified anxiety disorders: Secondary | ICD-10-CM

## 2021-08-07 ENCOUNTER — Other Ambulatory Visit: Payer: Self-pay

## 2021-08-08 ENCOUNTER — Telehealth (HOSPITAL_COMMUNITY): Payer: 59 | Admitting: Psychiatry

## 2021-08-08 ENCOUNTER — Encounter (HOSPITAL_COMMUNITY): Payer: Self-pay

## 2021-08-15 ENCOUNTER — Other Ambulatory Visit: Payer: Self-pay

## 2021-08-16 ENCOUNTER — Other Ambulatory Visit: Payer: Self-pay

## 2021-08-21 ENCOUNTER — Ambulatory Visit: Payer: Managed Care, Other (non HMO) | Attending: Critical Care Medicine | Admitting: Critical Care Medicine

## 2021-08-21 ENCOUNTER — Encounter: Payer: Self-pay | Admitting: Critical Care Medicine

## 2021-08-21 VITALS — BP 123/85 | HR 74 | Wt 135.8 lb

## 2021-08-21 DIAGNOSIS — N63 Unspecified lump in unspecified breast: Secondary | ICD-10-CM

## 2021-08-21 DIAGNOSIS — F419 Anxiety disorder, unspecified: Secondary | ICD-10-CM

## 2021-08-21 DIAGNOSIS — Z1231 Encounter for screening mammogram for malignant neoplasm of breast: Secondary | ICD-10-CM

## 2021-08-21 DIAGNOSIS — E785 Hyperlipidemia, unspecified: Secondary | ICD-10-CM | POA: Diagnosis not present

## 2021-08-21 DIAGNOSIS — J9801 Acute bronchospasm: Secondary | ICD-10-CM

## 2021-08-21 DIAGNOSIS — F32A Depression, unspecified: Secondary | ICD-10-CM | POA: Diagnosis not present

## 2021-08-21 DIAGNOSIS — I10 Essential (primary) hypertension: Secondary | ICD-10-CM | POA: Diagnosis not present

## 2021-08-21 DIAGNOSIS — R195 Other fecal abnormalities: Secondary | ICD-10-CM

## 2021-08-21 DIAGNOSIS — F17210 Nicotine dependence, cigarettes, uncomplicated: Secondary | ICD-10-CM

## 2021-08-21 DIAGNOSIS — F418 Other specified anxiety disorders: Secondary | ICD-10-CM

## 2021-08-21 DIAGNOSIS — Z1152 Encounter for screening for COVID-19: Secondary | ICD-10-CM

## 2021-08-21 DIAGNOSIS — F1020 Alcohol dependence, uncomplicated: Secondary | ICD-10-CM

## 2021-08-21 DIAGNOSIS — Z1211 Encounter for screening for malignant neoplasm of colon: Secondary | ICD-10-CM

## 2021-08-21 DIAGNOSIS — J4540 Moderate persistent asthma, uncomplicated: Secondary | ICD-10-CM

## 2021-08-21 DIAGNOSIS — Z720 Tobacco use: Secondary | ICD-10-CM

## 2021-08-21 MED ORDER — BENZONATATE 100 MG PO CAPS: 100.0000 mg | ORAL_CAPSULE | Freq: Two times a day (BID) | ORAL | 0 refills | Status: DC | PRN

## 2021-08-21 MED ORDER — FLUTICASONE PROPIONATE HFA 44 MCG/ACT IN AERO: 2.0000 | INHALATION_SPRAY | Freq: Two times a day (BID) | RESPIRATORY_TRACT | 3 refills | Status: DC

## 2021-08-21 MED ORDER — GABAPENTIN 100 MG PO CAPS: 100.0000 mg | ORAL_CAPSULE | Freq: Three times a day (TID) | ORAL | 4 refills | Status: DC

## 2021-08-21 MED ORDER — AMLODIPINE BESYLATE 10 MG PO TABS: 10.0000 mg | ORAL_TABLET | Freq: Every day | ORAL | 1 refills | Status: DC

## 2021-08-21 MED ORDER — VALSARTAN 160 MG PO TABS
160.0000 mg | ORAL_TABLET | Freq: Every day | ORAL | 3 refills | Status: DC
Start: 2021-08-21 — End: 2021-09-13

## 2021-08-21 MED ORDER — ATORVASTATIN CALCIUM 40 MG PO TABS: 40.0000 mg | ORAL_TABLET | Freq: Every day | ORAL | 1 refills | Status: DC

## 2021-08-21 MED ORDER — SERTRALINE HCL 50 MG PO TABS
50.0000 mg | ORAL_TABLET | Freq: Every day | ORAL | 3 refills | Status: DC
  Filled 2021-10-05: qty 30, 30d supply, fill #0
  Filled 2021-11-01: qty 30, 30d supply, fill #1

## 2021-08-21 NOTE — Assessment & Plan Note (Addendum)
Patient having mild exacerbation I am concerned about the possibility of COVID she will be screened for COVID and given a work note to stay out of work because she works in [employer] until the [employer] test returns ? ?Benzonatate was prescribed for cough ?

## 2021-08-21 NOTE — Assessment & Plan Note (Signed)
Patient has existing appointment with gastroenterology for colonoscopy urged to follow-up on this ?

## 2021-08-21 NOTE — Assessment & Plan Note (Signed)
As per positive fit test ?

## 2021-08-21 NOTE — Patient Instructions (Addendum)
Mammogram will be ordered for screening ? ?COVID test will be obtained we will call you results, letter to have the stay off work until the [employer] results return will be issued ? ?Try to reduce the amount of tobacco use you are currently using ? ?No medication changes refills were sent to your pharmacy ? ?Stay on your inhalers for your breathing ? ?A cough medication was sent to the pharmacy ? ?You have had a referral sent to gastroenterology for an appointment for colonoscopy, I have tried to contact you please call them back your number is [phone] get the colonoscopy scheduled ? ?Return to see Dr. Joya Gaskins 4 months ? ?A Pap smear with one of our providers in this clinic will be scheduled ?

## 2021-08-21 NOTE — Assessment & Plan Note (Signed)
We will screen for COVID though highly doubt that she has COVID ?

## 2021-08-21 NOTE — Assessment & Plan Note (Signed)
Hypertension well controlled ? ?Continue current medications no changes ?

## 2021-08-21 NOTE — Assessment & Plan Note (Signed)
Plan to repeat mammogram ?

## 2021-08-21 NOTE — Assessment & Plan Note (Signed)
Patient is now in remission ?

## 2021-08-21 NOTE — Assessment & Plan Note (Signed)
Continue working on smoking reduction and cessation as discussed. ? ? ? ?? Current smoking consumption amount: 1/2 PPD ? ?? Dicsussion on advise to quit smoking and smoking impacts: CV lung impacts ? ?? Patient's willingness to quit:  In sober living for subst use/ etoh will be a challenge ? ?? Methods to quit smoking discussed:  behav mod ? ?? Medication management of smoking session drugs discussed:none ? ?? Resources provided:  AVS  ? ?? Setting quit date not established ? ?? Follow-up arranged 3 months ? ? ?Time spent counseling the patient:  5 min ? ?

## 2021-08-21 NOTE — Progress Notes (Signed)
? ?Established Patient Office Visit ? ?Subjective:  ?Patient ID: Karen Pham, female    DOB: 01-05-1976  Age: 46 y.o. MRN: [id number] ? ?CC:  ?Chief Complaint  ?Patient presents with  ? Medication Refill  ? ? ?HPI ?1/18 ?Karen Pham presents for primary care follow up. She saw Freeman Caldron, PA-C at this practice on 04/04/2021 to establish care. Her medications were refilled at that time, and labs were ordered for thyroid and magnesium levels, both of which were normal. She has had ongoing numbness and weakness in the hands and feet. She was referred to orthopedics. She attempted to go to that appointment, but her insurance is not active yet and she was stuck with a high copay that she was unable to pay. She states she has signed up for health insurance through [facility] and this is set to begin on Feb 1. She has rescheduled her appointment with orthopedics to February and she is hoping she will be able to go then. ? ?The patient's main concerns are regarding her leg/foot pain and numbness in her right hand and wrist. She said the foot pain has been going on for awhile but is getting worse, but the numbness in the right hand and wrist is new. She gets foot swelling and pain especially after work. She works at [employer] and is on her feet all day. The pain in her feet causes her to limp by the end of the day. She has a feeling of pins and needles in her right hand and wrist that will wake her up from sleep. She says this is making it difficult for her to hold objects which is frustrating for her. She tries moving her hands, stretching them in and out to try to get feeling back. This helps somewhat. ? ?She also has a recurring "sore" on her upper gumline. She says that it will periodically swell, fill with pus, and then burst. She said it is not painful today. She says that she has gotten used to having a lot of mouth pain and only really notices when it is really bad. She would like to go to a dentist and tried  going to the [facility] clinic, but was told that they could not help her.  ? ?She is currently taking Lisinopril-HCTZ and amlodipine for her hypertension. She says she does not take the Lisinopril-HCTZ on days where she works because she says she does not like that it makes her urinate so frequently. Her BP remains slightly elevated today at 147/88.   ? ?She is currently living in sober living housing. She says her housing is safe and secure for now, but she would eventually like her own place. 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 She is not using any other illicit substances right now. She is still smoking, but she has decreased her smoking from just over 1 PPD to 1/2 PPD. She says she thinks this has helped her breathing. She also uses prescribed inhalers. She says her mood is stable right now, and she follows with [name], NP for behavioral health ? ?She is due for routine health screenings. She states she has had a mammogram and pap smear in the past, but does not remember when or if there were any significant results. She also needs Hepatitis C and HIV screenings, and she plans on getting these done at the lab here today. She thinks she has been screened for these before  but does not remember when. She needs colon cancer screening, and is willing to do the FOBT kit. She is also interested in getting the tDAP vaccine today. ? ?08/21/2021 ?Patient seen in short-term follow-up from January visit still smoking half pack a day of cigarettes and has a congested cough.  She had a roommate who had a recent febrile illness who is being evaluated for COVID.  Patient was exposed to this individual.  She does work in Nurse, learning disability for [employer].  She is due a colonoscopy and a Pap smear and mammogram.  Colonoscopy has already been referred and they have been trying to reach the patient we will give her the contact information for this.  Patient is accompanied by her  close friend [name] who is her primary healthcare advocate. ? ?On arrival blood pressure is excellent 123/85.  Patient has no other complaints.  The patient remains sober at this time.  She had a carpal tunnel surgery done on the right hand recently and has improved with this. ?This patient also has a follow-up visit scheduled with dental ?Past Medical History:  ?Diagnosis Date  ? COPD (chronic obstructive pulmonary disease) ([place])   ? Hypertension   ? Liver cirrhosis ([place])   ? ? ?Past Surgical History:  ?Procedure Laterality Date  ? CARPAL TUNNEL RELEASE Right 07/16/2021  ? Procedure: RIGHT CARPAL TUNNEL RELEASE;  Surgeon: Sherilyn Cooter, MD;  Location: [facility];  Service: Orthopedics;  Laterality: Right;  regional with monitored anesthesia care  ? ? ?History reviewed. No pertinent family history. ? ?Social History  ? ?Socioeconomic History  ? Marital status: Single  ?  Spouse name: Not on file  ? Number of children: Not on file  ? Years of education: Not on file  ? Highest education level: Not on file  ?Occupational History  ?  Employer: [employer]  ?Tobacco Use  ? Smoking status: Every Day  ?  Packs/day: 0.50  ?  Types: Cigarettes  ? Smokeless tobacco: Never  ?Substance and Sexual Activity  ? Alcohol use: Not Currently  ?  Alcohol/week: 86.0 standard drinks  ?  Types: 86 Cans of beer per week  ? Drug use: Not Currently  ?  Frequency: 1.0 times per week  ?  Types: "Crack" cocaine, Cocaine, Other-see comments, Marijuana  ?  Comment: 90 days sober [facility]  ? Sexual activity: Not Currently  ?Other Topics Concern  ? Not on file  ?Social History Narrative  ? Right handed   ? Some caffeine  ? ?Social Determinants of Health  ? ?Financial Resource Strain: Medium Risk  ? Difficulty of Paying Living Expenses: Somewhat hard  ?Food Insecurity: Food Insecurity Present  ? Worried About Charity fundraiser in the Last Year: Sometimes true  ? Ran Out of Food in the Last Year: Sometimes true  ?Transportation  Needs: No Transportation Needs  ? Lack of Transportation (Medical): No  ? Lack of Transportation (Non-Medical): No  ?Physical Activity: Sufficiently Active  ? Days of Exercise per Week: 5 days  ? Minutes of Exercise per Session: 60 min  ?Stress: Stress Concern Present  ? Feeling of Stress : Very much  ?Social Connections: Socially Isolated  ? Frequency of Communication with Friends and Family: Three times a week  ? Frequency of Social Gatherings with Friends and Family: Once a week  ? Attends Religious Services: Never  ? Active Member of Clubs or Organizations: No  ? Attends Archivist Meetings: Never  ? Marital  Status: Never married  ?Intimate Partner Violence: Not At Risk  ? Fear of Current or Ex-Partner: No  ? Emotionally Abused: No  ? Physically Abused: No  ? Sexually Abused: No  ? ? ?Outpatient Medications Prior to Visit  ?Medication Sig Dispense Refill  ? albuterol (VENTOLIN HFA) 108 (90 Base) MCG/ACT inhaler Inhale 2 puffs into the lungs 3 (three) times daily as needed. 18 g 2  ? chlorhexidine (PERIDEX) 0.12 % solution Use as directed 15 mLs in the mouth or throat 2 (two) times daily. 473 mL 0  ? hydrOXYzine (ATARAX) 25 MG tablet Take 1 tablet (25 mg total) by mouth 3 (three) times daily as needed. 30 tablet 3  ? ibuprofen (ADVIL) 400 MG tablet Take 400 mg by mouth every 6 (six) hours as needed.    ? QUEtiapine (SEROQUEL) 50 MG tablet Take 1 tablet (50 mg total) by mouth at bedtime. 30 tablet 3  ? amLODipine (NORVASC) 10 MG tablet Take 1 tablet (10 mg total) by mouth daily. 90 tablet 1  ? atorvastatin (LIPITOR) 40 MG tablet Take 1 tablet (40 mg total) by mouth daily. 90 tablet 1  ? fluticasone (FLOVENT HFA) 44 MCG/ACT inhaler Inhale 2 puffs into the lungs in the morning and at bedtime. 10.6 g 3  ? gabapentin (NEURONTIN) 100 MG capsule Take 1 capsule (100 mg total) by mouth 3 (three) times daily. 90 capsule 0  ? sertraline (ZOLOFT) 50 MG tablet Take 1 tablet (50 mg total) by mouth daily. 30 tablet 3   ? valsartan (DIOVAN) 160 MG tablet Take 1 tablet (160 mg total) by mouth daily. 90 tablet 3  ? acetaminophen (TYLENOL) 325 MG tablet Take 650 mg by mouth every 6 (six) hours as needed. (Patient not taking: Rep

## 2021-08-22 ENCOUNTER — Encounter: Payer: Self-pay | Admitting: Internal Medicine

## 2021-08-22 LAB — NOVEL CORONAVIRUS, NAA: SARS-CoV-2, NAA: NOT DETECTED

## 2021-08-23 ENCOUNTER — Telehealth: Payer: Self-pay

## 2021-08-23 NOTE — Telephone Encounter (Signed)
-----   Message from Elsie Stain, MD sent at 08/22/2021  8:10 PM EDT ----- ?Call pt and tell her covid is negative ?

## 2021-08-23 NOTE — Telephone Encounter (Signed)
Pt was called and vm was left, Information has been sent to nurse pool.   

## 2021-08-31 ENCOUNTER — Other Ambulatory Visit: Payer: Self-pay

## 2021-09-11 ENCOUNTER — Ambulatory Visit (HOSPITAL_COMMUNITY): Payer: Commercial Managed Care - HMO | Admitting: Licensed Clinical Social Worker

## 2021-09-13 ENCOUNTER — Other Ambulatory Visit: Payer: Self-pay

## 2021-09-13 ENCOUNTER — Encounter: Payer: Self-pay | Admitting: Physician Assistant

## 2021-09-13 ENCOUNTER — Other Ambulatory Visit (HOSPITAL_COMMUNITY)
Admission: RE | Admit: 2021-09-13 | Discharge: 2021-09-13 | Disposition: A | Discharge status: Home / Self Care | Payer: Commercial Managed Care - HMO | Source: Ambulatory Visit | Attending: Physician Assistant | Admitting: Physician Assistant

## 2021-09-13 ENCOUNTER — Ambulatory Visit: Payer: Commercial Managed Care - HMO | Attending: Physician Assistant | Admitting: Physician Assistant

## 2021-09-13 ENCOUNTER — Other Ambulatory Visit: Payer: Self-pay | Admitting: Critical Care Medicine

## 2021-09-13 VITALS — BP 123/76 | Temp 97.8°F | Resp 18 | Ht 65.0 in | Wt 134.0 lb

## 2021-09-13 DIAGNOSIS — E785 Hyperlipidemia, unspecified: Secondary | ICD-10-CM

## 2021-09-13 DIAGNOSIS — Z124 Encounter for screening for malignant neoplasm of cervix: Secondary | ICD-10-CM

## 2021-09-13 MED ORDER — VALSARTAN 160 MG PO TABS
160.0000 mg | ORAL_TABLET | Freq: Every day | ORAL | 3 refills | Status: DC
  Filled 2021-09-13: qty 30, 30d supply, fill #0
  Filled 2021-11-27: qty 30, 30d supply, fill #1

## 2021-09-13 MED ORDER — ATORVASTATIN CALCIUM 40 MG PO TABS
40.0000 mg | ORAL_TABLET | Freq: Every day | ORAL | 1 refills | Status: DC
  Filled 2021-09-13: qty 30, 30d supply, fill #0
  Filled 2021-11-01: qty 30, 30d supply, fill #1
  Filled 2021-11-27: qty 30, 30d supply, fill #2

## 2021-09-13 NOTE — Patient Instructions (Signed)
Pap Test Why am I having this test? A Pap test, also called a Pap smear, is a screening test to check for signs of: Infection. Cancer of the cervix. The cervix is the lower part of the uterus that opens into the vagina. Changes that may be a sign that cancer is developing (precancerous changes). Women need this test on a regular basis. In general, you should have a Pap test every 3 years until you reach menopause or age 47. Women aged 30-60 may choose to have their Pap test done at the same time as an HPV (human papillomavirus) test every 5 years (instead of every 3 years). Your health care provider may recommend having Pap tests more or less often depending on your medical conditions and past Pap test results. What is being tested? Cervical cells are tested for signs of infection or abnormalities. What kind of sample is taken?  Your health care provider will collect a sample of cells from the surface of your cervix. This will be done using a small cotton swab, plastic spatula, or brush that is inserted into your vagina using a tool called a speculum. This sample is often collected during a pelvic exam, when you are lying on your back on an exam table with your feet in footrests (stirrups). In some cases, fluids (secretions) from the cervix or vagina may also be collected. How do I prepare for this test? Be aware of where you are in your menstrual cycle. If you are menstruating on the day of the test, you may be asked to reschedule. You may need to reschedule if you have a known vaginal infection on the day of the test. Follow instructions from your health care provider about: Changing or stopping your regular medicines. Some medicines can cause abnormal test results, such as vaginal medicines and tetracycline. Avoiding douching 2-3 days before or the day of the test. Tell a health care provider about: Any allergies you have. All medicines you are taking, including vitamins, herbs, eye drops,  creams, and over-the-counter medicines. Any bleeding problems you have. Any surgeries you have had. Any medical conditions you have. Whether you are pregnant or may be pregnant. How are the results reported? Your test results will be reported as either abnormal or normal. What do the results mean? A normal test result means that you do not have signs of cancer of the cervix. An abnormal result may mean that you have: Cancer. A Pap test by itself is not enough to diagnose cancer. You will have more tests done if cancer is suspected. Precancerous changes in your cervix. Inflammation of the cervix. An STI (sexually transmitted infection). A fungal infection. A parasite infection. Talk with your health care provider about what your results mean. In some cases, your health care provider may do more testing to confirm the results. Questions to ask your health care provider Ask your health care provider, or the department that is doing the test: When will my results be ready? How will I get my results? What are my treatment options? What other tests do I need? What are my next steps? Summary In general, women should have a Pap test every 3 years until they reach menopause or age 47. Your health care provider will collect a sample of cells from the surface of your cervix. This will be done using a small cotton swab, plastic spatula, or brush. In some cases, fluids (secretions) from the cervix or vagina may also be collected. This information is not   intended to replace advice given to you by your health care provider. Make sure you discuss any questions you have with your health care provider. Document Revised: 07/21/2020 Document Reviewed: 07/21/2020 Elsevier Patient Education  2023 Elsevier Inc.  

## 2021-09-13 NOTE — Progress Notes (Signed)
? ?Karen Pham, is a 46 y.o. female ? ?TTS:[id number] ? ?SPQ:[id number] ? ?DOB - 11-09-1975 ? ?No chief complaint on file. ?    ? ?Subjective:  ? ?Karen Pham is a 46 y.o. female here today for a pap smear.  Mmg scheduled for next week.  No vaginal discharge/odor.  No h/o abnormal pap.  Periods slightly irregular but no night sweats/hot flashes/perimenopausal type symptoms ? ?No problems updated. ? ?ALLERGIES: ?Allergies  ?Allergen Reactions  ? Latex Hives  ? Sulfa Antibiotics Hives  ? ? ?PAST MEDICAL HISTORY: ?Past Medical History:  ?Diagnosis Date  ? COPD (chronic obstructive pulmonary disease) ([place])   ? Hypertension   ? Liver cirrhosis ([place])   ? ? ?MEDICATIONS AT HOME: ?Prior to Admission medications   ?Medication Sig Start Date End Date Taking? Authorizing Provider  ?acetaminophen (TYLENOL) 325 MG tablet Take 650 mg by mouth every 6 (six) hours as needed. ?Patient not taking: Reported on 08/21/2021    [provider]  ?albuterol (VENTOLIN HFA) 108 (90 Base) MCG/ACT inhaler Inhale 2 puffs into the lungs 3 (three) times daily as needed. 05/23/21   Elsie Stain, MD  ?amLODipine (NORVASC) 10 MG tablet Take 1 tablet (10 mg total) by mouth daily. 08/21/21   Elsie Stain, MD  ?aspirin 81 MG chewable tablet Chew 1 tablet (81 mg total) by mouth daily. ?Patient not taking: Reported on 08/21/2021 04/04/21   Argentina Donovan, PA-C  ?atorvastatin (LIPITOR) 40 MG tablet Take 1 tablet (40 mg total) by mouth daily. 09/13/21   Elsie Stain, MD  ?benzonatate (TESSALON) 100 MG capsule Take 1 capsule (100 mg total) by mouth 2 (two) times daily as needed for cough. 08/21/21   Elsie Stain, MD  ?chlorhexidine (PERIDEX) 0.12 % solution Use as directed 15 mLs in the mouth or throat 2 (two) times daily. 05/24/21   Elsie Stain, MD  ?fluticasone (FLOVENT HFA) 44 MCG/ACT inhaler Inhale 2 puffs into the lungs in the morning and at bedtime. 08/21/21   Elsie Stain, MD  ?gabapentin (NEURONTIN) 100 MG capsule  Take 1 capsule (100 mg total) by mouth 3 (three) times daily. 08/21/21   Elsie Stain, MD  ?hydrOXYzine (ATARAX) 25 MG tablet Take 1 tablet (25 mg total) by mouth 3 (three) times daily as needed. 05/15/21   Salley Slaughter, NP  ?ibuprofen (ADVIL) 400 MG tablet Take 400 mg by mouth every 6 (six) hours as needed.    [provider]  ?QUEtiapine (SEROQUEL) 50 MG tablet Take 1 tablet (50 mg total) by mouth at bedtime. 05/15/21   Salley Slaughter, NP  ?sertraline (ZOLOFT) 50 MG tablet Take 1 tablet (50 mg total) by mouth daily. 08/21/21   Elsie Stain, MD  ?valsartan (DIOVAN) 160 MG tablet Take 1 tablet (160 mg total) by mouth daily. 09/13/21   Elsie Stain, MD  ? ? ?ROS: ?Neg HEENT ?Neg resp ?Neg cardiac ?Neg GI ?Neg GU ?Neg MS ?Neg psych ?Neg neuro ? ?Objective:  ? ?Vitals:  ? 09/13/21 1105  ?BP: 123/76  ?Resp: 18  ?Temp: 97.8 ?F (36.6 ?C)  ?TempSrc: Oral  ?SpO2: 100%  ?Weight: 134 lb (60.8 kg)  ?Height: '5\' 5"'$  (1.651 m)  ? ?Exam ?General appearance : Awake, alert, not in any distress. Speech Clear. ?Extremities: B/L Lower Ext shows no edema, both legs are warm to touch ?Neurology: Awake alert, and oriented X 3, CN II-XII intact, Non focal ?Resp :NAD ?GU:  external genitalia ANL, nulliparous  cervix.  Pap taken.  Bimanual unremarkable.   ?Skin: No Rash ? ?Data Review ?No results found for: HGBA1C ? ?Assessment & Plan  ? ?1. Encounter for Papanicolaou smear of cervix ?Pap only ?- Cytology - PAP([place]) ? ? ? ?Return for keep appt with Dr Joya Gaskins in August. ? ?The patient was given clear instructions to go to ER or return to medical center if symptoms don't improve, worsen or new problems develop. The patient verbalized understanding. The patient was told to call to get lab results if they haven't heard anything in the next week.  ? ? ? ? ?Freeman Caldron, PA-C ?[facility] ?[place], [place] ?[phone]   ?09/13/2021, 12:50 PM Patient ID: Karen Pham, female    DOB: 09/23/1975, 46 y.o.   MRN: [id number] ? ?

## 2021-09-13 NOTE — Progress Notes (Signed)
Patient has eaten today. Patient has not taken medication today. ?Patient denies pain at this time. ? ?

## 2021-09-14 ENCOUNTER — Ambulatory Visit (AMBULATORY_SURGERY_CENTER): Payer: Self-pay | Admitting: *Deleted

## 2021-09-14 VITALS — Ht 66.0 in | Wt 134.0 lb

## 2021-09-14 DIAGNOSIS — R195 Other fecal abnormalities: Secondary | ICD-10-CM

## 2021-09-14 NOTE — Progress Notes (Signed)
Patient's pre-visit was done today over the phone with the patient. Name,DOB and address verified. Patient denies any allergies to Eggs and Soy. Patient denies any problems with anesthesia/sedation. Patient is not taking any diet pills or blood thinners. No home Oxygen. Insurance confirmed with patient. ? ?Prep instructions sent to pt's MyChart-pt is aware. Patient understands to call us back with any questions or concerns. Patient is aware of our care-partner policy.  ? ?EMMI education assigned to the patient for the procedure, sent to MyChart.  ? ?The patient is COVID-19 vaccinated.   ?

## 2021-09-18 ENCOUNTER — Other Ambulatory Visit: Payer: Self-pay

## 2021-09-19 ENCOUNTER — Ambulatory Visit: Payer: Managed Care, Other (non HMO)

## 2021-09-20 ENCOUNTER — Other Ambulatory Visit: Payer: Self-pay

## 2021-09-20 LAB — CYTOLOGY - PAP
Chlamydia: NEGATIVE
Comment: NEGATIVE
Comment: NEGATIVE
Comment: NEGATIVE
Comment: NEGATIVE
Comment: NORMAL
Diagnosis: NEGATIVE
HSV1: NEGATIVE
HSV2: NEGATIVE
High risk HPV: NEGATIVE
Neisseria Gonorrhea: NEGATIVE
Trichomonas: NEGATIVE

## 2021-09-24 ENCOUNTER — Other Ambulatory Visit: Payer: Self-pay

## 2021-09-28 ENCOUNTER — Ambulatory Visit
Admission: RE | Admit: 2021-09-28 | Discharge: 2021-09-28 | Disposition: A | Discharge status: Home / Self Care | Payer: Medicaid Other | Source: Ambulatory Visit | Attending: Critical Care Medicine | Admitting: Critical Care Medicine

## 2021-09-28 DIAGNOSIS — Z1231 Encounter for screening mammogram for malignant neoplasm of breast: Secondary | ICD-10-CM

## 2021-10-02 ENCOUNTER — Encounter (HOSPITAL_COMMUNITY): Payer: Self-pay

## 2021-10-02 ENCOUNTER — Ambulatory Visit (HOSPITAL_COMMUNITY): Payer: Commercial Managed Care - HMO | Admitting: Licensed Clinical Social Worker

## 2021-10-03 ENCOUNTER — Encounter: Payer: Self-pay | Admitting: Internal Medicine

## 2021-10-04 ENCOUNTER — Encounter: Payer: Self-pay | Admitting: Internal Medicine

## 2021-10-04 ENCOUNTER — Ambulatory Visit (AMBULATORY_SURGERY_CENTER): Payer: Managed Care, Other (non HMO) | Admitting: Internal Medicine

## 2021-10-04 VITALS — BP 138/81 | HR 62 | Temp 98.6°F | Resp 18 | Ht 66.0 in | Wt 134.0 lb

## 2021-10-04 DIAGNOSIS — D124 Benign neoplasm of descending colon: Secondary | ICD-10-CM

## 2021-10-04 DIAGNOSIS — D122 Benign neoplasm of ascending colon: Secondary | ICD-10-CM

## 2021-10-04 DIAGNOSIS — D125 Benign neoplasm of sigmoid colon: Secondary | ICD-10-CM

## 2021-10-04 DIAGNOSIS — Z1211 Encounter for screening for malignant neoplasm of colon: Secondary | ICD-10-CM

## 2021-10-04 DIAGNOSIS — R195 Other fecal abnormalities: Secondary | ICD-10-CM

## 2021-10-04 DIAGNOSIS — D128 Benign neoplasm of rectum: Secondary | ICD-10-CM

## 2021-10-04 MED ORDER — SODIUM CHLORIDE 0.9 % IV SOLN: 500.0000 mL | INTRAVENOUS | Status: DC

## 2021-10-04 NOTE — Progress Notes (Signed)
GASTROENTEROLOGY PROCEDURE H&P NOTE   Primary Care Physician: Elsie Stain, MD    Reason for Procedure:   Colon cancer screening  Plan:    Colonoscopy  Patient is appropriate for endoscopic procedure(s) in the ambulatory ([facility]) setting.  The nature of the procedure, as well as the risks, benefits, and alternatives were carefully and thoroughly reviewed with the patient. Ample time for discussion and questions allowed. The patient understood, was satisfied, and agreed to proceed.     HPI: Karen Pham is a 46 y.o. female who presents for colonoscopy for colon cancer screening. Denies blood in stools, changes in bowel habits, weight loss. Denies family history of colon cancer.  Past Medical History:  Diagnosis Date   COPD (chronic obstructive pulmonary disease) ([place])    Hypertension    Liver cirrhosis ([place])     Past Surgical History:  Procedure Laterality Date   CARPAL TUNNEL RELEASE Right 07/16/2021   Procedure: RIGHT CARPAL TUNNEL RELEASE;  Surgeon: Sherilyn Cooter, MD;  Location: [place] [facility];  Service: Orthopedics;  Laterality: Right;  regional with monitored anesthesia care    Prior to Admission medications   Medication Sig Start Date End Date Taking? Authorizing Provider  albuterol (VENTOLIN HFA) 108 (90 Base) MCG/ACT inhaler Inhale 2 puffs into the lungs 3 (three) times daily as needed. 05/23/21  Yes Elsie Stain, MD  amLODipine (NORVASC) 10 MG tablet Take 1 tablet (10 mg total) by mouth daily. 08/21/21  Yes Elsie Stain, MD  atorvastatin (LIPITOR) 40 MG tablet Take 1 tablet (40 mg total) by mouth daily. 09/13/21  Yes Elsie Stain, MD  fluticasone (FLOVENT HFA) 44 MCG/ACT inhaler Inhale 2 puffs into the lungs in the morning and at bedtime. 08/21/21  Yes Elsie Stain, MD  gabapentin (NEURONTIN) 100 MG capsule Take 1 capsule (100 mg total) by mouth 3 (three) times daily. 08/21/21  Yes Elsie Stain, MD  QUEtiapine (SEROQUEL) 50 MG  tablet Take 1 tablet (50 mg total) by mouth at bedtime. 05/15/21  Yes [name] E, NP  sertraline (ZOLOFT) 50 MG tablet Take 1 tablet (50 mg total) by mouth daily. 08/21/21  Yes Elsie Stain, MD  valsartan (DIOVAN) 160 MG tablet Take 1 tablet (160 mg total) by mouth daily. 09/13/21  Yes Elsie Stain, MD  benzonatate (TESSALON) 100 MG capsule Take 1 capsule (100 mg total) by mouth 2 (two) times daily as needed for cough. Patient not taking: Reported on 10/04/2021 08/21/21   Elsie Stain, MD  hydrOXYzine (ATARAX) 25 MG tablet Take 1 tablet (25 mg total) by mouth 3 (three) times daily as needed. 05/15/21   Salley Slaughter, NP    Current Outpatient Medications  Medication Sig Dispense Refill   albuterol (VENTOLIN HFA) 108 (90 Base) MCG/ACT inhaler Inhale 2 puffs into the lungs 3 (three) times daily as needed. 18 g 2   amLODipine (NORVASC) 10 MG tablet Take 1 tablet (10 mg total) by mouth daily. 90 tablet 1   atorvastatin (LIPITOR) 40 MG tablet Take 1 tablet (40 mg total) by mouth daily. 90 tablet 1   fluticasone (FLOVENT HFA) 44 MCG/ACT inhaler Inhale 2 puffs into the lungs in the morning and at bedtime. 10.6 g 3   gabapentin (NEURONTIN) 100 MG capsule Take 1 capsule (100 mg total) by mouth 3 (three) times daily. 90 capsule 4   QUEtiapine (SEROQUEL) 50 MG tablet Take 1 tablet (50 mg total) by mouth at bedtime. 30 tablet 3  sertraline (ZOLOFT) 50 MG tablet Take 1 tablet (50 mg total) by mouth daily. 30 tablet 3   valsartan (DIOVAN) 160 MG tablet Take 1 tablet (160 mg total) by mouth daily. 90 tablet 3   benzonatate (TESSALON) 100 MG capsule Take 1 capsule (100 mg total) by mouth 2 (two) times daily as needed for cough. (Patient not taking: Reported on 10/04/2021) 20 capsule 0   hydrOXYzine (ATARAX) 25 MG tablet Take 1 tablet (25 mg total) by mouth 3 (three) times daily as needed. 30 tablet 3   Current Facility-Administered Medications  Medication Dose Route Frequency Provider Last  Rate Last Admin   0.9 %  sodium chloride infusion  500 mL Intravenous Continuous Sharyn Creamer, MD        Allergies as of 10/04/2021 - Review Complete 10/04/2021  Allergen Reaction Noted   Latex Hives 10/22/2015   Sulfa antibiotics Hives 03/19/2011    Family History  Problem Relation Age of Onset   Colon cancer Neg Hx    Esophageal cancer Neg Hx    Rectal cancer Neg Hx    Stomach cancer Neg Hx     Social History   Socioeconomic History   Marital status: Single    Spouse name: Not on file   Number of children: Not on file   Years of education: Not on file   Highest education level: Not on file  Occupational History    Employer: [employer]  Tobacco Use   Smoking status: Every Day    Packs/day: 0.50    Types: Cigarettes   Smokeless tobacco: Never  Vaping Use   Vaping Use: Never used  Substance and Sexual Activity   Alcohol use: Not Currently   Drug use: Not Currently    Frequency: 1.0 times per week    Types: "Crack" cocaine, Cocaine, Other-see comments, Marijuana    Comment: 7 months clean per pt   Sexual activity: Not Currently  Other Topics Concern   Not on file  Social History Narrative   Right handed    Some caffeine   Social Determinants of Health   Financial Resource Strain: Medium Risk   Difficulty of Paying Living Expenses: Somewhat hard  Food Insecurity: Food Insecurity Present   Worried About Estate manager/land agent of Food in the Last Year: Sometimes true   Ran Out of Food in the Last Year: Sometimes true  Transportation Needs: No Transportation Needs   Lack of Transportation (Medical): No   Lack of Transportation (Non-Medical): No  Physical Activity: Sufficiently Active   Days of Exercise per Week: 5 days   Minutes of Exercise per Session: 60 min  Stress: Stress Concern Present   Feeling of Stress : Very much  Social Connections: Socially Isolated   Frequency of Communication with Friends and Family: Three times a week   Frequency of Social Gatherings  with Friends and Family: Once a week   Attends Religious Services: Never   Marine scientist or Organizations: No   Attends Music therapist: Never   Marital Status: Never married  Human resources officer Violence: Not At Risk   Fear of Current or Ex-Partner: No   Emotionally Abused: No   Physically Abused: No   Sexually Abused: No    Physical Exam: Vital signs in last 24 hours: BP (!) 91/52   Pulse 79   Temp 98.6 F (37 C) (Temporal)   Ht '5\' 6"'$  (1.676 m)   Wt 134 lb (60.8 kg)   LMP 10/03/2021 (Exact  Date)   SpO2 96%   BMI 21.63 kg/m  GEN: NAD EYE: Sclerae anicteric ENT: MMM CV: Non-tachycardic Pulm: No increased work of breathing GI: Soft, NT/ND NEURO:  Alert & Oriented   Christia Reading, MD [facility]  10/04/2021 10:57 AM

## 2021-10-04 NOTE — Progress Notes (Signed)
Called to room to assist during endoscopic procedure.  Patient ID and intended procedure confirmed with present staff. Received instructions for my participation in the procedure from the performing physician.  

## 2021-10-04 NOTE — Op Note (Signed)
[facility] Patient Name: Karen Pham Procedure Date: 10/04/2021 11:03 AM MRN: [id number] Endoscopist: Sonny Masters "Karen Pham ,  Age: 46 Referring MD:  Date of Birth: 12-17-1975 Gender: Female Account #: [id number] Procedure:                Colonoscopy Indications:              Screening for colorectal malignant neoplasm, This                            is the patient's first colonoscopy Medicines:                Monitored Anesthesia Care Procedure:                Pre-Anesthesia Assessment:                           - Prior to the procedure, a History and Physical                            was performed, and patient medications and                            allergies were reviewed. The patient's tolerance of                            previous anesthesia was also reviewed. The risks                            and benefits of the procedure and the sedation                            options and risks were discussed with the patient.                            All questions were answered, and informed consent                            was obtained. Prior Anticoagulants: The patient has                            taken no previous anticoagulant or antiplatelet                            agents. ASA Grade Assessment: II - A patient with                            mild systemic disease. After reviewing the risks                            and benefits, the patient was deemed in                            satisfactory condition to undergo the procedure.  After obtaining informed consent, the colonoscope                            was passed under direct vision. Throughout the                            procedure, the patient's blood pressure, pulse, and                            oxygen saturations were monitored continuously. The                            PCF-HQ190L Colonoscope was introduced through the                            anus and advanced to  the the terminal ileum. The                            colonoscopy was performed without difficulty. The                            patient tolerated the procedure well. The quality                            of the bowel preparation was good. The terminal                            ileum, ileocecal valve, appendiceal orifice, and                            rectum were photographed. Scope In: 11:12:51 AM Scope Out: 11:40:51 AM Scope Withdrawal Time: 0 hours 19 minutes 38 seconds  Total Procedure Duration: 0 hours 28 minutes 0 seconds  Findings:                 The terminal ileum appeared normal.                           Three sessile polyps were found in the ascending                            colon. The polyps were 3 to 10 mm in size. These                            polyps were removed with a cold snare. Resection                            and retrieval were complete.                           Two sessile polyps were found in the rectum and                            sigmoid colon. The polyps were 4 to 8  mm in size.                            These polyps were removed with a cold snare.                            Resection and retrieval were complete.                           Non-bleeding internal hemorrhoids were found during                            retroflexion. Complications:            No immediate complications. Estimated Blood Loss:     Estimated blood loss was minimal. Impression:               - The examined portion of the ileum was normal.                           - Three 3 to 10 mm polyps in the ascending colon,                            removed with a cold snare. Resected and retrieved.                           - Two 4 to 8 mm polyps in the rectum and in the                            sigmoid colon, removed with a cold snare. Resected                            and retrieved.                           - Non-bleeding internal hemorrhoids. Recommendation:            - Discharge patient to home (with escort).                           - Await pathology results.                           - The findings and recommendations were discussed                            with the patient. Sonny Masters "Karen Pham,  10/04/2021 11:45:13 AM

## 2021-10-04 NOTE — Progress Notes (Signed)
Pt's states no medical or surgical changes since previsit or office visit. 

## 2021-10-04 NOTE — Patient Instructions (Signed)
  Handouts on Polyps and Hemorrhoids given.  YOU HAD AN ENDOSCOPIC PROCEDURE TODAY AT [facility]:   Refer to the procedure report that was given to you for any specific questions about what was found during the examination.  If the procedure report does not answer your questions, please call your gastroenterologist to clarify.  If you requested that your care partner not be given the details of your procedure findings, then the procedure report has been included in a sealed envelope for you to review at your convenience later.  YOU SHOULD EXPECT: Some feelings of bloating in the abdomen. Passage of more gas than usual.  Walking can help get rid of the air that was put into your GI tract during the procedure and reduce the bloating. If you had a lower endoscopy (such as a colonoscopy or flexible sigmoidoscopy) you may notice spotting of blood in your stool or on the toilet paper. If you underwent a bowel prep for your procedure, you may not have a normal bowel movement for a few days.  Please Note:  You might notice some irritation and congestion in your nose or some drainage.  This is from the oxygen used during your procedure.  There is no need for concern and it should clear up in a day or so.  SYMPTOMS TO REPORT IMMEDIATELY:  Following lower endoscopy (colonoscopy or flexible sigmoidoscopy):  Excessive amounts of blood in the stool  Significant tenderness or worsening of abdominal pains  Swelling of the abdomen that is new, acute  Fever of 100F or higher   For urgent or emergent issues, a gastroenterologist can be reached at any hour by calling [phone]. Do not use MyChart messaging for urgent concerns.    DIET:  We do recommend a small meal at first, but then you may proceed to your regular diet.  Drink plenty of fluids but you should avoid alcoholic beverages for 24 hours.  ACTIVITY:  You should plan to take it easy for the rest of today and you should NOT DRIVE  or use heavy machinery until tomorrow (because of the sedation medicines used during the test).    FOLLOW UP: Our staff will call the number listed on your records 48-72 hours following your procedure to check on you and address any questions or concerns that you may have regarding the information given to you following your procedure. If we do not reach you, we will leave a message.  We will attempt to reach you two times.  During this call, we will ask if you have developed any symptoms of COVID 19. If you develop any symptoms (ie: fever, flu-like symptoms, shortness of breath, cough etc.) before then, please call [phone].  If you test positive for Covid 19 in the 2 weeks post procedure, please call and report this information to [place].    If any biopsies were taken you will be contacted by phone or by letter within the next 1-3 weeks.  Please call us at [phone] if you have not heard about the biopsies in 3 weeks.    SIGNATURES/CONFIDENTIALITY: You and/or your care partner have signed paperwork which will be entered into your electronic medical record.  These signatures attest to the fact that that the information above on your After Visit Summary has been reviewed and is understood.  Full responsibility of the confidentiality of this discharge information lies with you and/or your care-partner.

## 2021-10-04 NOTE — Progress Notes (Signed)
To pacu, VSS. Report to [email or web address] 

## 2021-10-05 ENCOUNTER — Telehealth: Payer: Self-pay

## 2021-10-05 ENCOUNTER — Other Ambulatory Visit: Payer: Self-pay

## 2021-10-05 ENCOUNTER — Other Ambulatory Visit: Payer: Self-pay | Admitting: Critical Care Medicine

## 2021-10-05 ENCOUNTER — Other Ambulatory Visit (HOSPITAL_COMMUNITY): Payer: Self-pay | Admitting: Psychiatry

## 2021-10-05 ENCOUNTER — Telehealth: Payer: Self-pay | Admitting: *Deleted

## 2021-10-05 DIAGNOSIS — F418 Other specified anxiety disorders: Secondary | ICD-10-CM

## 2021-10-05 DIAGNOSIS — N6310 Unspecified lump in the right breast, unspecified quadrant: Secondary | ICD-10-CM

## 2021-10-05 MED ORDER — SERTRALINE HCL 50 MG PO TABS
ORAL_TABLET | ORAL | 3 refills | Status: DC
  Filled 2021-10-05 – 2021-11-27 (×2): qty 30, 30d supply, fill #0

## 2021-10-05 NOTE — Telephone Encounter (Signed)
  Follow up Call-     10/04/2021   10:19 AM  Call back number  Post procedure Call Back phone  # [phone]  Permission to leave phone message Yes     Patient questions:  Message left that we will call back after lunch.

## 2021-10-05 NOTE — Telephone Encounter (Signed)
  Follow up Call-     10/04/2021   10:19 AM  Call back number  Post procedure Call Back phone  # [phone]  Permission to leave phone message Yes     Patient questions:  Do you have a fever, pain , or abdominal swelling? No. Pain Score  0 *  Have you tolerated food without any problems? Yes.    Have you been able to return to your normal activities? Yes.    Do you have any questions about your discharge instructions: Diet   No. Medications  No. Follow up visit  No.  Do you have questions or concerns about your Care? No.  Actions: * If pain score is 4 or above: No action needed, pain <4.

## 2021-10-08 ENCOUNTER — Other Ambulatory Visit: Payer: Self-pay

## 2021-10-08 ENCOUNTER — Encounter: Payer: Self-pay | Admitting: Internal Medicine

## 2021-10-17 ENCOUNTER — Other Ambulatory Visit: Payer: Self-pay

## 2021-11-01 ENCOUNTER — Other Ambulatory Visit: Payer: Self-pay | Admitting: Critical Care Medicine

## 2021-11-01 ENCOUNTER — Ambulatory Visit
Admission: RE | Admit: 2021-11-01 | Discharge: 2021-11-01 | Disposition: A | Discharge status: Home / Self Care | Payer: Commercial Managed Care - HMO | Source: Ambulatory Visit | Attending: Critical Care Medicine | Admitting: Critical Care Medicine

## 2021-11-01 ENCOUNTER — Other Ambulatory Visit (HOSPITAL_COMMUNITY): Payer: Self-pay | Admitting: Psychiatry

## 2021-11-01 ENCOUNTER — Other Ambulatory Visit: Payer: Self-pay

## 2021-11-01 DIAGNOSIS — N6310 Unspecified lump in the right breast, unspecified quadrant: Secondary | ICD-10-CM

## 2021-11-01 DIAGNOSIS — N6320 Unspecified lump in the left breast, unspecified quadrant: Secondary | ICD-10-CM

## 2021-11-01 DIAGNOSIS — F418 Other specified anxiety disorders: Secondary | ICD-10-CM

## 2021-11-01 DIAGNOSIS — R599 Enlarged lymph nodes, unspecified: Secondary | ICD-10-CM

## 2021-11-05 ENCOUNTER — Other Ambulatory Visit (HOSPITAL_COMMUNITY): Payer: Self-pay | Admitting: Psychiatry

## 2021-11-05 ENCOUNTER — Other Ambulatory Visit: Payer: Self-pay

## 2021-11-05 DIAGNOSIS — F418 Other specified anxiety disorders: Secondary | ICD-10-CM

## 2021-11-12 ENCOUNTER — Other Ambulatory Visit: Payer: Commercial Managed Care - HMO

## 2021-11-20 ENCOUNTER — Other Ambulatory Visit: Payer: Commercial Managed Care - HMO

## 2021-11-27 ENCOUNTER — Ambulatory Visit
Admission: RE | Admit: 2021-11-27 | Discharge: 2021-11-27 | Disposition: A | Discharge status: Home / Self Care | Payer: Commercial Managed Care - HMO | Source: Ambulatory Visit | Attending: Critical Care Medicine | Admitting: Critical Care Medicine

## 2021-11-27 ENCOUNTER — Other Ambulatory Visit (HOSPITAL_COMMUNITY): Payer: Self-pay

## 2021-11-27 DIAGNOSIS — N63 Unspecified lump in unspecified breast: Secondary | ICD-10-CM

## 2021-11-27 DIAGNOSIS — N6310 Unspecified lump in the right breast, unspecified quadrant: Secondary | ICD-10-CM

## 2021-11-27 DIAGNOSIS — R599 Enlarged lymph nodes, unspecified: Secondary | ICD-10-CM

## 2021-11-28 ENCOUNTER — Other Ambulatory Visit: Payer: Self-pay | Admitting: Critical Care Medicine

## 2021-11-28 DIAGNOSIS — J9801 Acute bronchospasm: Secondary | ICD-10-CM

## 2021-11-28 MED ORDER — FLUTICASONE PROPIONATE HFA 44 MCG/ACT IN AERO
2.0000 | INHALATION_SPRAY | Freq: Two times a day (BID) | RESPIRATORY_TRACT | 3 refills | Status: DC
Start: 2021-11-28 — End: 2021-12-27

## 2021-11-28 MED ORDER — ALBUTEROL SULFATE HFA 108 (90 BASE) MCG/ACT IN AERS: 2.0000 | INHALATION_SPRAY | Freq: Three times a day (TID) | RESPIRATORY_TRACT | 2 refills | Status: DC | PRN

## 2021-11-28 NOTE — Progress Notes (Signed)
Pt aware of results neg for CA in breast

## 2021-11-30 ENCOUNTER — Other Ambulatory Visit (HOSPITAL_COMMUNITY): Payer: Self-pay

## 2021-12-27 ENCOUNTER — Encounter: Payer: Self-pay | Admitting: Critical Care Medicine

## 2021-12-27 ENCOUNTER — Telehealth: Payer: Self-pay | Admitting: Critical Care Medicine

## 2021-12-27 ENCOUNTER — Ambulatory Visit: Payer: Commercial Managed Care - HMO | Attending: Critical Care Medicine | Admitting: Critical Care Medicine

## 2021-12-27 VITALS — BP 156/86 | HR 72 | Temp 98.1°F | Ht 65.0 in | Wt 140.0 lb

## 2021-12-27 DIAGNOSIS — F4323 Adjustment disorder with mixed anxiety and depressed mood: Secondary | ICD-10-CM

## 2021-12-27 DIAGNOSIS — F1910 Other psychoactive substance abuse, uncomplicated: Secondary | ICD-10-CM

## 2021-12-27 DIAGNOSIS — I10 Essential (primary) hypertension: Secondary | ICD-10-CM | POA: Diagnosis not present

## 2021-12-27 DIAGNOSIS — Z122 Encounter for screening for malignant neoplasm of respiratory organs: Secondary | ICD-10-CM | POA: Insufficient documentation

## 2021-12-27 DIAGNOSIS — J449 Chronic obstructive pulmonary disease, unspecified: Secondary | ICD-10-CM | POA: Diagnosis not present

## 2021-12-27 DIAGNOSIS — Z720 Tobacco use: Secondary | ICD-10-CM

## 2021-12-27 DIAGNOSIS — R195 Other fecal abnormalities: Secondary | ICD-10-CM

## 2021-12-27 DIAGNOSIS — N63 Unspecified lump in unspecified breast: Secondary | ICD-10-CM

## 2021-12-27 DIAGNOSIS — J9801 Acute bronchospasm: Secondary | ICD-10-CM

## 2021-12-27 DIAGNOSIS — F17210 Nicotine dependence, cigarettes, uncomplicated: Secondary | ICD-10-CM | POA: Diagnosis not present

## 2021-12-27 DIAGNOSIS — E785 Hyperlipidemia, unspecified: Secondary | ICD-10-CM

## 2021-12-27 DIAGNOSIS — F1220 Cannabis dependence, uncomplicated: Secondary | ICD-10-CM

## 2021-12-27 DIAGNOSIS — F32A Depression, unspecified: Secondary | ICD-10-CM

## 2021-12-27 DIAGNOSIS — F419 Anxiety disorder, unspecified: Secondary | ICD-10-CM

## 2021-12-27 DIAGNOSIS — F418 Other specified anxiety disorders: Secondary | ICD-10-CM

## 2021-12-27 MED ORDER — ATORVASTATIN CALCIUM 40 MG PO TABS: 40.0000 mg | ORAL_TABLET | Freq: Every day | ORAL | 1 refills | Status: DC

## 2021-12-27 MED ORDER — AMLODIPINE BESYLATE 10 MG PO TABS: 10.0000 mg | ORAL_TABLET | Freq: Every day | ORAL | 1 refills | Status: DC

## 2021-12-27 MED ORDER — ALBUTEROL SULFATE HFA 108 (90 BASE) MCG/ACT IN AERS: 2.0000 | INHALATION_SPRAY | Freq: Three times a day (TID) | RESPIRATORY_TRACT | 2 refills | Status: DC | PRN

## 2021-12-27 MED ORDER — VALSARTAN 320 MG PO TABS
320.0000 mg | ORAL_TABLET | Freq: Every day | ORAL | 2 refills | Status: DC
Start: 2021-12-27 — End: 2022-06-04

## 2021-12-27 MED ORDER — BENZONATATE 100 MG PO CAPS: 100.0000 mg | ORAL_CAPSULE | Freq: Two times a day (BID) | ORAL | 0 refills | Status: DC | PRN

## 2021-12-27 MED ORDER — PREDNISONE 10 MG PO TABS: ORAL_TABLET | ORAL | 0 refills | Status: DC

## 2021-12-27 MED ORDER — FLUTICASONE-SALMETEROL 250-50 MCG/ACT IN AEPB
1.0000 | INHALATION_SPRAY | Freq: Two times a day (BID) | RESPIRATORY_TRACT | 1 refills | Status: DC
Start: 2021-12-27 — End: 2022-06-04

## 2021-12-27 MED ORDER — SERTRALINE HCL 50 MG PO TABS: 50.0000 mg | ORAL_TABLET | Freq: Every day | ORAL | 3 refills | Status: DC

## 2021-12-27 MED ORDER — GABAPENTIN 100 MG PO CAPS: 100.0000 mg | ORAL_CAPSULE | Freq: Three times a day (TID) | ORAL | 2 refills | Status: DC

## 2021-12-27 NOTE — Progress Notes (Signed)
Established Patient Office Visit  Subjective:  Patient ID: Karen Pham, female    DOB: 03/14/1976  Age: 46 y.o. MRN: [id number]  CC:  Chief Complaint  Patient presents with   Hypertension    HPI 1/18 Karen Pham presents for primary care follow up. She saw Karen Caldron, PA-C at this practice on 04/04/2021 to establish care. Her medications were refilled at that time, and labs were ordered for thyroid and magnesium levels, both of which were normal. She has had ongoing numbness and weakness in the hands and feet. She was referred to orthopedics. She attempted to go to that appointment, but her insurance is not active yet and she was stuck with a high copay that she was unable to pay. She states she has signed up for health insurance through [facility] and this is set to begin on Feb 1. She has rescheduled her appointment with orthopedics to February and she is hoping she will be able to go then.  The patient's main concerns are regarding her leg/foot pain and numbness in her right hand and wrist. She said the foot pain has been going on for awhile but is getting worse, but the numbness in the right hand and wrist is new. She gets foot swelling and pain especially after work. She works at [employer] and is on her feet all day. The pain in her feet causes her to limp by the end of the day. She has a feeling of pins and needles in her right hand and wrist that will wake her up from sleep. She says this is making it difficult for her to hold objects which is frustrating for her. She tries moving her hands, stretching them in and out to try to get feeling back. This helps somewhat.  She also has a recurring "sore" on her upper gumline. She says that it will periodically swell, fill with pus, and then burst. She said it is not painful today. She says that she has gotten used to having a lot of mouth pain and only really notices when it is really bad. She would like to go to a dentist and tried going to  the [facility], but was told that they could not help her.   She is currently taking Lisinopril-HCTZ and amlodipine for her hypertension. She says she does not take the Lisinopril-HCTZ on days where she works because she says she does not like that it makes her urinate so frequently. Her BP remains slightly elevated today at 147/88.    She is currently living in sober living housing. She says her housing is safe and secure for now, but she would eventually like her own place. 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 She is not using any other illicit substances right now. She is still smoking, but she has decreased her smoking from just over 1 PPD to 1/2 PPD. She says she thinks this has helped her breathing. She also uses prescribed inhalers. She says her mood is stable right now, and she follows with Karen Canner, NP for behavioral health  She is due for routine health screenings. She states she has had a mammogram and pap smear in the past, but does not remember when or if there were any significant results. She also needs Hepatitis C and HIV screenings, and she plans on getting these done at the lab here today. She thinks she has been screened for these before but  does not remember when. She needs colon cancer screening, and is willing to do the FOBT kit. She is also interested in getting the tDAP vaccine today.  08/21/2021 Patient seen in short-term follow-up from January visit still smoking half pack a day of cigarettes and has a congested cough.  She had a roommate who had a recent febrile illness who is being evaluated for COVID.  Patient was exposed to this individual.  She does work in Nurse, learning disability for [employer].  She is due a colonoscopy and a Pap smear and mammogram.  Colonoscopy has already been referred and they have been trying to reach the patient we will give her the contact information for this.  Patient is accompanied by her close  friend Karen Pham who is her primary healthcare advocate.  On arrival blood pressure is excellent 123/85.  Patient has no other complaints.  The patient remains sober at this time.  She had a carpal tunnel surgery done on the right hand recently and has improved with this. This patient also has a follow-up visit scheduled with dental  8/36 This a 46 year old female with COPD significant tobacco use smoking 1 to 2 packs of cigarettes every 2 to 3 days.  She has been smoking since the age of 63.  Patient notes increased wheezing shortness of breath.  On arrival blood pressure elevated 156/86.  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  She is trying to move into a different position.  Patient's had several cancer screenings performed.  She had a colonoscopy which showed tubular adenomas throughout the colon at least 5 different ones.  These have been removed.  Patient had a mammogram with fibrocystic disease seen she had a biopsy of her right breast 9 o'clock position which was benign however there is a concern that this may evolve into malignancy and she has a follow-up visit with general surgery.  Patient also has yet to achieve a lung scan low-dose to screen for lung cancer given her smoking history.  The patient has been taking the amlodipine 10 mg daily and the valsartan 160 mg daily.  Patient does not follow a healthy diet at times.  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  She stays at [facility].  She is accompanied by her advocate Karen Pham who is a retired Software engineer.  Patient significantly continues with periodontal disease and multiple carious worn teeth.  She cannot afford to have dental work done at this time.  She does not have a dental plan with her health insurance.   Past Medical History:  Diagnosis Date   COPD (chronic obstructive pulmonary disease) ([place])     Hypertension    Liver cirrhosis (HCC)    Severe cannabis use disorder ([place]) 11/14/2017    Past Surgical History:  Procedure Laterality Date   CARPAL TUNNEL RELEASE Right 07/16/2021   Procedure: RIGHT CARPAL TUNNEL RELEASE;  Surgeon: Sherilyn Cooter, MD;  Location: [facility];  Service: Orthopedics;  Laterality: Right;  regional with monitored anesthesia care    Family History  Problem Relation Age of Onset   Colon cancer Neg Hx    Esophageal cancer Neg Hx    Rectal cancer Neg Hx    Stomach cancer Neg Hx     Social History   Socioeconomic History   Marital status: Single    Spouse name: Not  on file   Number of children: Not on file   Years of education: Not on file   Highest education level: Not on file  Occupational History    Employer: [employer]  Tobacco Use   Smoking status: Every Day    Packs/day: 0.50    Types: Cigarettes   Smokeless tobacco: Never  Vaping Use   Vaping Use: Never used  Substance and Sexual Activity   Alcohol use: Not Currently   Drug use: Not Currently    Frequency: 1.0 times per week    Types: "Crack" cocaine, Cocaine, Other-see comments, Marijuana    Comment: 7 months clean per pt   Sexual activity: Not Currently  Other Topics Concern   Not on file  Social History Narrative   Right handed    Some caffeine   Social Determinants of Health   Financial Resource Strain: Medium Risk (05/15/2021)   Overall Financial Resource Strain (CARDIA)    Difficulty of Paying Living Expenses: Somewhat hard  Food Insecurity: Food Insecurity Present (05/15/2021)   Hunger Vital Sign    Worried About Running Out of Food in the Last Year: Sometimes true    Ran Out of Food in the Last Year: Sometimes true  Transportation Needs: No Transportation Needs (05/15/2021)   PRAPARE - Hydrologist (Medical): No    Lack of Transportation (Non-Medical): No  Physical Activity: Sufficiently Active (05/15/2021)   Exercise Vital  Sign    Days of Exercise per Week: 5 days    Minutes of Exercise per Session: 60 min  Stress: Stress Concern Present (05/15/2021)   [place]    Feeling of Stress : Very much  Social Connections: Socially Isolated (05/15/2021)   Social Connection and Isolation Panel [NHANES]    Frequency of Communication with Friends and Family: Three times a week    Frequency of Social Gatherings with Friends and Family: Once a week    Attends Religious Services: Never    Marine scientist or Organizations: No    Attends Archivist Meetings: Never    Marital Status: Never married  Intimate Partner Violence: Not At Risk (05/15/2021)   Humiliation, Afraid, Rape, and Kick questionnaire    Fear of Current or Ex-Partner: No    Emotionally Abused: No    Physically Abused: No    Sexually Abused: No    Outpatient Medications Prior to Visit  Medication Sig Dispense Refill   albuterol (VENTOLIN HFA) 108 (90 Base) MCG/ACT inhaler Inhale 2 puffs into the lungs 3 (three) times daily as needed. 18 g 2   amLODipine (NORVASC) 10 MG tablet Take 1 tablet (10 mg total) by mouth daily. 90 tablet 1   atorvastatin (LIPITOR) 40 MG tablet Take 1 tablet (40 mg total) by mouth daily. 90 tablet 1   fluticasone (FLOVENT HFA) 44 MCG/ACT inhaler Inhale 2 puffs into the lungs in the morning and at bedtime. 10.6 g 3   gabapentin (NEURONTIN) 100 MG capsule Take 1 capsule (100 mg total) by mouth 3 (three) times daily. (Patient taking differently: Take 100 mg by mouth 3 (three) times daily. 1 in AM and 2 at bedtime) 90 capsule 4   QUEtiapine (SEROQUEL) 50 MG tablet TAKE 1 TABLET BY MOUTH AT BEDTIME 30 tablet 0   sertraline (ZOLOFT) 50 MG tablet Take 1 tablet (50 mg total) by mouth daily. 30 tablet 3   valsartan (DIOVAN) 160 MG tablet Take 1  tablet (160 mg total) by mouth daily. 90 tablet 3   benzonatate (TESSALON) 100 MG capsule Take 1 capsule (100 mg  total) by mouth 2 (two) times daily as needed for cough. (Patient not taking: Reported on 12/27/2021) 20 capsule 0   hydrOXYzine (ATARAX) 25 MG tablet Take 1 tablet (25 mg total) by mouth 3 (three) times daily as needed. 30 tablet 3   sertraline (ZOLOFT) 50 MG tablet take 1 tablet by mouth once a day 30 tablet 3   No facility-administered medications prior to visit.    Allergies  Allergen Reactions   Latex Hives   Sulfa Antibiotics Hives    ROS Review of Systems  Constitutional:  Negative for fever and unexpected weight change.  HENT:  Positive for dental problem, ear pain, postnasal drip and rhinorrhea. Negative for ear discharge, hearing loss, mouth sores, sinus pressure, sinus pain and sore throat.   Eyes:  Negative for visual disturbance.  Respiratory:  Positive for cough, shortness of breath and wheezing.        Clear mucus  Cardiovascular:  Negative for chest pain.  Gastrointestinal:  Negative for diarrhea, nausea and vomiting.  Genitourinary:  Negative for frequency.  Musculoskeletal:  Negative for back pain and myalgias.  Neurological:  Negative for headaches.  Psychiatric/Behavioral:  Negative for dysphoric mood and suicidal ideas. The patient is nervous/anxious.       Objective:    Physical Exam Vitals reviewed.  Constitutional:      Appearance: Normal appearance. She is well-developed and normal weight. She is not diaphoretic.  HENT:     Head: Normocephalic and atraumatic.     Nose: Rhinorrhea present. No nasal deformity, septal deviation, mucosal edema or congestion.     Right Sinus: No maxillary sinus tenderness or frontal sinus tenderness.     Left Sinus: No maxillary sinus tenderness or frontal sinus tenderness.     Mouth/Throat:     Mouth: Mucous membranes are moist.     Dentition: Abnormal dentition (patient is missing most of her teeth). Dental caries and gum lesions present.     Tongue: No lesions.     Pharynx: Oropharynx is clear. No oropharyngeal  exudate.  Eyes:     General: No scleral icterus.    Conjunctiva/sclera: Conjunctivae normal.     Pupils: Pupils are equal, round, and reactive to light.  Neck:     Thyroid: No thyromegaly.     Vascular: No carotid bruit or JVD.     Trachea: Trachea normal. No tracheal tenderness or tracheal deviation.  Cardiovascular:     Rate and Rhythm: Normal rate and regular rhythm.     Chest Wall: PMI is not displaced.     Pulses: Normal pulses. No decreased pulses.          Dorsalis pedis pulses are 2+ on the right side and 2+ on the left side.       Posterior tibial pulses are 2+ on the right side and 2+ on the left side.     Heart sounds: Normal heart sounds, S1 normal and S2 normal. Heart sounds not distant. No murmur heard.    No systolic murmur is present.     No diastolic murmur is present.     No friction rub. No gallop. No S3 or S4 sounds.  Pulmonary:     Effort: Pulmonary effort is normal. No tachypnea, accessory muscle usage or respiratory distress.     Breath sounds: Normal air entry. No stridor. Wheezing present. No decreased  breath sounds, rhonchi or rales.     Comments: Poor breath sounds Chest:     Chest wall: No tenderness.  Abdominal:     General: Bowel sounds are normal. There is no distension.     Palpations: Abdomen is soft. Abdomen is not rigid.     Tenderness: There is no abdominal tenderness. There is no guarding or rebound.  Musculoskeletal:        General: Normal range of motion.     Right hand: No swelling or deformity. Normal strength.     Left hand: No swelling or deformity. Normal strength.     Cervical back: Normal range of motion and neck supple. No edema, erythema or rigidity. No muscular tenderness. Normal range of motion.     Right lower leg: No edema.     Left lower leg: No edema.     Right foot: Normal pulse.     Left foot: Normal pulse.  Lymphadenopathy:     Head:     Right side of head: No submental or submandibular adenopathy.     Left side of  head: No submental or submandibular adenopathy.     Cervical: No cervical adenopathy.  Skin:    General: Skin is warm and dry.     Coloration: Skin is not pale.     Findings: No rash.     Nails: There is no clubbing.  Neurological:     General: No focal deficit present.     Mental Status: She is alert and oriented to person, place, and time.     Sensory: No sensory deficit.     Motor: No weakness.  Psychiatric:        Mood and Affect: Mood normal.        Speech: Speech normal.        Behavior: Behavior normal.        Thought Content: Thought content normal.        Judgment: Judgment normal.     BP (!) 156/86   Pulse 72   Temp 98.1 F (36.7 C) (Oral)   Ht 5' 5"  (1.651 m)   Wt 140 lb (63.5 kg)   SpO2 99%   BMI 23.30 kg/m  Wt Readings from Last 3 Encounters:  12/27/21 140 lb (63.5 kg)  10/04/21 134 lb (60.8 kg)  09/14/21 134 lb (60.8 kg)     Health Maintenance Due  Topic Date Due   COVID-19 Vaccine (1) Never done   INFLUENZA VACCINE  12/04/2021    There are no preventive care reminders to display for this patient.  Lab Results  Component Value Date   TSH 2.040 04/04/2021   Lab Results  Component Value Date   WBC 8.5 05/23/2021   HGB 13.9 05/23/2021   HCT 39.9 05/23/2021   MCV 92 05/23/2021   PLT 324 05/23/2021   Lab Results  Component Value Date   NA 135 07/13/2021   K 4.3 07/13/2021   CO2 22 07/13/2021   GLUCOSE 102 (H) 07/13/2021   BUN 10 07/13/2021   CREATININE 0.88 07/13/2021   BILITOT 0.5 07/13/2021   ALKPHOS 40 07/13/2021   AST 19 07/13/2021   ALT 13 07/13/2021   PROT 6.1 (L) 07/13/2021   ALBUMIN 3.7 07/13/2021   CALCIUM 9.1 07/13/2021   ANIONGAP 9 07/13/2021   EGFR 91 05/23/2021   Lab Results  Component Value Date   CHOL 153 05/23/2021   Lab Results  Component Value Date   HDL 49 05/23/2021  Lab Results  Component Value Date   LDLCALC 92 05/23/2021   Lab Results  Component Value Date   TRIG 56 05/23/2021   Lab Results   Component Value Date   CHOLHDL 3.1 05/23/2021   No results found for: "HGBA1C" Colonoscopy 10/04/21 - The examined portion of the ileum was normal.                           - Three 3 to 10 mm polyps in the ascending colon,                            removed with a cold snare. Resected and retrieved.                           - Two 4 to 8 mm polyps in the rectum and in the                            sigmoid colon, removed with a cold snare. Resected                            and retrieved.                           - Non-bleeding internal hemorrhoids. Recommendation:           - Discharge patient to home (with escort).                           - Await pathology results.                           - The findings and recommendations were discussed                            with the patient. Sonny Masters "Claire" [place],   Path from Colon: Surgical [P], colon, ascending, polyp (3) - TUBULAR ADENOMA, NEGATIVE FOR HIGH-GRADE DYSPLASIA. - SERRATED MUCOSAL POLYPS WITH MILD BASAL GROWTH ABNORMALITY, FAVOR SESSILE SERRATED POLYPS, NEGATIVE FOR DYSPLASIA. 2. Surgical [P], colon, rectum and sigmoid, polyp (2) - TUBULAR ADENOMAS, NEGATIVE FOR HIGH-GRADE DYSPLASIA  Path from Right Breast Bx 11/2021 Breast, right, needle core biopsy, outer, 9 o'clock, 4cmfn, ribbon clip COMPLEX SCLEROSING LESION ADJACENT FIBROMATOID NODULE AND FIBROCYSTIC CHANGES INCLUDING STROMAL FIBROSIS, ADENOSIS AND USUAL DUCT HYPERPLASIA FOCAL PSEUDOANGIOMATOUS STROMAL HYPERPLASIA ([place]) NEGATIVE FOR MICROCALCIFICATIONS NEGATIVE FOR CARCINOMA 2. Lymph node, needle/core biopsy, right axilla, hydromark spiral clip BENIGN FIBROADIPOSE AND FIBROMUSCULAR STROMA NEGATIVE FOR LYMPH NODE NEGATIVE FOR MALIGNANCY   Assessment & Plan:   Problem List Items Addressed This Visit       Cardiovascular and Mediastinum   Hypertension - Primary    Hypertension not well controlled patient not eating healthier foods  We will increase  valsartan to 320 mg daily Continue amlodipine 10 mg daily The following Lifestyle Medicine recommendations according to [place] of Lifestyle Medicine [facility]) were discussed and offered to patient who agrees to start the journey:  A. Whole Foods, Plant-based plate comprising of fruits and vegetables, plant-based proteins, whole-grain carbohydrates was discussed in detail with the patient.   A list for source  of those nutrients were also provided to the patient.  Patient will use only water or unsweetened tea for hydration. B.  The need to stay away from risky substances including alcohol, smoking; obtaining 7 to 9 hours of restorative sleep, at least 150 minutes of moderate intensity exercise weekly, the importance of healthy social connections,  and stress reduction techniques were discussed. C.  A full color page of  Calorie density of various food groups per pound showing examples of each food groups was provided to the patient.  Reduce salt in diet f/u with handout       Relevant Medications   amLODipine (NORVASC) 10 MG tablet   atorvastatin (LIPITOR) 40 MG tablet   valsartan (DIOVAN) 320 MG tablet     Respiratory   COPD with chronic bronchitis (HCC)    COPD with asthmatic component exacerbated by tobacco use  See tobacco assessment  Poor oral health also contributing to recurrent exacerbations  Plan to switch Flovent to Advair 1 puff twice daily  Pulse prednisone administered      Relevant Medications   albuterol (VENTOLIN HFA) 108 (90 Base) MCG/ACT inhaler   benzonatate (TESSALON) 100 MG capsule   fluticasone-salmeterol (ADVAIR) 250-50 MCG/ACT AEPB   predniSONE (DELTASONE) 10 MG tablet     Digestive   Colon polyps tubular adenomas    Patient with colon polyps tubular adenomas follow-up per gastroenterology these have been removed        Other   Hyperlipidemia   Relevant Medications   amLODipine (NORVASC) 10 MG tablet   atorvastatin (LIPITOR) 40 MG tablet    valsartan (DIOVAN) 320 MG tablet   Adjustment disorder with mixed anxiety and depressed mood    Plan to discontinue Seroquel but continue sertraline      Depression with anxiety   Relevant Medications   sertraline (ZOLOFT) 50 MG tablet   Breast mass in female    Fibrocystic disease on biopsy has follow-up with general surgery may end up with a lumpectomy      Tobacco use    Continue working on smoking reduction and cessation as discussed.    Current smoking consumption amount:1 PPD  Dicsussion on advise to quit smoking and smoking impacts: CV lung impacts  Patient's willingness to quit:  In sober living for subst use/ etoh will be a challenge  Methods to quit smoking discussed:  behav mod referral to licensed clinical social work made  Medication management of smoking session drugs discussed: Begin nicotine patch along with nicotine lozenge in conjunction  Resources provided:  AVS   Setting quit date not established  Follow-up arranged 3 weeks  Time spent counseling the patient:  5 min       Encounter for screening for lung cancer    Plan low-dose lung cancer screening with CT scan      Relevant Orders   CT CHEST LUNG CA SCREEN LOW DOSE W/O CM   RESOLVED: Substance abuse ([place])    Patient is sober no active substance use      RESOLVED: Severe cannabis use disorder ([place])    Claims to not be using marijuana now      Other Visit Diagnoses     Bronchospasm       Relevant Medications   albuterol (VENTOLIN HFA) 108 (90 Base) MCG/ACT inhaler   Anxiety and depression       Relevant Medications   gabapentin (NEURONTIN) 100 MG capsule   sertraline (ZOLOFT) 50 MG tablet  Meds ordered this encounter  Medications   albuterol (VENTOLIN HFA) 108 (90 Base) MCG/ACT inhaler    Sig: Inhale 2 puffs into the lungs 3 (three) times daily as needed.    Dispense:  18 g    Refill:  2   amLODipine (NORVASC) 10 MG tablet    Sig: Take 1 tablet (10 mg total) by mouth  daily.    Dispense:  90 tablet    Refill:  1   atorvastatin (LIPITOR) 40 MG tablet    Sig: Take 1 tablet (40 mg total) by mouth daily.    Dispense:  90 tablet    Refill:  1   benzonatate (TESSALON) 100 MG capsule    Sig: Take 1 capsule (100 mg total) by mouth 2 (two) times daily as needed for cough.    Dispense:  20 capsule    Refill:  0   gabapentin (NEURONTIN) 100 MG capsule    Sig: Take 1 capsule (100 mg total) by mouth 3 (three) times daily. 1 in AM and 2 at bedtime    Dispense:  120 capsule    Refill:  2   sertraline (ZOLOFT) 50 MG tablet    Sig: Take 1 tablet (50 mg total) by mouth daily.    Dispense:  30 tablet    Refill:  3   valsartan (DIOVAN) 320 MG tablet    Sig: Take 1 tablet (320 mg total) by mouth daily.    Dispense:  90 tablet    Refill:  2   fluticasone-salmeterol (ADVAIR) 250-50 MCG/ACT AEPB    Sig: Inhale 1 puff into the lungs in the morning and at bedtime.    Dispense:  60 each    Refill:  1    On next inhaler refill, then dc flovent   predniSONE (DELTASONE) 10 MG tablet    Sig: Take 4 tablets daily for 5 days then stop    Dispense:  20 tablet    Refill:  0  Multiple systems assessed 40 minutes spent with patient High risk on multiple areas multiple cancer screenings severe hypertension Follow-up: Return in about 3 weeks (around 01/17/2022) for htn, copd.    Asencion Noble, MD

## 2021-12-27 NOTE — Assessment & Plan Note (Signed)
Patient is sober no active substance use

## 2021-12-27 NOTE — Assessment & Plan Note (Signed)
COPD with asthmatic component exacerbated by tobacco use  See tobacco assessment  Poor oral health also contributing to recurrent exacerbations  Plan to switch Flovent to Advair 1 puff twice daily  Pulse prednisone administered

## 2021-12-27 NOTE — Progress Notes (Signed)
Pt states that she is still having issues breathing. Pt gets winded easily.

## 2021-12-27 NOTE — Telephone Encounter (Signed)
Please see this patient for smoking cessation counseling and anxiety counseling prior history of alcohol use status post rehab she is now sober 11 months

## 2021-12-27 NOTE — Assessment & Plan Note (Signed)
Plan to discontinue Seroquel but continue sertraline

## 2021-12-27 NOTE — Assessment & Plan Note (Signed)
Fibrocystic disease on biopsy has follow-up with general surgery may end up with a lumpectomy

## 2021-12-27 NOTE — Assessment & Plan Note (Signed)
Claims to not be using marijuana now

## 2021-12-27 NOTE — Patient Instructions (Signed)
Low-dose lung cancer screening CT scan will be ordered  Focus on smoking cessation use nicotine patch 21 mg daily and also a 4 mg nicotine lozenge can be taken as needed for prevention of smoking.  Also referral to licensed clinical social work Ms. Nevada Crane will be made.  Increase valsartan to 320 mg daily and stay on the amlodipine 10 mg daily for blood pressure  Take prednisone 4 daily for 5 days then stop for lung inflammation  Stay on the Flovent you are using now but on the refill you will be on the Advair Diskus he will do 1 inhalation twice daily this is the only combination inhaler your insurance will cover  Refills on all her medications sent to pharmacy  Return to see Dr. Joya Gaskins 3 weeks for blood pressure recheck  Keep your follow-up visit with the breast surgeon to discuss the place on your right breast

## 2021-12-27 NOTE — Assessment & Plan Note (Signed)
Continue working on smoking reduction and cessation as discussed.    . Current smoking consumption amount:1 PPD  . Dicsussion on advise to quit smoking and smoking impacts: CV lung impacts  . Patient's willingness to quit:  In sober living for subst use/ etoh will be a challenge  . Methods to quit smoking discussed:  behav mod referral to licensed clinical social work made  . Medication management of smoking session drugs discussed: Begin nicotine patch along with nicotine lozenge in conjunction  . Resources provided:  AVS   . Setting quit date not established  Follow-up arranged 3 weeks  Time spent counseling the patient:  5 min

## 2021-12-27 NOTE — Assessment & Plan Note (Signed)
Hypertension not well controlled patient not eating healthier foods  We will increase valsartan to 320 mg daily Continue amlodipine 10 mg daily The following Lifestyle Medicine recommendations according to [facility]) were discussed and offered to patient who agrees to start the journey:  A. Whole Foods, Plant-based plate comprising of fruits and vegetables, plant-based proteins, whole-grain carbohydrates was discussed in detail with the patient.   A list for source of those nutrients were also provided to the patient.  Patient will use only water or unsweetened tea for hydration. B.  The need to stay away from risky substances including alcohol, smoking; obtaining 7 to 9 hours of restorative sleep, at least 150 minutes of moderate intensity exercise weekly, the importance of healthy social connections,  and stress reduction techniques were discussed. C.  A full color page of  Calorie density of various food groups per pound showing examples of each food groups was provided to the patient.  Reduce salt in diet f/u with handout

## 2021-12-27 NOTE — Assessment & Plan Note (Signed)
Plan low-dose lung cancer screening with CT scan

## 2021-12-27 NOTE — Assessment & Plan Note (Signed)
Patient with colon polyps tubular adenomas follow-up per gastroenterology these have been removed

## 2022-01-02 NOTE — Telephone Encounter (Signed)
LCSWA called patient today to introduce herself and to assess patients' mental health needs. Patient did not answer the phone. LCSWA was able to leave a brief message with the patient asking them to return the call. Patient was referred by PCP for smoking cessation counseling and anxiety.

## 2022-01-10 ENCOUNTER — Other Ambulatory Visit: Payer: Self-pay

## 2022-01-14 NOTE — Telephone Encounter (Signed)
Patient returned call back. I let her know from the message about the referral for anxiety and smoking cessation. Please call back if need be.

## 2022-01-15 NOTE — Telephone Encounter (Signed)
Returned patient call. Patient did not answer. LCSWA left a brief message asking her to call back to discuss counseling services available.

## 2022-01-21 NOTE — Progress Notes (Unsigned)
Established Patient Office Visit  Subjective:  Patient ID: Karen Pham, female    DOB: [birth year]  Age: 46 y.o. MRN: [id number]  CC:  No chief complaint on file.   HPI 1/18 Karen Pham presents for primary care follow up. She saw Karen Caldron, PA-C at this practice on 04/04/2021 to establish care. Her medications were refilled at that time, and labs were ordered for thyroid and magnesium levels, both of which were normal. She has had ongoing numbness and weakness in the hands and feet. She was referred to orthopedics. She attempted to go to that appointment, but her insurance is not active yet and she was stuck with a high copay that she was unable to pay. She states she has signed up for health insurance through [place] and this is set to begin on Feb 1. She has rescheduled her appointment with orthopedics to February and she is hoping she will be able to go then.  The patient's main concerns are regarding her leg/foot pain and numbness in her right hand and wrist. She said the foot pain has been going on for awhile but is getting worse, but the numbness in the right hand and wrist is new. She gets foot swelling and pain especially after work. She works at [employer] and is on her feet all day. The pain in her feet causes her to limp by the end of the day. She has a feeling of pins and needles in her right hand and wrist that will wake her up from sleep. She says this is making it difficult for her to hold objects which is frustrating for her. She tries moving her hands, stretching them in and out to try to get feeling back. This helps somewhat.  She also has a recurring "sore" on her upper gumline. She says that it will periodically swell, fill with pus, and then burst. She said it is not painful today. She says that she has gotten used to having a lot of mouth pain and only really notices when it is really bad. She would like to go to a dentist and tried going to the [facility]  clinic, but was told that they could not help her.   She is currently taking Lisinopril-HCTZ and amlodipine for her hypertension. She says she does not take the Lisinopril-HCTZ on days where she works because she says she does not like that it makes her urinate so frequently. Her BP remains slightly elevated today at 147/88.    She is currently living in sober living housing. She says her housing is safe and secure for now, but she would eventually like her own place. 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 She is not using any other illicit substances right now. She is still smoking, but she has decreased her smoking from just over 1 PPD to 1/2 PPD. She says she thinks this has helped her breathing. She also uses prescribed inhalers. She says her mood is stable right now, and she follows with Karen Canner, NP for behavioral health  She is due for routine health screenings. She states she has had a mammogram and pap smear in the past, but does not remember when or if there were any significant results. She also needs Hepatitis C and HIV screenings, and she plans on getting these done at the lab here today. She thinks she has been screened for these before but does not remember when. She  needs colon cancer screening, and is willing to do the FOBT kit. She is also interested in getting the tDAP vaccine today.  08/21/2021 Patient seen in short-term follow-up from January visit still smoking half pack a day of cigarettes and has a congested cough.  She had a roommate who had a recent febrile illness who is being evaluated for COVID.  Patient was exposed to this individual.  She does work in Nurse, learning disability for [employer].  She is due a colonoscopy and a Pap smear and mammogram.  Colonoscopy has already been referred and they have been trying to reach the patient we will give her the contact information for this.  Patient is accompanied by her close friend Karen Pham who is her primary  healthcare advocate.  On arrival blood pressure is excellent 123/85.  Patient has no other complaints.  The patient remains sober at this time.  She had a carpal tunnel surgery done on the right hand recently and has improved with this. This patient also has a follow-up visit scheduled with dental  8/78 This a 46 year old female with COPD significant tobacco use smoking 1 to 2 packs of cigarettes every 2 to 3 days.  She has been smoking since the age of 58.  Patient notes increased wheezing shortness of breath.  On arrival blood pressure elevated 156/86.  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  She is trying to move into a different position.  Patient's had several cancer screenings performed.  She had a colonoscopy which showed tubular adenomas throughout the colon at least 5 different ones.  These have been removed.  Patient had a mammogram with fibrocystic disease seen she had a biopsy of her right breast 9 o'clock position which was benign however there is a concern that this may evolve into malignancy and she has a follow-up visit with general surgery.  Patient also has yet to achieve a lung scan low-dose to screen for lung cancer given her smoking history.  The patient has been taking the amlodipine 10 mg daily and the valsartan 160 mg daily.  Patient does not follow a healthy diet at times.  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  She stays at [facility].  She is accompanied by her advocate Karen Pham who is a retired Software engineer.  Patient significantly continues with periodontal disease and multiple carious worn teeth.  She cannot afford to have dental work done at this time.  She does not have a dental plan with her health insurance.  9/19  Past Medical History:  Diagnosis Date   COPD (chronic obstructive pulmonary disease) ([place])    Hypertension    Liver cirrhosis ([facility])     Severe cannabis use disorder ([place]) 11/14/2017    Past Surgical History:  Procedure Laterality Date   CARPAL TUNNEL RELEASE Right 07/16/2021   Procedure: RIGHT CARPAL TUNNEL RELEASE;  Surgeon: Sherilyn Cooter, MD;  Location: [facility];  Service: Orthopedics;  Laterality: Right;  regional with monitored anesthesia care    Family History  Problem Relation Age of Onset   Colon cancer Neg Hx    Esophageal cancer Neg Hx    Rectal cancer Neg Hx    Stomach cancer Neg Hx     Social History   Socioeconomic History   Marital status: Single    Spouse name: Not on file  Number of children: Not on file   Years of education: Not on file   Highest education level: Not on file  Occupational History    Employer: [employer]  Tobacco Use   Smoking status: Every Day    Packs/day: 0.50    Types: Cigarettes   Smokeless tobacco: Never  Vaping Use   Vaping Use: Never used  Substance and Sexual Activity   Alcohol use: Not Currently   Drug use: Not Currently    Frequency: 1.0 times per week    Types: "Crack" cocaine, Cocaine, Other-see comments, Marijuana    Comment: 7 months clean per pt   Sexual activity: Not Currently  Other Topics Concern   Not on file  Social History Narrative   Right handed    Some caffeine   Social Determinants of Health   Financial Resource Strain: Medium Risk (05/15/2021)   Overall Financial Resource Strain (CARDIA)    Difficulty of Paying Living Expenses: Somewhat hard  Food Insecurity: Food Insecurity Present (05/15/2021)   Hunger Vital Sign    Worried About Running Out of Food in the Last Year: Sometimes true    Ran Out of Food in the Last Year: Sometimes true  Transportation Needs: No Transportation Needs (05/15/2021)   PRAPARE - Hydrologist (Medical): No    Lack of Transportation (Non-Medical): No  Physical Activity: Sufficiently Active (05/15/2021)   Exercise Vital Sign    Days of Exercise per Week: 5  days    Minutes of Exercise per Session: 60 min  Stress: Stress Concern Present (05/15/2021)   [place]    Feeling of Stress : Very much  Social Connections: Socially Isolated (05/15/2021)   Social Connection and Isolation Panel [NHANES]    Frequency of Communication with Friends and Family: Three times a week    Frequency of Social Gatherings with Friends and Family: Once a week    Attends Religious Services: Never    Marine scientist or Organizations: No    Attends Archivist Meetings: Never    Marital Status: Never married  Intimate Partner Violence: Not At Risk (05/15/2021)   Humiliation, Afraid, Rape, and Kick questionnaire    Fear of Current or Ex-Partner: No    Emotionally Abused: No    Physically Abused: No    Sexually Abused: No    Outpatient Medications Prior to Visit  Medication Sig Dispense Refill   albuterol (VENTOLIN HFA) 108 (90 Base) MCG/ACT inhaler Inhale 2 puffs into the lungs 3 (three) times daily as needed. 18 g 2   amLODipine (NORVASC) 10 MG tablet Take 1 tablet (10 mg total) by mouth daily. 90 tablet 1   atorvastatin (LIPITOR) 40 MG tablet Take 1 tablet (40 mg total) by mouth daily. 90 tablet 1   benzonatate (TESSALON) 100 MG capsule Take 1 capsule (100 mg total) by mouth 2 (two) times daily as needed for cough. 20 capsule 0   fluticasone-salmeterol (ADVAIR) 250-50 MCG/ACT AEPB Inhale 1 puff into the lungs in the morning and at bedtime. 60 each 1   gabapentin (NEURONTIN) 100 MG capsule Take 1 capsule (100 mg total) by mouth 3 (three) times daily. 1 in AM and 2 at bedtime 120 capsule 2   predniSONE (DELTASONE) 10 MG tablet Take 4 tablets daily for 5 days then stop 20 tablet 0   sertraline (ZOLOFT) 50 MG tablet Take 1 tablet (50 mg total) by mouth daily. [place]  tablet 3   valsartan (DIOVAN) 320 MG tablet Take 1 tablet (320 mg total) by mouth daily. 90 tablet 2   No facility-administered  medications prior to visit.    Allergies  Allergen Reactions   Latex Hives   Sulfa Antibiotics Hives    ROS Review of Systems  Constitutional:  Negative for fever and unexpected weight change.  HENT:  Positive for dental problem, ear pain, postnasal drip and rhinorrhea. Negative for ear discharge, hearing loss, mouth sores, sinus pressure, sinus pain and sore throat.   Eyes:  Negative for visual disturbance.  Respiratory:  Positive for cough, shortness of breath and wheezing.        Clear mucus  Cardiovascular:  Negative for chest pain.  Gastrointestinal:  Negative for diarrhea, nausea and vomiting.  Genitourinary:  Negative for frequency.  Musculoskeletal:  Negative for back pain and myalgias.  Neurological:  Negative for headaches.  Psychiatric/Behavioral:  Negative for dysphoric mood and suicidal ideas. The patient is nervous/anxious.       Objective:    Physical Exam Vitals reviewed.  Constitutional:      Appearance: Normal appearance. She is well-developed and normal weight. She is not diaphoretic.  HENT:     Head: Normocephalic and atraumatic.     Nose: Rhinorrhea present. No nasal deformity, septal deviation, mucosal edema or congestion.     Right Sinus: No maxillary sinus tenderness or frontal sinus tenderness.     Left Sinus: No maxillary sinus tenderness or frontal sinus tenderness.     Mouth/Throat:     Mouth: Mucous membranes are moist.     Dentition: Abnormal dentition (patient is missing most of her teeth). Dental caries and gum lesions present.     Tongue: No lesions.     Pharynx: Oropharynx is clear. No oropharyngeal exudate.  Eyes:     General: No scleral icterus.    Conjunctiva/sclera: Conjunctivae normal.     Pupils: Pupils are equal, round, and reactive to light.  Neck:     Thyroid: No thyromegaly.     Vascular: No carotid bruit or JVD.     Trachea: Trachea normal. No tracheal tenderness or tracheal deviation.  Cardiovascular:     Rate and Rhythm:  Normal rate and regular rhythm.     Chest Wall: PMI is not displaced.     Pulses: Normal pulses. No decreased pulses.          Dorsalis pedis pulses are 2+ on the right side and 2+ on the left side.       Posterior tibial pulses are 2+ on the right side and 2+ on the left side.     Heart sounds: Normal heart sounds, S1 normal and S2 normal. Heart sounds not distant. No murmur heard.    No systolic murmur is present.     No diastolic murmur is present.     No friction rub. No gallop. No S3 or S4 sounds.  Pulmonary:     Effort: Pulmonary effort is normal. No tachypnea, accessory muscle usage or respiratory distress.     Breath sounds: Normal air entry. No stridor. Wheezing present. No decreased breath sounds, rhonchi or rales.     Comments: Poor breath sounds Chest:     Chest wall: No tenderness.  Abdominal:     General: Bowel sounds are normal. There is no distension.     Palpations: Abdomen is soft. Abdomen is not rigid.     Tenderness: There is no abdominal tenderness. There is no guarding  or rebound.  Musculoskeletal:        General: Normal range of motion.     Right hand: No swelling or deformity. Normal strength.     Left hand: No swelling or deformity. Normal strength.     Cervical back: Normal range of motion and neck supple. No edema, erythema or rigidity. No muscular tenderness. Normal range of motion.     Right lower leg: No edema.     Left lower leg: No edema.     Right foot: Normal pulse.     Left foot: Normal pulse.  Lymphadenopathy:     Head:     Right side of head: No submental or submandibular adenopathy.     Left side of head: No submental or submandibular adenopathy.     Cervical: No cervical adenopathy.  Skin:    General: Skin is warm and dry.     Coloration: Skin is not pale.     Findings: No rash.     Nails: There is no clubbing.  Neurological:     General: No focal deficit present.     Mental Status: She is alert and oriented to person, place, and time.      Sensory: No sensory deficit.     Motor: No weakness.  Psychiatric:        Mood and Affect: Mood normal.        Speech: Speech normal.        Behavior: Behavior normal.        Thought Content: Thought content normal.        Judgment: Judgment normal.     There were no vitals taken for this visit. Wt Readings from Last 3 Encounters:  12/27/21 140 lb (63.5 kg)  10/04/21 134 lb (60.8 kg)  09/14/21 134 lb (60.8 kg)     Health Maintenance Due  Topic Date Due   COVID-19 Vaccine (1) Never done   INFLUENZA VACCINE  Never done    There are no preventive care reminders to display for this patient.  Lab Results  Component Value Date   TSH 2.040 04/04/2021   Lab Results  Component Value Date   WBC 8.5 05/23/2021   HGB 13.9 05/23/2021   HCT 39.9 05/23/2021   MCV 92 05/23/2021   PLT 324 05/23/2021   Lab Results  Component Value Date   NA 135 07/13/2021   K 4.3 07/13/2021   CO2 22 07/13/2021   GLUCOSE 102 (H) 07/13/2021   BUN 10 07/13/2021   CREATININE 0.88 07/13/2021   BILITOT 0.5 07/13/2021   ALKPHOS 40 07/13/2021   AST 19 07/13/2021   ALT 13 07/13/2021   PROT 6.1 (L) 07/13/2021   ALBUMIN 3.7 07/13/2021   CALCIUM 9.1 07/13/2021   ANIONGAP 9 07/13/2021   EGFR 91 05/23/2021   Lab Results  Component Value Date   CHOL 153 05/23/2021   Lab Results  Component Value Date   HDL 49 05/23/2021   Lab Results  Component Value Date   LDLCALC 92 05/23/2021   Lab Results  Component Value Date   TRIG 56 05/23/2021   Lab Results  Component Value Date   CHOLHDL 3.1 05/23/2021   No results found for: "HGBA1C" Colonoscopy 10/04/21 - The examined portion of the ileum was normal.                           - Three 3 to 10 mm polyps in the ascending colon,  removed with a cold snare. Resected and retrieved.                           - Two 4 to 8 mm polyps in the rectum and in the                            sigmoid colon, removed with a cold  snare. Resected                            and retrieved.                           - Non-bleeding internal hemorrhoids. Recommendation:           - Discharge patient to home (with escort).                           - Await pathology results.                           - The findings and recommendations were discussed                            with the patient. Sonny Masters "Claire" Oso,   Path from Colon: Surgical [P], colon, ascending, polyp (3) - TUBULAR ADENOMA, NEGATIVE FOR HIGH-GRADE DYSPLASIA. - SERRATED MUCOSAL POLYPS WITH MILD BASAL GROWTH ABNORMALITY, FAVOR SESSILE SERRATED POLYPS, NEGATIVE FOR DYSPLASIA. 2. Surgical [P], colon, rectum and sigmoid, polyp (2) - TUBULAR ADENOMAS, NEGATIVE FOR HIGH-GRADE DYSPLASIA  Path from Right Breast Bx 11/2021 Breast, right, needle core biopsy, outer, 9 o'clock, 4cmfn, ribbon clip COMPLEX SCLEROSING LESION ADJACENT FIBROMATOID NODULE AND FIBROCYSTIC CHANGES INCLUDING STROMAL FIBROSIS, ADENOSIS AND USUAL DUCT HYPERPLASIA FOCAL PSEUDOANGIOMATOUS STROMAL HYPERPLASIA ([place]) NEGATIVE FOR MICROCALCIFICATIONS NEGATIVE FOR CARCINOMA 2. Lymph node, needle/core biopsy, right axilla, hydromark spiral clip BENIGN FIBROADIPOSE AND FIBROMUSCULAR STROMA NEGATIVE FOR LYMPH NODE NEGATIVE FOR MALIGNANCY   Assessment & Plan:   Problem List Items Addressed This Visit   None No orders of the defined types were placed in this encounter. Multiple systems assessed 40 minutes spent with patient High risk on multiple areas multiple cancer screenings severe hypertension Follow-up: No follow-ups on file.    Asencion Noble, MD

## 2022-01-22 ENCOUNTER — Telehealth: Payer: Self-pay | Admitting: Critical Care Medicine

## 2022-01-22 ENCOUNTER — Encounter: Payer: Self-pay | Admitting: Critical Care Medicine

## 2022-01-22 ENCOUNTER — Ambulatory Visit: Payer: Commercial Managed Care - HMO | Attending: Critical Care Medicine | Admitting: Critical Care Medicine

## 2022-01-22 VITALS — BP 148/87 | HR 73 | Ht 65.3 in | Wt 146.2 lb

## 2022-01-22 DIAGNOSIS — K047 Periapical abscess without sinus: Secondary | ICD-10-CM | POA: Diagnosis not present

## 2022-01-22 DIAGNOSIS — Z720 Tobacco use: Secondary | ICD-10-CM

## 2022-01-22 DIAGNOSIS — J449 Chronic obstructive pulmonary disease, unspecified: Secondary | ICD-10-CM | POA: Diagnosis not present

## 2022-01-22 HISTORY — DX: Periapical abscess without sinus: K04.7

## 2022-01-22 MED ORDER — AMOXICILLIN-POT CLAVULANATE 875-125 MG PO TABS: 1.0000 | ORAL_TABLET | Freq: Two times a day (BID) | ORAL | 0 refills | Status: DC

## 2022-01-22 MED ORDER — CHLORHEXIDINE GLUCONATE 0.12 % MT SOLN: 15.0000 mL | Freq: Two times a day (BID) | OROMUCOSAL | 0 refills | Status: DC

## 2022-01-22 MED ORDER — CHLORTHALIDONE 50 MG PO TABS: 50.0000 mg | ORAL_TABLET | Freq: Every day | ORAL | 1 refills | Status: DC

## 2022-01-22 NOTE — Assessment & Plan Note (Signed)
Referral to clinical social work made for smoking cessation counseling

## 2022-01-22 NOTE — Telephone Encounter (Signed)
This patient missed your call she tried to call you back could not get a return call please call her again for smoking cessation counseling

## 2022-01-22 NOTE — Assessment & Plan Note (Signed)
Dental abscess seen left upper jaw  Plan to refer to dentistry  10-day course of Augmentin twice daily given

## 2022-01-22 NOTE — Assessment & Plan Note (Signed)
Airways improved with medication

## 2022-01-22 NOTE — Patient Instructions (Signed)
The least expensive tooth extraction we can offer you is urgent tooth, see resource sheet we gave you  Augmentin 1 twice daily for 10 days sent to [facility] for your dental infection  Start chlorthalidone 50 mg daily and take in addition to valsartan and amlodipine for blood pressure  Return in 1 month to see our clinical pharmacist for your blood pressure and then see Dr. Joya Gaskins in December  Ms. Nevada Crane will be asked to try to call you again for counseling regarding your smoking

## 2022-01-30 NOTE — Telephone Encounter (Signed)
LCSWA called patient today to assess patients' mental health needs. Patient did not answer the phone. Patient returned called once before but LCSWA was with a patient. LCSWA has called her back again since then and was able to leave a brief message with the patient asking them to return the call.

## 2022-02-04 ENCOUNTER — Telehealth: Payer: Self-pay | Admitting: Critical Care Medicine

## 2022-02-04 DIAGNOSIS — R911 Solitary pulmonary nodule: Secondary | ICD-10-CM

## 2022-02-04 NOTE — Telephone Encounter (Signed)
Reason for CRM: [facility] called saying patient is too young to be referred for lung cancer screening .  They cancelled appt .  They can schd for a chest ct without ,        Callers name Pansy

## 2022-02-04 NOTE — Telephone Encounter (Signed)
FYI

## 2022-02-04 NOTE — Addendum Note (Signed)
Addended by: Elsie Stain on: 02/04/2022 06:25 PM   Modules accepted: Orders

## 2022-02-04 NOTE — Telephone Encounter (Signed)
I do not agree with this and I will order a non contrast CT chest due to pt lung disease and heavy long term smoking hx

## 2022-02-05 ENCOUNTER — Other Ambulatory Visit: Payer: Commercial Managed Care - HMO

## 2022-02-07 NOTE — Telephone Encounter (Signed)
Called and left voice mail

## 2022-02-11 ENCOUNTER — Encounter (HOSPITAL_COMMUNITY): Payer: Self-pay

## 2022-02-11 ENCOUNTER — Emergency Department (HOSPITAL_COMMUNITY)
Admission: EM | Admit: 2022-02-11 | Discharge: 2022-02-11 | Discharge status: Against Medical Advice | Payer: Commercial Managed Care - HMO | Attending: Emergency Medicine | Admitting: Emergency Medicine

## 2022-02-11 ENCOUNTER — Emergency Department (HOSPITAL_COMMUNITY): Payer: Commercial Managed Care - HMO

## 2022-02-11 DIAGNOSIS — Z91040 Latex allergy status: Secondary | ICD-10-CM | POA: Diagnosis not present

## 2022-02-11 DIAGNOSIS — Z79899 Other long term (current) drug therapy: Secondary | ICD-10-CM | POA: Diagnosis not present

## 2022-02-11 DIAGNOSIS — I10 Essential (primary) hypertension: Secondary | ICD-10-CM | POA: Diagnosis not present

## 2022-02-11 DIAGNOSIS — J441 Chronic obstructive pulmonary disease with (acute) exacerbation: Secondary | ICD-10-CM

## 2022-02-11 DIAGNOSIS — Z7951 Long term (current) use of inhaled steroids: Secondary | ICD-10-CM | POA: Diagnosis not present

## 2022-02-11 DIAGNOSIS — R0602 Shortness of breath: Secondary | ICD-10-CM | POA: Diagnosis present

## 2022-02-11 LAB — CBC WITH DIFFERENTIAL/PLATELET
Abs Immature Granulocytes: 0.04 10*3/uL (ref 0.00–0.07)
Basophils Absolute: 0.1 10*3/uL (ref 0.0–0.1)
Basophils Relative: 1 %
Eosinophils Absolute: 0.3 10*3/uL (ref 0.0–0.5)
Eosinophils Relative: 2 %
HCT: 40.6 % (ref 36.0–46.0)
Hemoglobin: 13.8 g/dL (ref 12.0–15.0)
Immature Granulocytes: 0 %
Lymphocytes Relative: 23 %
Lymphs Abs: 2.4 10*3/uL (ref 0.7–4.0)
MCH: 33.1 pg (ref 26.0–34.0)
MCHC: 34 g/dL (ref 30.0–36.0)
MCV: 97.4 fL (ref 80.0–100.0)
Monocytes Absolute: 0.9 10*3/uL (ref 0.1–1.0)
Monocytes Relative: 9 %
Neutro Abs: 6.6 10*3/uL (ref 1.7–7.7)
Neutrophils Relative %: 65 %
Platelets: 331 10*3/uL (ref 150–400)
RBC: 4.17 MIL/uL (ref 3.87–5.11)
RDW: 11.9 % (ref 11.5–15.5)
WBC: 10.2 10*3/uL (ref 4.0–10.5)
nRBC: 0 % (ref 0.0–0.2)

## 2022-02-11 LAB — BASIC METABOLIC PANEL
Anion gap: 10 (ref 5–15)
BUN: 15 mg/dL (ref 6–20)
CO2: 25 mmol/L (ref 22–32)
Calcium: 9.4 mg/dL (ref 8.9–10.3)
Chloride: 99 mmol/L (ref 98–111)
Creatinine, Ser: 0.92 mg/dL (ref 0.44–1.00)
GFR, Estimated: >60 mL/min (ref >60)
Glucose, Bld: 114 mg/dL — ABNORMAL HIGH (ref 70–99)
Potassium: 3 mmol/L — ABNORMAL LOW (ref 3.5–5.1)
Sodium: 134 mmol/L — ABNORMAL LOW (ref 135–145)

## 2022-02-11 LAB — I-STAT BETA HCG BLOOD, ED (MC, WL, AP ONLY): I-stat hCG, quantitative: <5 m[IU]/mL (ref <5)

## 2022-02-11 MED ORDER — IPRATROPIUM-ALBUTEROL 0.5-2.5 (3) MG/3ML IN SOLN: 15.0000 mL | Freq: Once | RESPIRATORY_TRACT | Status: DC

## 2022-02-11 MED ORDER — ALBUTEROL SULFATE (2.5 MG/3ML) 0.083% IN NEBU
10.0000 mg/h | INHALATION_SOLUTION | RESPIRATORY_TRACT | Status: DC
  Administered 2022-02-11: 10 mg/h via RESPIRATORY_TRACT
  Filled 2022-02-11: qty 12

## 2022-02-11 MED ORDER — IPRATROPIUM-ALBUTEROL 0.5-2.5 (3) MG/3ML IN SOLN: 3.0000 mL | Freq: Once | RESPIRATORY_TRACT | Status: DC

## 2022-02-11 MED ORDER — POTASSIUM CHLORIDE CRYS ER 20 MEQ PO TBCR: 40.0000 meq | EXTENDED_RELEASE_TABLET | Freq: Once | ORAL | Status: DC

## 2022-02-11 MED ORDER — IPRATROPIUM-ALBUTEROL 0.5-2.5 (3) MG/3ML IN SOLN
3.0000 mL | Freq: Once | RESPIRATORY_TRACT | Status: AC
  Administered 2022-02-11: 3 mL via RESPIRATORY_TRACT
  Filled 2022-02-11: qty 3

## 2022-02-11 MED ORDER — MAGNESIUM SULFATE 2 GM/50ML IV SOLN
2.0000 g | Freq: Once | INTRAVENOUS | Status: AC
  Administered 2022-02-11: 2 g via INTRAVENOUS
  Filled 2022-02-11: qty 50

## 2022-02-11 NOTE — ED Triage Notes (Signed)
Pt BIBA from urgent care for [facility] x3-4 days. Hx COPD, has had episodes like this but not for a long time. Been taking nyquil and using inhaler with no relief. 20ga LH, 7.5 albuterol, 2.5 atrovent, '125mg'$  solumedrol.   122/70 Hr 88 93% RA

## 2022-02-11 NOTE — ED Provider Notes (Signed)
[facility] DEPT Provider Note   CSN: [id number] Arrival date & time: 02/11/22  1248     History  Chief Complaint  Patient presents with   Shortness of Breath   HPI Karen Pham is a 46 y.o. female with COPD, hypertension Hyperlipidemia presenting for shortness of breath.  Started Saturday but worsened yesterday.  Shortness of breath is worse with exertion or exertion intermittent chest pain worse with breathing.  Used her albuterol inhaler 5-6 times this morning and NyQuil overnight but her symptoms just progressively worsened prompting her to be evaluated in urgent care.  Was seen in urgent care this morning and received 7.5 albuterol, 2.5 Atrovent and, on 125 mg Solu-Medrol.  Patient states that she has had a mild cough, nonproductive.  No fever.  Patient states that she feels like mucus is "stuck in her throat".   Shortness of Breath      Home Medications Prior to Admission medications   Medication Sig Start Date End Date Taking? Authorizing Provider  albuterol (VENTOLIN HFA) 108 (90 Base) MCG/ACT inhaler Inhale 2 puffs into the lungs 3 (three) times daily as needed. 12/27/21   Elsie Stain, MD  amLODipine (NORVASC) 10 MG tablet Take 1 tablet (10 mg total) by mouth daily. 12/27/21   Elsie Stain, MD  amoxicillin-clavulanate (AUGMENTIN) 875-125 MG tablet Take 1 tablet by mouth 2 (two) times daily. 01/22/22   Elsie Stain, MD  atorvastatin (LIPITOR) 40 MG tablet Take 1 tablet (40 mg total) by mouth daily. 12/27/21   Elsie Stain, MD  benzonatate (TESSALON) 100 MG capsule Take 1 capsule (100 mg total) by mouth 2 (two) times daily as needed for cough. 12/27/21   Elsie Stain, MD  chlorhexidine (PERIDEX) 0.12 % solution Use as directed 15 mLs in the mouth or throat 2 (two) times daily. 01/22/22   Elsie Stain, MD  chlorthalidone (HYGROTON) 50 MG tablet Take 1 tablet (50 mg total) by mouth daily. 01/22/22   Elsie Stain, MD   fluticasone-salmeterol (ADVAIR) 250-50 MCG/ACT AEPB Inhale 1 puff into the lungs in the morning and at bedtime. 12/27/21   Elsie Stain, MD  gabapentin (NEURONTIN) 100 MG capsule Take 1 capsule (100 mg total) by mouth 3 (three) times daily. 1 in AM and 2 at bedtime 12/27/21   Elsie Stain, MD  predniSONE (DELTASONE) 10 MG tablet Take 4 tablets daily for 5 days then stop 12/27/21   Elsie Stain, MD  sertraline (ZOLOFT) 50 MG tablet Take 1 tablet (50 mg total) by mouth daily. 12/27/21   Elsie Stain, MD  valsartan (DIOVAN) 320 MG tablet Take 1 tablet (320 mg total) by mouth daily. 12/27/21   Elsie Stain, MD      Allergies    Latex and Sulfa antibiotics    Review of Systems   Review of Systems  Respiratory:  Positive for shortness of breath.     Physical Exam Updated Vital Signs BP 117/86   Pulse 95   Temp (!) 97.5 F (36.4 C) (Oral)   Resp 19   Ht '5\' 5"'$  (1.651 m)   Wt 68 kg   SpO2 99%   BMI 24.96 kg/m  Physical Exam Vitals and nursing note reviewed.  Constitutional:      Appearance: She is ill-appearing. She is not toxic-appearing.  HENT:     Head: Normocephalic and atraumatic.     Mouth/Throat:     Mouth: Mucous membranes are moist.  Eyes:     General:        Right eye: No discharge.        Left eye: No discharge.     Conjunctiva/sclera: Conjunctivae normal.  Cardiovascular:     Rate and Rhythm: Normal rate and regular rhythm.     Pulses: Normal pulses.     Heart sounds: Normal heart sounds.  Pulmonary:     Effort: Pulmonary effort is normal. Tachypnea present.     Breath sounds: Wheezing and rhonchi present. No rales.  Abdominal:     General: Abdomen is flat.     Palpations: Abdomen is soft.  Skin:    General: Skin is warm and dry.     Comments: pale  Neurological:     General: No focal deficit present.     Mental Status: She is alert.  Psychiatric:        Mood and Affect: Mood normal.     ED Results / Procedures / Treatments    Labs (all labs ordered are listed, but only abnormal results are displayed) Labs Reviewed  BASIC METABOLIC PANEL - Abnormal; Notable for the following components:      Result Value   Sodium 134 (*)    Potassium 3.0 (*)    Glucose, Bld 114 (*)    All other components within normal limits  CBC WITH DIFFERENTIAL/PLATELET  I-STAT BETA HCG BLOOD, ED ([facility], [facility], [facility] ONLY)    EKG EKG Interpretation  Date/Time:  Monday February 11 2022 13:08:53 EDT Ventricular Rate:  83 PR Interval:  131 QRS Duration: 94 QT Interval:  379 QTC Calculation: 446 R Axis:   -14 Text Interpretation: Sinus rhythm No significant change since last tracing Confirmed by Deno Etienne [phone]) on 02/11/2022 3:04:22 PM  Radiology DG Chest Port 1 View  Result Date: 02/11/2022 CLINICAL DATA:  Shortness of breath. EXAM: PORTABLE CHEST 1 VIEW COMPARISON:  Chest radiograph November 03, 2015 FINDINGS: Stable cardiac and mediastinal contours. No consolidative pulmonary opacities. No pleural effusion or pneumothorax. Monitoring leads overlie the patient. External devices overlie the left upper hemithorax. IMPRESSION: No active disease. Electronically Signed   By: Lovey Newcomer M.D.   On: 02/11/2022 14:17    Procedures Procedures    Medications Ordered in ED Medications  albuterol (PROVENTIL) (2.5 MG/3ML) 0.083% nebulizer solution (10 mg/hr Nebulization New Bag/Given 02/11/22 1340)  magnesium sulfate IVPB 2 g 50 mL (0 g Intravenous Stopped 02/11/22 1427)  ipratropium-albuterol (DUONEB) 0.5-2.5 (3) MG/3ML nebulizer solution 3 mL (3 mLs Nebulization Given 02/11/22 1340)    ED Course/ Medical Decision Making/ A&P Clinical Course as of 02/11/22 1620  Mon Feb 11, 2022  1618 Pulse Rate: 82 [JR]    Clinical Course User Index [JR] Harriet Pho, PA-C                           Medical Decision Making Amount and/or Complexity of Data Reviewed Labs: ordered. Radiology: ordered.  Risk Prescription drug management.   This patient  presents to the ED for concern of shortness of breath, this involves a number of treatment options, and is a complaint that carries with it a high risk of complications and morbidity.  The differential diagnosis includes COPD exacerbation, ACS, PE, and aortic dissection.   Co morbidities: Discussed in HPI   EMR reviewed including pt PMHx, past surgical history and past visits to ER.   See HPI for more details   Lab Tests:  I ordered and independently interpreted labs. Labs notable for hypokalemia   Imaging Studies:  NAD. I personally reviewed all imaging studies and no acute abnormality found. I agree with radiology interpretation.    Cardiac Monitoring:  The patient was maintained on a cardiac monitor.  I personally viewed and interpreted the cardiac monitored which showed an underlying rhythm of:  EKG non-ischemic   Medicines ordered:  I ordered medication including DuoNeb and IV mag for COPD exacerbation Reevaluation of the patient after these medicines showed that the patient improved I have reviewed the patients home medicines and have made adjustments as needed   Consults/Attending Physician   I discussed this case with my attending physician who cosigned this note including patient's presenting symptoms, physical exam, and planned diagnostics and interventions. Attending physician stated agreement with plan or made changes to plan which were implemented.   Reevaluation:  After the interventions noted above I re-evaluated patient and found that they have :improved.  Upon reevaluation patient stated that her work of breathing is much improved.  On auscultation, she was still rhonchorous with notable but improved expiratory wheezing diffusely.  Patient no longer hypoxic with improved tachypnea.  Mention to patient that at like her to stay for observation but she strongly expressed desire to leave to the nurse and subsequently eloped.    Problem List / ED  Course: Patient presented for shortness of breath.  On exam she was rhonchorous and wheezing tachypneic and mildly hypoxic.  Patient has a history of COPD patient was seen in urgent care and stated that after treated with albuterol and Solu-Medrol her symptoms improved.  Constellation of symptoms are likely related to COPD exacerbation.  Treated with DuoNeb and IV mag.  Upon reevaluation she states that she felt much better.  On auscultation rhonchi and wheezing had improved.  She was less tachypneic.  I had hoped that she would stay a bit longer for observation.  But patient expressed strong desire to leave and subsequently eloped.  Dispostion:  Patient eloped.         Final Clinical Impression(s) / ED Diagnoses Final diagnoses:  COPD exacerbation [facility])    Rx / DC Orders ED Discharge Orders     None         Harriet Pho, PA-C 02/11/22 [place], [place], DO 02/11/22 1634

## 2022-02-11 NOTE — ED Notes (Signed)
Pt ambulatory to bathroom w/o assist. Steady gait. No LOB

## 2022-02-11 NOTE — ED Notes (Addendum)
Patient left prior to discharge because they said they had to get to their ride home. Unable to obtain a temp before she left.

## 2022-02-11 NOTE — Progress Notes (Signed)
Patient placed on 1 Hr CAT . Patient complains of tight feeling in chest and cant breath deep. RT will continue to monitor

## 2022-02-12 ENCOUNTER — Other Ambulatory Visit: Payer: Self-pay

## 2022-02-12 ENCOUNTER — Ambulatory Visit: Payer: Self-pay

## 2022-02-12 ENCOUNTER — Other Ambulatory Visit: Payer: Self-pay | Admitting: Critical Care Medicine

## 2022-02-12 MED ORDER — PREDNISONE 10 MG PO TABS: ORAL_TABLET | ORAL | 0 refills | Status: DC

## 2022-02-12 NOTE — Telephone Encounter (Signed)
  Chief Complaint: breathing difficulty Symptoms: wheezing, severe coughing, speaking in phrases, nasal congestion, runny nose Frequency: Friday Pertinent Negatives: Patient denies dizziness, chest pain, fever Disposition: '[]'$ ED /'[]'$ Urgent Care (no appt availability in office) / '[]'$ Appointment(In office/virtual)/ '[]'$  [facility]/ '[]'$ Home Care/ '[x]'$ Refused Recommended Disposition /'[]'$ [place] [facility]/ '[]'$  Follow-up with PCP Additional Notes: refused ED- Pt did agree to call EMS for evaluation at her home. Pt requesting nebulizer medication. Pt requesting PCP triage.  Reason for Disposition  [1] MODERATE difficulty breathing (e.g., speaks in phrases, SOB even at rest, pulse 100-120) AND [2] NEW-onset or WORSE than normal  Answer Assessment - Initial Assessment Questions 1. RESPIRATORY STATUS: "Describe your breathing?" (e.g., wheezing, shortness of breath, unable to speak, severe coughing)      Severe coughing 2. ONSET: "When did this breathing problem begin?"      Friday 3. PATTERN "Does the difficult breathing come and go, or has it been constant since it started?"      constant 4. SEVERITY: "How bad is your breathing?" (e.g., mild, moderate, severe)    - MILD: No SOB at rest, mild SOB with walking, speaks normally in sentences, can lie down, no retractions, pulse < 100.    - MODERATE: SOB at rest, SOB with minimal exertion and prefers to sit, cannot lie down flat, speaks in phrases, mild retractions, audible wheezing, pulse 100-120.    - SEVERE: Very SOB at rest, speaks in single words, struggling to breathe, sitting hunched forward, retractions, pulse > 120      moderate 5. RECURRENT SYMPTOM: "Have you had difficulty breathing before?" If Yes, ask: "When was the last time?" and "What happened that time?"      COPD 6. CARDIAC HISTORY: "Do you have any history of heart disease?" (e.g., heart attack, angina, bypass surgery, angioplasty)      no 7. LUNG HISTORY: "Do you have any  history of lung disease?"  (e.g., pulmonary embolus, asthma, emphysema)     COPD 8. CAUSE: "What do you think is causing the breathing problem?"      Cold sx 9. OTHER SYMPTOMS: "Do you have any other symptoms? (e.g., dizziness, runny nose, cough, chest pain, fever)     Runny nose, harsh cough, wheezing (inspiratory and expiratory wheezes) 10. O2 SATURATION MONITOR:  "Do you use an oxygen saturation monitor (pulse oximeter) at home?" If Yes, ask: "What is your reading (oxygen level) today?" "What is your usual oxygen saturation reading?" (e.g., 95%)       N/a 11. PREGNANCY: "Is there any chance you are pregnant?" "When was your last menstrual period?"       N/a 12. TRAVEL: "Have you traveled out of the country in the last month?" (e.g., travel history, exposures)       N/a  Protocols used: Breathing Difficulty-A-AH

## 2022-02-12 NOTE — Telephone Encounter (Signed)
The pt should go back to ED  she was in ED this weekend and eloped before the provider could rx necessary Rx   I am not comfortable with phone medicine here

## 2022-02-13 ENCOUNTER — Telehealth: Payer: Self-pay | Admitting: Emergency Medicine

## 2022-02-13 ENCOUNTER — Other Ambulatory Visit: Payer: Self-pay | Admitting: Critical Care Medicine

## 2022-02-13 NOTE — Telephone Encounter (Signed)
Copied from [place] [id number]. Topic: General - Other >> Feb 12, 2022  4:24 PM Leitha Schuller wrote: Pt requesting a cb regarding rx request, please reference 10-10 NT and fu w/ pt

## 2022-02-13 NOTE — Telephone Encounter (Signed)
Patient evaluated in ED on 02/11/2022.

## 2022-02-13 NOTE — Telephone Encounter (Signed)
Attempt to call patient back with message per Dr. Joya Gaskins: Msg from previous encounter:  Note The pt should go back to ED  she was in ED this weekend and eloped before the provider could rx necessary Rx   I am not comfortable with phone medicine here      Please relay message to patient when she calls.

## 2022-02-15 ENCOUNTER — Ambulatory Visit: Payer: Self-pay | Admitting: *Deleted

## 2022-02-15 MED ORDER — BENZONATATE 100 MG PO CAPS: 100.0000 mg | ORAL_CAPSULE | Freq: Two times a day (BID) | ORAL | 0 refills | Status: DC | PRN

## 2022-02-15 NOTE — Telephone Encounter (Signed)
Pt is calling to report that she has been on predniSONE (DELTASONE) 10 MG tablet [[id number]] for 4 days and is not feeling better. And extreme congestion on the benzonatate (TESSALON) 100 MG capsule [[id number]] .   Chief Complaint: SOB Symptoms: SOB with minimal exertion, cough, greenish Frequency: ED 109/23 1 day left of prednisone. CXR  neg at that time. Pertinent Negatives: Patient denies  Disposition: '[]'$ ED /'[x]'$ Urgent Care (no appt availability in office) / '[]'$ Appointment(In office/virtual)/ '[]'$  [facility]/ '[]'$ Home Care/ '[]'$ Refused Recommended Disposition /'[]'$ [facility]/ '[]'$  Follow-up with PCP Additional Notes: Advised UC. States will follow disposition. Also reports "I have nebulizer but never had any solution, that might help." Not on current med profile. Care advise provided. Please advise.   Reason for Disposition  [1] MILD difficulty breathing (e.g., minimal/no SOB at rest, SOB with walking, pulse <100) AND [2] NEW-onset or WORSE than normal  Answer Assessment - Initial Assessment Questions 1. RESPIRATORY STATUS: "Describe your breathing?" (e.g., wheezing, shortness of breath, unable to speak, severe coughing)      SOB with minimal exertion 2. ONSET: "When did this breathing problem begin?"      Week ago, ED 02/11/22 3. PATTERN "Does the difficult breathing come and go, or has it been constant since it started?"      Constant 4. SEVERITY: "How bad is your breathing?" (e.g., mild, moderate, severe)    - MILD: No SOB at rest, mild SOB with walking, speaks normally in sentences, can lie down, no retractions, pulse < 100.    - MODERATE: SOB at rest, SOB with minimal exertion and prefers to sit, cannot lie down flat, speaks in phrases, mild retractions, audible wheezing, pulse 100-120.    - SEVERE: Very SOB at rest, speaks in single words, struggling to breathe, sitting hunched forward, retractions, pulse > 120      Moderate 5. RECURRENT SYMPTOM: "Have you had  difficulty breathing before?" If Yes, ask: "When was the last time?" and "What happened that time?"      Yes  7. LUNG HISTORY: "Do you have any history of lung disease?"  (e.g., pulmonary embolus, asthma, emphysema)     COPD 8. CAUSE: "What do you think is causing the breathing problem?"      COPD 9. OTHER SYMPTOMS: "Do you have any other symptoms? (e.g., dizziness, runny nose, cough, chest pain, fever)     Fatigued, cough, yellow greenish 10. O2 SATURATION MONITOR:  "Do you use an oxygen saturation monitor (pulse oximeter) at home?" If Yes, ask: "What is your reading (oxygen level) today?" "What is your usual oxygen saturation reading?" (e.g., 95%)       NA  Protocols used: Breathing Difficulty-A-AH

## 2022-02-21 DIAGNOSIS — N6489 Other specified disorders of breast: Secondary | ICD-10-CM | POA: Insufficient documentation

## 2022-02-22 ENCOUNTER — Other Ambulatory Visit: Payer: Self-pay | Admitting: General Surgery

## 2022-02-22 DIAGNOSIS — N6489 Other specified disorders of breast: Secondary | ICD-10-CM

## 2022-02-25 ENCOUNTER — Telehealth: Payer: Self-pay | Admitting: Critical Care Medicine

## 2022-02-25 NOTE — Telephone Encounter (Signed)
Pt is calling to reschedule appt with [facility] for tomorrow. [place]- [phone]

## 2022-02-25 NOTE — Telephone Encounter (Signed)
Patient called and rescheduled

## 2022-02-26 ENCOUNTER — Ambulatory Visit: Payer: Commercial Managed Care - HMO | Admitting: Pharmacist

## 2022-02-28 ENCOUNTER — Other Ambulatory Visit: Payer: Self-pay | Admitting: General Surgery

## 2022-02-28 DIAGNOSIS — N6489 Other specified disorders of breast: Secondary | ICD-10-CM

## 2022-03-25 ENCOUNTER — Ambulatory Visit: Payer: Self-pay | Admitting: *Deleted

## 2022-03-25 NOTE — Telephone Encounter (Signed)
Message from Erick Blinks sent at 03/25/2022 12:07 PM EST  Summary: Swelling/Tooth Pain   Pt called back to request more Amoxicillin because her tooth is swollen and irritated with no improvement. Please advise  Best contact: [phone]  "I just need some help, my tooth is swollen/irritated. I work until 9:30 and will be unable to answer"          Call History   Type Contact Phone/Fax User  03/25/2022 12:04 PM EST Phone ([address]) [place], [place] (Self) [phone] Jerilynn Mages) Marlan Palau    Reason for Disposition  Toothache present > 24 hours    Was given a list of dentists from Dr. Asencion Noble.   Still working on trying to get in to be seen.    Requesting another round of the Amoxicillin to help get her by.  Answer Assessment - Initial Assessment Questions 1. LOCATION: "Which tooth is hurting?"  (e.g., right-side/left-side, upper/lower, front/back)    I returned her call.   "It's the same tooth he saw me about before".    "It's the top left tooth in the front next to my front tooth".   It's flared up, swollen and hurting.  Dr. Joya Gaskins gave me a list of dentists but I haven't been able to afford to get anything worked out yet.   The clinics are expensive.   I'm working on trying to get in to see a dentist.   Would Dr. Joya Gaskins be willing to call in another round of Amoxicillin for me?   2. ONSET: "When did the toothache start?"  (e.g., hours, days)      It's the same tooth he saw me for and prescribed the Amoxicillin and gave me a list of dentists. 3. SEVERITY: "How bad is the toothache?"  (Scale 1-10; mild, moderate or severe)   - MILD (1-3): doesn't interfere with chewing    - MODERATE (4-7): interferes with chewing, interferes with normal activities, awakens from sleep     - SEVERE (8-10): unable to eat, unable to do any normal activities, excruciating pain        Moderate 4. SWELLING: "Is there any visible swelling of your face?"     It's irritated around the tooth 5. OTHER  SYMPTOMS: "Do you have any other symptoms?" (e.g., fever)     Swollen and irritated around the tooth 6. PREGNANCY: "Is there any chance you are pregnant?" "When was your last menstrual period?"     Not asked  Protocols used: Toothache-A-AH

## 2022-03-25 NOTE — Telephone Encounter (Signed)
Called patient- Will advise of mobile unit location.  Left a brief message on the voicemail advising of mobile location today.

## 2022-03-25 NOTE — Telephone Encounter (Signed)
  Chief Complaint: Top left tooth next to front tooth is irritated and swollen, painful.  Requesting another round of the Amoxicillin from Dr. Joya Gaskins until she can get in to see one of the dentists Dr. Joya Gaskins gave her a list for. Symptoms: above Frequency: Flared up again. Pertinent Negatives: Patient denies seeing a dentist yet.    "I'm working on it". Disposition: '[]'$ ED /'[]'$ Urgent Care (no appt availability in office) / '[]'$ Appointment(In office/virtual)/ '[]'$  [facility]/ '[]'$ Home Care/ '[]'$ Refused Recommended Disposition /'[]'$ [place] [facility]/ '[x]'$  Follow-up with PCP Additional Notes: I have sent a message to Dr. Asencion Noble.   Someone will let you know what he advises.

## 2022-04-03 NOTE — Progress Notes (Unsigned)
S:     No chief complaint on file.  46 y.o. female who presents for hypertension evaluation, education, and management.   PMH is significant for HTN, COPD/asthma, HLD, Diabetes.  Patient was referred and last seen by Primary Care Provider, Dr. Joya Gaskins, on 01/22/22.   At last visit, She is currently taking Lisinopril-HCTZ and amlodipine for her hypertension. She says she does not take the Lisinopril-HCTZ on days where she works because she says she does not like that it makes her urinate so frequently. Her BP remains slightly elevated today at 147/88. She is currently living in sober living housing. She says her housing is safe and secure for now, but she would eventually like her own place. 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 She is not using any other illicit substances right now. She is still smoking, but she has decreased her smoking from just over 1 PPD to 1/2 PPD. She says she thinks this has helped her breathing. She also uses prescribed inhalers. She says her mood is stable right now, and she follows with [name], NP for behavioral health. tobacco use smoking 1 to 2 packs of cigarettes every 2 to 3 days.  She has been smoking since the age of 26.  Patient notes increased wheezing shortness of breath.  On arrival blood pressure elevated 156/86.  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  She is trying to move into a different position.  Patient's had several cancer screenings performed.  She had a colonoscopy which showed tubular adenomas throughout the colon at least 5 different ones.  These have been removed.  Patient had a mammogram with fibrocystic disease seen she had a biopsy of her right breast 9 o'clock position which was benign however there is a concern that this may evolve into malignancy and she has a follow-up visit with general surgery. Patient also has yet  to achieve a lung scan low-dose to screen for lung cancer given her smoking history.  The patient has been taking the amlodipine 10 mg daily and the valsartan 160 mg daily.  Patient does not follow a healthy diet at times.  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  She stays at [facility].  She is accompanied by her advocate [name] who is a retired Software engineer. Dr Joya Gaskins started her on chlorthalidone 50 mg daily at last visit , BP was 148/87. This was on 9/19, labs from 10/9 K was 3.0, Na was 134  Today, patient arrives in *** spirits and presents without *** assistance. *** Denies dizziness, headache, blurred vision, swelling.   Patient reports hypertension was diagnosed in ***.   Family/Social history: ***  Medication adherence *** . Patient has *** taken BP medications today.   Current antihypertensives include: amlodipine 10 mg daily, chlorthalidone 50 mg daily, valsartan 320 mg daily  Antihypertensives tried in the past include: lisinopril, HCTZ  Reported home BP readings: ***  Patient reported dietary habits: Eats *** meals/day Breakfast: *** Lunch: *** Dinner: *** Snacks: *** Drinks: ***  Patient-reported exercise habits: ***  ASCVD risk factors include: ***  O:  ROS  Physical Exam  Last 3 Office BP readings: BP Readings from Last 3 Encounters:  02/11/22 117/86  01/22/22 (!) 148/87  12/27/21 (!) 156/86    BMET    Component Value  Date/Time   NA 134 (L) 02/11/2022 1313   NA 136 05/23/2021 1220   K 3.0 (L) 02/11/2022 1313   CL 99 02/11/2022 1313   CO2 25 02/11/2022 1313   GLUCOSE 114 (H) 02/11/2022 1313   BUN 15 02/11/2022 1313   BUN 13 05/23/2021 1220   CREATININE 0.92 02/11/2022 1313   CALCIUM 9.4 02/11/2022 1313   GFRNONAA >60 02/11/2022 1313   GFRAA >60 12/04/2015 2007    Renal function: CrCl cannot be calculated (Patient's most recent lab result is older than the maximum 21 days allowed.).  Clinical ASCVD:  {YES/NO:21197} The 10-year ASCVD risk score (Arnett DK, et al., 2019) is: 2.7%   Values used to calculate the score:     Age: 23 years     Sex: Female     Is Non-Hispanic African American: No     Diabetic: No     Tobacco smoker: Yes     Systolic Blood Pressure: 646 mmHg     Is BP treated: Yes     HDL Cholesterol: 49 mg/dL     Total Cholesterol: 153 mg/dL  Patient is participating in a Managed Medicaid Plan:  {MM YES/NO:27447::"Yes"}    A/P: Hypertension diagnosed *** currently *** on current medications. BP goal < 130/80 *** mmHg. Medication adherence appears ***. Control is suboptimal due to ***.  -{Meds adjust:18428} ***.  -Patient educated on purpose, proper use, and potential adverse effects of ***.  -F/u labs ordered - *** -Counseled on lifestyle modifications for blood pressure control including reduced dietary sodium, increased exercise, adequate sleep. -Encouraged patient to check BP at home and bring log of readings to next visit. Counseled on proper use of home BP cuff.    Results reviewed and written information provided.    Written patient instructions provided. Patient verbalized understanding of treatment plan.  Total time in face to face counseling *** minutes.    Follow-up:  Pharmacist ***. PCP clinic visit in ***.  Patient seen with ***.

## 2022-04-04 ENCOUNTER — Ambulatory Visit: Payer: Commercial Managed Care - HMO | Attending: Critical Care Medicine | Admitting: Pharmacist

## 2022-04-04 VITALS — BP 101/69 | HR 73 | Wt 136.2 lb

## 2022-04-04 DIAGNOSIS — I10 Essential (primary) hypertension: Secondary | ICD-10-CM | POA: Diagnosis not present

## 2022-04-04 MED ORDER — CHLORTHALIDONE 25 MG PO TABS: 12.5000 mg | ORAL_TABLET | Freq: Every day | ORAL | 1 refills | Status: DC

## 2022-04-05 LAB — CMP14+EGFR
ALT: 11 IU/L (ref 0–32)
AST: 18 IU/L (ref 0–40)
Albumin/Globulin Ratio: 2.1 (ref 1.2–2.2)
Albumin: 4.5 g/dL (ref 3.9–4.9)
Alkaline Phosphatase: 61 IU/L (ref 44–121)
BUN/Creatinine Ratio: 14 (ref 9–23)
BUN: 18 mg/dL (ref 6–24)
Bilirubin Total: 0.3 mg/dL (ref 0.0–1.2)
CO2: 23 mmol/L (ref 20–29)
Calcium: 9.8 mg/dL (ref 8.7–10.2)
Chloride: 102 mmol/L (ref 96–106)
Creatinine, Ser: 1.29 mg/dL — ABNORMAL HIGH (ref 0.57–1.00)
Globulin, Total: 2.1 g/dL (ref 1.5–4.5)
Glucose: 89 mg/dL (ref 70–99)
Potassium: 4.6 mmol/L (ref 3.5–5.2)
Sodium: 138 mmol/L (ref 134–144)
Total Protein: 6.6 g/dL (ref 6.0–8.5)
eGFR: 52 mL/min/{1.73_m2} — ABNORMAL LOW (ref >59)

## 2022-04-24 ENCOUNTER — Ambulatory Visit: Payer: Commercial Managed Care - HMO | Admitting: Critical Care Medicine

## 2022-05-08 ENCOUNTER — Other Ambulatory Visit: Payer: Self-pay | Admitting: Critical Care Medicine

## 2022-05-08 DIAGNOSIS — F418 Other specified anxiety disorders: Secondary | ICD-10-CM

## 2022-05-22 ENCOUNTER — Encounter (HOSPITAL_BASED_OUTPATIENT_CLINIC_OR_DEPARTMENT_OTHER): Payer: Self-pay | Admitting: General Surgery

## 2022-05-22 ENCOUNTER — Other Ambulatory Visit: Payer: Self-pay

## 2022-05-28 ENCOUNTER — Ambulatory Visit
Admission: RE | Admit: 2022-05-28 | Discharge: 2022-05-28 | Disposition: A | Discharge status: Home / Self Care | Payer: Commercial Managed Care - HMO | Source: Ambulatory Visit | Attending: General Surgery | Admitting: General Surgery

## 2022-05-28 DIAGNOSIS — N6489 Other specified disorders of breast: Secondary | ICD-10-CM

## 2022-05-28 HISTORY — PX: BREAST BIOPSY: SHX20

## 2022-05-28 NOTE — H&P (Signed)
REFERRING PHYSICIAN:  Asencion Noble, MD   PROVIDER:  Georgianne Fick, MD   Care Team: Patient Care Team: Elsie Stain, MD as PCP - General (Pulmonary Disease)    MRN: [id number] DOB: [birth year]    Subjective    Chief Complaint: New Consultation and Breast Problem       History of Present Illness: Karen Pham is a 47 y.o. female who is seen today as an office consultation at the request of Dr. Joya Gaskins for evaluation of New Consultation and Breast Problem .     Patient presented to her physician with a palpable right breast mass in June 2023.  She underwent diagnostic imaging.  There were several findings.  On the right, there was a 1.5 cm palpable mass at 9:00 4 cm from the nipple.  There was a 1 cm mass at 1:30 on the left.  There was also a single right axillary lymph node that appeared abnormal.  The left breast mass appeared to be a cyst.  The 9:00 breast mass was suspicious as well as the lymph node.  She underwent core needle biopsy of the suspicious lymph node on the right as well as the 9:00 mass on the right.  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  The lymph node was negative.  She is referred for excision of the 9:00 mass as this was discordant.   Family cancer history: She has no personal or family cancer history as of this point.  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       Diagnostic mammogram/us BCG 11/01/2021 EXAM:  DIGITAL DIAGNOSTIC BILATERAL MAMMOGRAM WITH TOMOSYNTHESIS AND CAD;  ULTRASOUND RIGHT BREAST LIMITED; ULTRASOUND LEFT BREAST LIMITED   TECHNIQUE:  Bilateral digital diagnostic mammography and breast tomosynthesis  was performed. The images were evaluated with computer-aided  detection.; Targeted ultrasound examination of the right breast was  performed; Targeted ultrasound examination of the left breast was  performed.   COMPARISON: None.  Baseline.   ACR Breast Density Category d: The breast tissue is extremely  dense,  which lowers the sensitivity of mammography.   FINDINGS:  There is an oval mass in the anterior upper outer left breast with  some circumscribed and some indistinct or obscured margins. No  abnormality is seen on the right.   On physical exam, there is an approximately 1.5 cm rounded palpable  mass in the 9 o'clock position of the right breast, 4 cm from the  nipple. There is also an approximately 1 cm rounded, circumscribed  palpable mass in the 1:30 o'clock retroareolar left breast. There  are no palpable right axillary lymph nodes.   Targeted ultrasound is performed, showing an 8 x 6 x 4 mm irregular,  hypoechoic mass in the 9 o'clock position of the right breast, 4 cm  from the nipple. This has some internal blood flow with power  Doppler.   There is a single right axillary lymph node with mild eccentric  cortical thickening measuring up to 3.5 mm in maximum thickness.  There are additional normal appearing right axillary lymph nodes  with cortexes measuring 1.5 mm or less.   IMPRESSION:  1. 8 mm mass in the 9 o'clock position of the right breast with  imaging features suspicious for malignancy.  2. Single right axillary lymph node with mild eccentric cortical  thickening, suspicious for a metastatic node.  3. Benign left breast cyst.   RECOMMENDATION:  Ultrasound-guided core needle biopsies of  the 8 mm mass in the 9  o'clock position of the right breast and the suspicious right  axillary lymph node. This has been discussed with the patient and  scheduled at 7:30 a.m. on 11/12/2021.   I have discussed the findings and recommendations with the patient.  If applicable, a reminder letter will be sent to the patient  regarding the next appointment.   BI-RADS CATEGORY  4: Suspicious.    Pathology core needle biopsy: 11/27/2021 1. Breast, right, needle core biopsy, outer, 9 o'clock, 4cmfn, ribbon clip COMPLEX SCLEROSING LESION ADJACENT FIBROMATOID NODULE AND  FIBROCYSTIC CHANGES INCLUDING STROMAL FIBROSIS, ADENOSIS AND USUAL DUCT HYPERPLASIA FOCAL PSEUDOANGIOMATOUS STROMAL HYPERPLASIA (PASH) NEGATIVE FOR MICROCALCIFICATIONS NEGATIVE FOR CARCINOMA 2. Lymph node, needle/core biopsy, right axilla, hydromark spiral clip BENIGN FIBROADIPOSE AND FIBROMUSCULAR STROMA NEGATIVE FOR LYMPH NODE NEGATIVE FOR MALIGNANCY       Review of Systems: A complete review of systems was obtained from the patient.  I have reviewed this information and discussed as appropriate with the patient.  See HPI as well for other ROS.     Medical History: Past Medical History      Past Medical History:  Diagnosis Date   Anemia     Anxiety     Asthma, unspecified asthma severity, unspecified whether complicated, unspecified whether persistent     COPD (chronic obstructive pulmonary disease) (CMS-HCC)     Hyperlipidemia     Hypertension     Substance abuse (CMS-HCC)             Patient Active Problem List  Diagnosis   Complex sclerosing lesion of right breast      Past Surgical History       Past Surgical History:  Procedure Laterality Date   carpol tunnel            Allergies      Allergies  Allergen Reactions   Sulfa (Sulfonamide Antibiotics) Hives              Current Outpatient Medications on File Prior to Visit  Medication Sig Dispense Refill   albuterol 90 mcg/actuation inhaler Inhale into the lungs       amLODIPine (NORVASC) 10 MG tablet Take 1 tablet by mouth once daily       atorvastatin (LIPITOR) 40 MG tablet Take 40 mg by mouth once daily       benzonatate (TESSALON) 100 MG capsule Take by mouth       valsartan (DIOVAN) 320 MG tablet Take by mouth       sertraline (ZOLOFT) 50 MG tablet Take 50 mg by mouth once daily        No current facility-administered medications on file prior to visit.      Family History       Family History  Problem Relation Age of Onset   Stroke Mother     Coronary Artery Disease (Blocked arteries  around heart) Father     High blood pressure (Hypertension) Father          Social History        Tobacco Use  Smoking Status Every Day   Packs/day: .5   Types: Cigarettes  Smokeless Tobacco Never      Social History  Social History         Socioeconomic History   Marital status: Single  Tobacco Use   Smoking status: Every Day      Packs/day: .5      Types: Cigarettes   Smokeless  tobacco: Never  Substance and Sexual Activity   Alcohol use: Not Currently   Drug use: Not Currently        Objective:         Vitals:      BP: 118/80  Pulse: 62  Temp: 36.7 C (98 F)  SpO2: (!) 88%  Weight: 64 kg (141 lb)  Height: 167.6 cm ('5\' 6"'$ )    Body mass index is 22.76 kg/m.   Gen:  No acute distress.  Well nourished and well groomed.   Neurological: Alert and oriented to person, place, and time. Coordination normal.  Head: Normocephalic and atraumatic.  Eyes: Conjunctivae are normal. Pupils are equal, round, and reactive to light. No scleral icterus.  Neck: Normal range of motion. Neck supple. No tracheal deviation or thyromegaly present.  Cardiovascular: Normal rate, regular rhythm, normal heart sounds and intact distal pulses.  Exam reveals no gallop and no friction rub.  No murmur heard. Breast: small palpable mass on right.  No skin dimpling.  No LAD.  No nipple retraction.   Respiratory: Effort normal.  No respiratory distress. No chest wall tenderness. Breath sounds normal.  No wheezes, rales or rhonchi.  GI: Soft. Bowel sounds are normal. The abdomen is soft and nontender.  There is no rebound and no guarding.  Musculoskeletal: Normal range of motion. Extremities are nontender.  Lymphadenopathy: No cervical, preauricular, postauricular or axillary adenopathy is present Skin: Skin is warm and dry. No rash noted. No diaphoresis. No erythema. No pallor. No clubbing, cyanosis, or edema.   Psychiatric: Normal mood and affect. Behavior is normal. Judgment and thought  content normal.      Labs CBC normal 02/11/2022 BMET with low K at 3.0   Assessment and Plan:        ICD-10-CM    1. Complex sclerosing lesion of right breast  N64.89         Patient will need excision of this lesion.  It is a complex sclerosing lesion in addition to being discordant.  I discussed a seed localized excisional biopsy with the patient.  I reviewed the process by which the seed would be placed.  I discussed incision.  I reviewed risks of the surgery including bleeding, infection, pain, change in breast contour, dissatisfaction with scar, possible need for additional procedures or surgeries, possible heart or lung complications.  Advised her that she would have a lifting restriction for approximately a week.  I advised that she would not be able to shower for 2 days and no swimming for 2 weeks.  Patient understands and wishes to proceed.   No follow-ups on file.

## 2022-05-28 NOTE — Progress Notes (Signed)

## 2022-05-29 ENCOUNTER — Ambulatory Visit (HOSPITAL_COMMUNITY)
Admission: RE | Admit: 2022-05-29 | Discharge: 2022-05-29 | Disposition: A | Discharge status: Home / Self Care | Payer: Commercial Managed Care - HMO | Attending: General Surgery | Admitting: General Surgery

## 2022-05-29 ENCOUNTER — Encounter (HOSPITAL_BASED_OUTPATIENT_CLINIC_OR_DEPARTMENT_OTHER): Payer: Self-pay | Admitting: General Surgery

## 2022-05-29 ENCOUNTER — Ambulatory Visit (HOSPITAL_BASED_OUTPATIENT_CLINIC_OR_DEPARTMENT_OTHER): Payer: Commercial Managed Care - HMO | Admitting: Anesthesiology

## 2022-05-29 ENCOUNTER — Ambulatory Visit
Admission: RE | Admit: 2022-05-29 | Discharge: 2022-05-29 | Disposition: A | Discharge status: Home / Self Care | Payer: Commercial Managed Care - HMO | Source: Ambulatory Visit | Attending: General Surgery | Admitting: General Surgery

## 2022-05-29 ENCOUNTER — Other Ambulatory Visit: Payer: Self-pay

## 2022-05-29 ENCOUNTER — Encounter (HOSPITAL_BASED_OUTPATIENT_CLINIC_OR_DEPARTMENT_OTHER)
Admission: RE | Disposition: A | Discharge status: Home / Self Care | Payer: Self-pay | Source: Home / Self Care | Attending: General Surgery

## 2022-05-29 DIAGNOSIS — I10 Essential (primary) hypertension: Secondary | ICD-10-CM | POA: Insufficient documentation

## 2022-05-29 DIAGNOSIS — F17210 Nicotine dependence, cigarettes, uncomplicated: Secondary | ICD-10-CM | POA: Insufficient documentation

## 2022-05-29 DIAGNOSIS — F418 Other specified anxiety disorders: Secondary | ICD-10-CM

## 2022-05-29 DIAGNOSIS — N6011 Diffuse cystic mastopathy of right breast: Secondary | ICD-10-CM | POA: Diagnosis not present

## 2022-05-29 DIAGNOSIS — R928 Other abnormal and inconclusive findings on diagnostic imaging of breast: Secondary | ICD-10-CM | POA: Diagnosis present

## 2022-05-29 DIAGNOSIS — J449 Chronic obstructive pulmonary disease, unspecified: Secondary | ICD-10-CM | POA: Diagnosis not present

## 2022-05-29 DIAGNOSIS — Z01818 Encounter for other preprocedural examination: Secondary | ICD-10-CM

## 2022-05-29 DIAGNOSIS — N6489 Other specified disorders of breast: Secondary | ICD-10-CM

## 2022-05-29 DIAGNOSIS — N6021 Fibroadenosis of right breast: Secondary | ICD-10-CM | POA: Diagnosis not present

## 2022-05-29 HISTORY — PX: RADIOACTIVE SEED GUIDED EXCISIONAL BREAST BIOPSY: SHX6490

## 2022-05-29 LAB — POCT PREGNANCY, URINE: Preg Test, Ur: NEGATIVE

## 2022-05-29 SURGERY — RADIOACTIVE SEED GUIDED BREAST BIOPSY
Anesthesia: General | Site: Breast | Laterality: Right

## 2022-05-29 MED ORDER — ACETAMINOPHEN 500 MG PO TABS
1000.0000 mg | ORAL_TABLET | ORAL | Status: AC
  Administered 2022-05-29: 1000 mg via ORAL

## 2022-05-29 MED ORDER — CHLORHEXIDINE GLUCONATE CLOTH 2 % EX PADS: 6.0000 | MEDICATED_PAD | Freq: Once | CUTANEOUS | Status: DC

## 2022-05-29 MED ORDER — FENTANYL CITRATE (PF) 100 MCG/2ML IJ SOLN: 25.0000 ug | INTRAMUSCULAR | Status: DC | PRN

## 2022-05-29 MED ORDER — ONDANSETRON HCL 4 MG/2ML IJ SOLN: 4.0000 mg | Freq: Once | INTRAMUSCULAR | Status: DC | PRN

## 2022-05-29 MED ORDER — LIDOCAINE 2% (20 MG/ML) 5 ML SYRINGE
INTRAMUSCULAR | Status: AC
  Filled 2022-05-29: qty 5

## 2022-05-29 MED ORDER — PROPOFOL 10 MG/ML IV BOLUS
INTRAVENOUS | Status: DC | PRN
  Administered 2022-05-29: 120 mg via INTRAVENOUS

## 2022-05-29 MED ORDER — OXYCODONE HCL 5 MG PO TABS: 5.0000 mg | ORAL_TABLET | Freq: Once | ORAL | Status: DC | PRN

## 2022-05-29 MED ORDER — PHENYLEPHRINE 80 MCG/ML (10ML) SYRINGE FOR IV PUSH (FOR BLOOD PRESSURE SUPPORT)
PREFILLED_SYRINGE | INTRAVENOUS | Status: AC
  Filled 2022-05-29: qty 10

## 2022-05-29 MED ORDER — LACTATED RINGERS IV SOLN: INTRAVENOUS | Status: DC

## 2022-05-29 MED ORDER — OXYCODONE HCL 5 MG PO TABS: 5.0000 mg | ORAL_TABLET | Freq: Four times a day (QID) | ORAL | 0 refills | Status: DC | PRN

## 2022-05-29 MED ORDER — EPHEDRINE 5 MG/ML INJ
INTRAVENOUS | Status: AC
  Filled 2022-05-29: qty 5

## 2022-05-29 MED ORDER — SUCCINYLCHOLINE CHLORIDE 200 MG/10ML IV SOSY
PREFILLED_SYRINGE | INTRAVENOUS | Status: AC
  Filled 2022-05-29: qty 10

## 2022-05-29 MED ORDER — MIDAZOLAM HCL 5 MG/5ML IJ SOLN
INTRAMUSCULAR | Status: DC | PRN
  Administered 2022-05-29: 2 mg via INTRAVENOUS

## 2022-05-29 MED ORDER — LIDOCAINE HCL (CARDIAC) PF 100 MG/5ML IV SOSY
PREFILLED_SYRINGE | INTRAVENOUS | Status: DC | PRN
  Administered 2022-05-29: 60 mg via INTRAVENOUS

## 2022-05-29 MED ORDER — DEXAMETHASONE SODIUM PHOSPHATE 10 MG/ML IJ SOLN
INTRAMUSCULAR | Status: AC
  Filled 2022-05-29: qty 1

## 2022-05-29 MED ORDER — OXYCODONE HCL 5 MG/5ML PO SOLN: 5.0000 mg | Freq: Once | ORAL | Status: DC | PRN

## 2022-05-29 MED ORDER — FENTANYL CITRATE (PF) 100 MCG/2ML IJ SOLN
INTRAMUSCULAR | Status: DC | PRN
  Administered 2022-05-29: 25 ug via INTRAVENOUS
  Administered 2022-05-29: 50 ug via INTRAVENOUS

## 2022-05-29 MED ORDER — CEFAZOLIN SODIUM-DEXTROSE 2-4 GM/100ML-% IV SOLN
INTRAVENOUS | Status: AC
  Filled 2022-05-29: qty 100

## 2022-05-29 MED ORDER — ACETAMINOPHEN 500 MG PO TABS
ORAL_TABLET | ORAL | Status: AC
  Filled 2022-05-29: qty 2

## 2022-05-29 MED ORDER — BUPIVACAINE-EPINEPHRINE (PF) 0.5% -1:200000 IJ SOLN
INTRAMUSCULAR | Status: AC
  Filled 2022-05-29: qty 30

## 2022-05-29 MED ORDER — MIDAZOLAM HCL 2 MG/2ML IJ SOLN
INTRAMUSCULAR | Status: AC
  Filled 2022-05-29: qty 2

## 2022-05-29 MED ORDER — EPHEDRINE SULFATE (PRESSORS) 50 MG/ML IJ SOLN
INTRAMUSCULAR | Status: DC | PRN
  Administered 2022-05-29: 15 mg via INTRAVENOUS

## 2022-05-29 MED ORDER — CEFAZOLIN SODIUM-DEXTROSE 2-4 GM/100ML-% IV SOLN
2.0000 g | INTRAVENOUS | Status: AC
  Administered 2022-05-29: 2 g via INTRAVENOUS

## 2022-05-29 MED ORDER — LIDOCAINE HCL (PF) 1 % IJ SOLN
INTRAMUSCULAR | Status: AC
  Filled 2022-05-29: qty 30

## 2022-05-29 MED ORDER — LIDOCAINE HCL 1 % IJ SOLN
INTRAMUSCULAR | Status: DC | PRN
  Administered 2022-05-29: 40 mL

## 2022-05-29 MED ORDER — FENTANYL CITRATE (PF) 100 MCG/2ML IJ SOLN
INTRAMUSCULAR | Status: AC
  Filled 2022-05-29: qty 2

## 2022-05-29 MED ORDER — DEXAMETHASONE SODIUM PHOSPHATE 4 MG/ML IJ SOLN
INTRAMUSCULAR | Status: DC | PRN
  Administered 2022-05-29: 4 mg via INTRAVENOUS

## 2022-05-29 MED ORDER — ATROPINE SULFATE 0.4 MG/ML IV SOLN
INTRAVENOUS | Status: AC
  Filled 2022-05-29: qty 1

## 2022-05-29 MED ORDER — 0.9 % SODIUM CHLORIDE (POUR BTL) OPTIME
TOPICAL | Status: DC | PRN
  Administered 2022-05-29: 75 mL

## 2022-05-29 MED ORDER — ONDANSETRON HCL 4 MG/2ML IJ SOLN
INTRAMUSCULAR | Status: AC
  Filled 2022-05-29: qty 2

## 2022-05-29 SURGICAL SUPPLY — 52 items
ADH SKN CLS APL DERMABOND .7 (GAUZE/BANDAGES/DRESSINGS) ×1
APL PRP STRL LF DISP 70% ISPRP (MISCELLANEOUS) ×1
BINDER BREAST LRG (GAUZE/BANDAGES/DRESSINGS) IMPLANT
BLADE SURG 10 STRL SS (BLADE) ×1 IMPLANT
BLADE SURG 15 STRL LF DISP TIS (BLADE) IMPLANT
BLADE SURG 15 STRL SS (BLADE)
CANISTER SUC SOCK COL 7IN (MISCELLANEOUS) IMPLANT
CANISTER SUCT 1200ML W/VALVE (MISCELLANEOUS) ×1 IMPLANT
CHLORAPREP W/TINT 26 (MISCELLANEOUS) ×1 IMPLANT
CLIP TI LARGE 6 (CLIP) ×1 IMPLANT
COVER BACK TABLE 60X90IN (DRAPES) ×1 IMPLANT
COVER MAYO STAND STRL (DRAPES) ×1 IMPLANT
COVER PROBE CYLINDRICAL 5X96 (MISCELLANEOUS) ×1 IMPLANT
DERMABOND ADVANCED .7 DNX12 (GAUZE/BANDAGES/DRESSINGS) ×1 IMPLANT
DRAPE LAPAROSCOPIC ABDOMINAL (DRAPES) ×1 IMPLANT
DRAPE UTILITY XL STRL (DRAPES) ×1 IMPLANT
ELECT COATED BLADE 2.86 ST (ELECTRODE) ×1 IMPLANT
ELECT REM PT RETURN 9FT ADLT (ELECTROSURGICAL) ×1
ELECTRODE REM PT RTRN 9FT ADLT (ELECTROSURGICAL) ×1 IMPLANT
GAUZE SPONGE 4X4 12PLY STRL LF (GAUZE/BANDAGES/DRESSINGS) ×1 IMPLANT
GLOVE BIOGEL PI IND STRL 6.5 (GLOVE) ×1 IMPLANT
GLOVE BIOGEL PI IND STRL 7.0 (GLOVE) IMPLANT
GLOVE SURG SS PI 6.0 STRL IVOR (GLOVE) IMPLANT
GOWN STRL REUS W/ TWL LRG LVL3 (GOWN DISPOSABLE) ×1 IMPLANT
GOWN STRL REUS W/ TWL XL LVL3 (GOWN DISPOSABLE) IMPLANT
GOWN STRL REUS W/TWL 2XL LVL3 (GOWN DISPOSABLE) ×1 IMPLANT
GOWN STRL REUS W/TWL LRG LVL3 (GOWN DISPOSABLE)
GOWN STRL REUS W/TWL XL LVL3 (GOWN DISPOSABLE) ×1
KIT MARKER MARGIN INK (KITS) ×1 IMPLANT
LIGHT WAVEGUIDE WIDE FLAT (MISCELLANEOUS) IMPLANT
NDL HYPO 25X1 1.5 SAFETY (NEEDLE) ×1 IMPLANT
NEEDLE HYPO 25X1 1.5 SAFETY (NEEDLE) ×1 IMPLANT
NS IRRIG 1000ML POUR BTL (IV SOLUTION) ×1 IMPLANT
PACK BASIN DAY SURGERY FS (CUSTOM PROCEDURE TRAY) ×1 IMPLANT
PENCIL SMOKE EVACUATOR (MISCELLANEOUS) ×1 IMPLANT
SLEEVE SCD COMPRESS KNEE MED (STOCKING) ×1 IMPLANT
SPIKE FLUID TRANSFER (MISCELLANEOUS) IMPLANT
SPONGE T-LAP 18X18 ~~LOC~~+RFID (SPONGE) ×1 IMPLANT
STAPLER VISISTAT 35W (STAPLE) IMPLANT
STRIP CLOSURE SKIN 1/2X4 (GAUZE/BANDAGES/DRESSINGS) ×1 IMPLANT
SUT MON AB 4-0 PC3 18 (SUTURE) ×1 IMPLANT
SUT SILK 2 0 SH (SUTURE) IMPLANT
SUT VIC AB 2-0 SH 18 (SUTURE) IMPLANT
SUT VIC AB 3-0 SH 27 (SUTURE) ×1
SUT VIC AB 3-0 SH 27X BRD (SUTURE) ×1 IMPLANT
SUT VICRYL 3-0 CR8 SH (SUTURE) IMPLANT
SYR BULB EAR ULCER 3OZ GRN STR (SYRINGE) ×1 IMPLANT
SYR CONTROL 10ML LL (SYRINGE) ×1 IMPLANT
TOWEL GREEN STERILE FF (TOWEL DISPOSABLE) ×1 IMPLANT
TRAY FAXITRON CT DISP (TRAY / TRAY PROCEDURE) ×1 IMPLANT
TUBE CONNECTING 20X1/4 (TUBING) ×1 IMPLANT
YANKAUER SUCT BULB TIP NO VENT (SUCTIONS) ×1 IMPLANT

## 2022-05-29 NOTE — Anesthesia Procedure Notes (Signed)
Procedure Name: LMA Insertion Date/Time: 05/29/2022 10:12 AM  Performed by: Willa Frater, CRNAPre-anesthesia Checklist: Patient identified, Emergency Drugs available, Suction available and Patient being monitored Patient Re-evaluated:Patient Re-evaluated prior to induction Oxygen Delivery Method: Circle system utilized Preoxygenation: Pre-oxygenation with 100% oxygen Induction Type: IV induction Ventilation: Mask ventilation without difficulty LMA: LMA inserted LMA Size: 3.0 Number of attempts: 1 Airway Equipment and Method: Bite block Placement Confirmation: positive ETCO2 Tube secured with: Tape Dental Injury: Teeth and Oropharynx as per pre-operative assessment

## 2022-05-29 NOTE — Anesthesia Postprocedure Evaluation (Signed)
Anesthesia Post Note  Patient: Karen Pham  Procedure(s) Performed: RIGHT BREAST SEED LOCALIZED EXCISIONAL BIOPSY (Right: Breast)     Patient location during evaluation: PACU Anesthesia Type: General Level of consciousness: awake and alert and oriented Pain management: pain level controlled Vital Signs Assessment: post-procedure vital signs reviewed and stable Respiratory status: spontaneous breathing, nonlabored ventilation and respiratory function stable Cardiovascular status: blood pressure returned to baseline and stable Postop Assessment: no apparent nausea or vomiting Anesthetic complications: no   No notable events documented.  Last Vitals:  Vitals:   05/29/22 1130 05/29/22 1141  BP: (!) 168/88 (!) 197/98  Pulse: 73 89  Resp: 16 18  Temp:  (!) 36.3 C  SpO2: 97% 96%    Last Pain:  Vitals:   05/29/22 1114  TempSrc:   PainSc: 0-No pain                 Lorenz Donley A.

## 2022-05-29 NOTE — Interval H&P Note (Signed)
History and Physical Interval Note:  05/29/2022 9:09 AM  [name]  has presented today for surgery, with the diagnosis of RIGHT BREAST COMPLEX SCLEROSING LESION.  The various methods of treatment have been discussed with the patient and family. After consideration of risks, benefits and other options for treatment, the patient has consented to  Procedure(s): RIGHT BREAST SEED LOCALIZED EXCISIONAL BIOPSY (Right) as a surgical intervention.  The patient's history has been reviewed, patient examined, no change in status, stable for surgery.  I have reviewed the patient's chart and labs.  Questions were answered to the patient's satisfaction.     Stark Klein

## 2022-05-29 NOTE — Transfer of Care (Signed)
Immediate Anesthesia Transfer of Care Note  Patient: Karen Pham  Procedure(s) Performed: RIGHT BREAST SEED LOCALIZED EXCISIONAL BIOPSY (Right: Breast)  Patient Location: PACU  Anesthesia Type:General  Level of Consciousness: awake, drowsy, and patient cooperative  Airway & Oxygen Therapy: Patient Spontanous Breathing and Patient connected to face mask oxygen  Post-op Assessment: Report given to RN and Post -op Vital signs reviewed and stable  Post vital signs: Reviewed and stable  Last Vitals:  Vitals Value Taken Time  BP    Temp    Pulse    Resp    SpO2      Last Pain:  Vitals:   05/29/22 0913  TempSrc: Oral  PainSc: 0-No pain      Patients Stated Pain Goal: 7 (82/95/62 1308)  Complications: No notable events documented.

## 2022-05-29 NOTE — Discharge Instructions (Addendum)
[place] Office Phone Number [phone]  BREAST BIOPSY/ PARTIAL MASTECTOMY: POST OP INSTRUCTIONS  Always review your discharge instruction sheet given to you by the facility where your surgery was performed.  IF YOU HAVE DISABILITY OR FAMILY LEAVE FORMS, YOU MUST BRING THEM TO THE OFFICE FOR PROCESSING.  DO NOT GIVE THEM TO YOUR DOCTOR.  Take 2 tylenol (acetominophen) three times a day for 3 days.  If you still have pain, add ibuprofen with food in between if able to take this (if you have kidney issues or stomach issues, do not take ibuprofen).  If both of those are not enough, add the narcotic pain pill.  If you find you are needing a lot of this overnight after surgery, call the next morning for a refill.    Prescriptions will not be filled after 5pm or on week-ends. Take your usually prescribed medications unless otherwise directed You should eat very light the first 24 hours after surgery, such as soup, crackers, pudding, etc.  Resume your normal diet the day after surgery. Most patients will experience some swelling and bruising in the breast.  Ice packs and a good support bra will help.  Swelling and bruising can take several days to resolve.  It is common to experience some constipation if taking pain medication after surgery.  Increasing fluid intake and taking a stool softener will usually help or prevent this problem from occurring.  A mild laxative (Milk of Magnesia or Miralax) should be taken according to package directions if there are no bowel movements after 48 hours. Unless discharge instructions indicate otherwise, you may remove your bandages 48 hours after surgery, and you may shower at that time.  You may have steri-strips (small skin tapes) in place directly over the incision.  These strips should be left on the skin at least for for 7-10 days.    ACTIVITIES:  You may resume regular daily activities (gradually increasing) beginning the next day.  Wearing a  good support bra or sports bra (or the breast binder) minimizes pain and swelling.  You may have sexual intercourse when it is comfortable. No heavy lifting for 1-2 weeks (not over around 10 pounds).  You may drive when you no longer are taking prescription pain medication, you can comfortably wear a seatbelt, and you can safely maneuver your car and apply brakes. RETURN TO WORK:  __________3-14 days depending on job. _______________ Dennis Bast should see your doctor in the office for a follow-up appointment approximately two weeks after your surgery.  Your doctor's nurse will typically make your follow-up appointment when she calls you with your pathology report.  Expect your pathology report 3-4 business days after your surgery.  You may call to check if you do not hear from [place] after three days.   WHEN TO CALL YOUR DOCTOR: Fever over 101.0 Nausea and/or vomiting. Extreme swelling or bruising. Continued bleeding from incision. Increased pain, redness, or drainage from the incision.  The clinic staff is available to answer your questions during regular business hours.  Please don't hesitate to call and ask to speak to one of the nurses for clinical concerns.  If you have a medical emergency, go to the nearest emergency room or call 911.  A surgeon from [facility] is always on call at the hospital.  For further questions, please visit [email or web address]     Post Anesthesia Home Care Instructions  Activity: Get plenty of rest for the remainder of the day. A responsible individual must  stay with you for 24 hours following the procedure.  For the next 24 hours, DO NOT: -Drive a car -Paediatric nurse -Drink alcoholic beverages -Take any medication unless instructed by your physician -Make any legal decisions or sign important papers.  Meals: Start with liquid foods such as gelatin or soup. Progress to regular foods as tolerated. Avoid greasy, spicy, heavy foods. If nausea  and/or vomiting occur, drink only clear liquids until the nausea and/or vomiting subsides. Call your physician if vomiting continues.  Special Instructions/Symptoms: Your throat may feel dry or sore from the anesthesia or the breathing tube placed in your throat during surgery. If this causes discomfort, gargle with warm salt water. The discomfort should disappear within 24 hours.  If you had a scopolamine patch placed behind your ear for the management of post- operative nausea and/or vomiting:  1. The medication in the patch is effective for 72 hours, after which it should be removed.  Wrap patch in a tissue and discard in the trash. Wash hands thoroughly with soap and water. 2. You may remove the patch earlier than 72 hours if you experience unpleasant side effects which may include dry mouth, dizziness or visual disturbances. 3. Avoid touching the patch. Wash your hands with soap and water after contact with the patch.  No tylenol until after 3:30pm today if needed

## 2022-05-29 NOTE — Op Note (Signed)
Right Breast Radioactive seed localized excisional biopsy  Indications: This patient presents with history of abnormal right mammogram with discordant core needle biopsy.  (Complex sclerosing lesion)  Pre-operative Diagnosis: abnormal right mammogram    Post-operative Diagnosis: abnormal right mammogram  Surgeon: Stark Klein   Anesthesia: General endotracheal anesthesia  ASA Class: 3  Procedure Details  The patient was seen in the Holding Room. The risks, benefits, complications, treatment options, and expected outcomes were discussed with the patient. The possibilities of bleeding, infection, the need for additional procedures, failure to diagnose a condition, and creating a complication requiring transfusion or operation were discussed with the patient. The patient concurred with the proposed plan, giving informed consent.  The site of surgery properly noted/marked. The patient was taken to Operating Room # 8, identified, and the procedure verified as right Breast seed localized excisional biopsy. A Time Out was held and the above information confirmed.  The right breast and chest were prepped and draped in standard fashion. A lateral incision was made near the previously placed radioactive seed.  Dissection was carried down around the point of maximum signal intensity. The cautery was used to perform the dissection.   The specimen was inked with the margin marker paint kit.    Specimen radiography confirmed inclusion of the mammographic lesion, the clip, and the seed.  The background signal in the breast was zero.  1 clip was placed in the breast cavity.  Hemostasis was achieved with cautery.  The wound was irrigated and closed with 3-0 vicryl interrupted deep dermal sutures and 4-0 monocryl running subcuticular suture.      Sterile dressings were applied. At the end of the operation, all sponge, instrument, and needle counts were correct.  Findings: Seed, clip in specimen.  posterior  margin is pectoralis  Estimated Blood Loss:  min         Specimens: right breast tissue with seed         Complications:  None; patient tolerated the procedure well.         Disposition: PACU - hemodynamically stable.         Condition: stable

## 2022-05-29 NOTE — Anesthesia Preprocedure Evaluation (Addendum)
Anesthesia Evaluation  Patient identified by MRN, date of birth, ID band Patient awake    Reviewed: Allergy & Precautions, H&P , NPO status , Patient's Chart, lab work & pertinent test results  Airway Mallampati: II  TM Distance: >3 FB Neck ROM: Full    Dental  (+) Poor Dentition, Dental Advisory Given   Pulmonary asthma , COPD,  COPD inhaler, Current SmokerPatient did not abstain from smoking.   Pulmonary exam normal breath sounds clear to auscultation       Cardiovascular hypertension, Pt. on medications  Rhythm:Regular Rate:Normal  EKG 07/13/21 NSR, Normal   Neuro/Psych  PSYCHIATRIC DISORDERS Anxiety Depression     Neuromuscular disease    GI/Hepatic negative GI ROS, Neg liver ROS,,,  Endo/Other  negative endocrine ROS  Right Breast mass-complex sclerosing lesion  Renal/GU negative Renal ROS  negative genitourinary   Musculoskeletal   Abdominal   Peds  Hematology  (+) Blood dyscrasia, anemia   Anesthesia Other Findings   Reproductive/Obstetrics negative OB ROS                             Anesthesia Physical Anesthesia Plan  ASA: 3  Anesthesia Plan: General   Post-op Pain Management: Minimal or no pain anticipated   Induction: Intravenous  PONV Risk Score and Plan: 3 and Ondansetron, Midazolam and Treatment may vary due to age or medical condition  Airway Management Planned: LMA  Additional Equipment: None  Intra-op Plan:   Post-operative Plan: Extubation in OR  Informed Consent: I have reviewed the patients History and Physical, chart, labs and discussed the procedure including the risks, benefits and alternatives for the proposed anesthesia with the patient or authorized representative who has indicated his/her understanding and acceptance.     Dental advisory given  Plan Discussed with: CRNA and Anesthesiologist  Anesthesia Plan Comments:         Anesthesia  Quick Evaluation

## 2022-05-30 ENCOUNTER — Encounter (HOSPITAL_BASED_OUTPATIENT_CLINIC_OR_DEPARTMENT_OTHER): Payer: Self-pay | Admitting: General Surgery

## 2022-05-30 ENCOUNTER — Other Ambulatory Visit: Payer: Self-pay | Admitting: Critical Care Medicine

## 2022-05-30 DIAGNOSIS — J9801 Acute bronchospasm: Secondary | ICD-10-CM

## 2022-05-30 NOTE — Telephone Encounter (Signed)
Requested Prescriptions  Pending Prescriptions Disp Refills   VENTOLIN HFA 108 (90 Base) MCG/ACT inhaler [Pharmacy Med Name: Ventolin HFA 108 (90 Base) MCG/ACT Inhalation Aerosol Solution] 18 g 0    Sig: INHALE 2 PUFFS BY MOUTH THREE TIMES DAILY AS NEEDED     Pulmonology:  Beta Agonists 2 Failed - 05/30/2022  9:20 AM      Failed - Last BP in normal range    BP Readings from Last 1 Encounters:  05/29/22 (!) 197/98         Passed - Last Heart Rate in normal range    Pulse Readings from Last 1 Encounters:  05/29/22 89         Passed - Valid encounter within last 12 months    Recent Outpatient Visits           1 month ago Primary hypertension   [place], Jarome Matin, RPH-CPP   4 months ago Dental abscess   [place], MD   5 months ago Primary hypertension   [place], MD   8 months ago Encounter for Papanicolaou smear of cervix   [place], [place]   9 months ago Primary hypertension   [place], MD       Future Appointments             In 5 days Elsie Stain, MD [place]

## 2022-05-31 LAB — SURGICAL PATHOLOGY

## 2022-06-01 ENCOUNTER — Other Ambulatory Visit: Payer: Self-pay | Admitting: Critical Care Medicine

## 2022-06-01 DIAGNOSIS — F32A Depression, unspecified: Secondary | ICD-10-CM

## 2022-06-02 NOTE — Progress Notes (Unsigned)
Established Patient Office Visit  Subjective:  Patient ID: Karen Pham, female    DOB: [birth year]  Age: 47 y.o. MRN: [id number]  CC:  No chief complaint on file.   HPI 1/18 Karen Pham presents for primary care follow up. She saw Karen Caldron, PA-C at this practice on 04/04/2021 to establish care. Her medications were refilled at that time, and labs were ordered for thyroid and magnesium levels, both of which were normal. She has had ongoing numbness and weakness in the hands and feet. She was referred to orthopedics. She attempted to go to that appointment, but her insurance is not active yet and she was stuck with a high copay that she was unable to pay. She states she has signed up for health insurance through [place] and this is set to begin on Feb 1. She has rescheduled her appointment with orthopedics to February and she is hoping she will be able to go then.  The patient's main concerns are regarding her leg/foot pain and numbness in her right hand and wrist. She said the foot pain has been going on for awhile but is getting worse, but the numbness in the right hand and wrist is new. She gets foot swelling and pain especially after work. She works at [employer] and is on her feet all day. The pain in her feet causes her to limp by the end of the day. She has a feeling of pins and needles in her right hand and wrist that will wake her up from sleep. She says this is making it difficult for her to hold objects which is frustrating for her. She tries moving her hands, stretching them in and out to try to get feeling back. This helps somewhat.  She also has a recurring "sore" on her upper gumline. She says that it will periodically swell, fill with pus, and then burst. She said it is not painful today. She says that she has gotten used to having a lot of mouth pain and only really notices when it is really bad. She would like to go to a dentist and tried going to the [facility]  clinic, but was told that they could not help her.   She is currently taking Lisinopril-HCTZ and amlodipine for her hypertension. She says she does not take the Lisinopril-HCTZ on days where she works because she says she does not like that it makes her urinate so frequently. Her BP remains slightly elevated today at 147/88.    She is currently living in sober living housing. She says her housing is safe and secure for now, but she would eventually like her own place. 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 She is not using any other illicit substances right now. She is still smoking, but she has decreased her smoking from just over 1 PPD to 1/2 PPD. She says she thinks this has helped her breathing. She also uses prescribed inhalers. She says her mood is stable right now, and she follows with Karen Canner, NP for behavioral health  She is due for routine health screenings. She states she has had a mammogram and pap smear in the past, but does not remember when or if there were any significant results. She also needs Hepatitis C and HIV screenings, and she plans on getting these done at the lab here today. She thinks she has been screened for these before but does not remember when. She  needs colon cancer screening, and is willing to do the FOBT kit. She is also interested in getting the tDAP vaccine today.  08/21/2021 Patient seen in short-term follow-up from January visit still smoking half pack a day of cigarettes and has a congested cough.  She had a roommate who had a recent febrile illness who is being evaluated for COVID.  Patient was exposed to this individual.  She does work in Nurse, learning disability for [employer].  She is due a colonoscopy and a Pap smear and mammogram.  Colonoscopy has already been referred and they have been trying to reach the patient we will give her the contact information for this.  Patient is accompanied by her close friend Karen Pham who is her primary  healthcare advocate.  On arrival blood pressure is excellent 123/85.  Patient has no other complaints.  The patient remains sober at this time.  She had a carpal tunnel surgery done on the right hand recently and has improved with this. This patient also has a follow-up visit scheduled with dental  8/29 This a 46 year old female with COPD significant tobacco use smoking 1 to 2 packs of cigarettes every 2 to 3 days.  She has been smoking since the age of 47.  Patient notes increased wheezing shortness of breath.  On arrival blood pressure elevated 156/86.  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  She is trying to move into a different position.  Patient's had several cancer screenings performed.  She had a colonoscopy which showed tubular adenomas throughout the colon at least 5 different ones.  These have been removed.  Patient had a mammogram with fibrocystic disease seen she had a biopsy of her right breast 9 o'clock position which was benign however there is a concern that this may evolve into malignancy and she has a follow-up visit with general surgery.  Patient also has yet to achieve a lung scan low-dose to screen for lung cancer given her smoking history.  The patient has been taking the amlodipine 10 mg daily and the valsartan 160 mg daily.  Patient does not follow a healthy diet at times.  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  She stays at [facility].  She is accompanied by her advocate Karen Pham who is a retired Software engineer.  Patient significantly continues with periodontal disease and multiple carious worn teeth.  She cannot afford to have dental work done at this time.  She does not have a dental plan with her health insurance.  9/19 This patient is seen in return follow-up complaining of jaw pain from a tooth irritation in the left upper jaw.  Patient also has COPD  breathing is improved.  She is still smoking less than pack a day of cigarettes and on arrival blood pressure remains elevated 148/87.  Patient is maintaining amlodipine and valsartan.  Patient does not have dental insurance.  Past Medical History:  Diagnosis Date   COPD (chronic obstructive pulmonary disease) ([place])    Hypertension    Liver cirrhosis ([facility])    Severe cannabis use disorder ([place]) 11/14/2017    Past Surgical History:  Procedure Laterality Date   BREAST BIOPSY  05/28/2022   MM RT RADIOACTIVE SEED LOC MAMMO GUIDE 05/28/2022 [facility] MAMMOGRAPHY   CARPAL TUNNEL RELEASE Right 07/16/2021   Procedure: RIGHT CARPAL TUNNEL RELEASE;  Surgeon: Sherilyn Cooter, MD;  Location: [facility];  Service: Orthopedics;  Laterality: Right;  regional with monitored anesthesia care   RADIOACTIVE SEED GUIDED EXCISIONAL BREAST BIOPSY Right 05/29/2022   Procedure: RIGHT BREAST SEED LOCALIZED EXCISIONAL BIOPSY;  Surgeon: Stark Klein, MD;  Location: [facility];  Service: General;  Laterality: Right;    Family History  Problem Relation Age of Onset   Colon cancer Neg Hx    Esophageal cancer Neg Hx    Rectal cancer Neg Hx    Stomach cancer Neg Hx     Social History   Socioeconomic History   Marital status: Single    Spouse name: Not on file   Number of children: Not on file   Years of education: Not on file   Highest education level: Not on file  Occupational History    Employer: [employer]  Tobacco Use   Smoking status: Every Day    Packs/day: 0.50    Types: Cigarettes   Smokeless tobacco: Never  Vaping Use   Vaping Use: Never used  Substance and Sexual Activity   Alcohol use: Not Currently   Drug use: Not Currently    Frequency: 1.0 times per week    Types: "Crack" cocaine, Cocaine, Other-see comments, Marijuana    Comment: 15 months clean per pt   Sexual activity: Not Currently  Other Topics Concern   Not on file  Social History Narrative   Right  handed    Some caffeine   Social Determinants of Health   Financial Resource Strain: Medium Risk (05/15/2021)   Overall Financial Resource Strain (CARDIA)    Difficulty of Paying Living Expenses: Somewhat hard  Food Insecurity: Food Insecurity Present (05/15/2021)   Hunger Vital Sign    Worried About Running Out of Food in the Last Year: Sometimes true    Ran Out of Food in the Last Year: Sometimes true  Transportation Needs: No Transportation Needs (05/15/2021)   PRAPARE - Hydrologist (Medical): No    Lack of Transportation (Non-Medical): No  Physical Activity: Sufficiently Active (05/15/2021)   Exercise Vital Sign    Days of Exercise per Week: 5 days    Minutes of Exercise per Session: 60 min  Stress: Stress Concern Present (05/15/2021)   [place]    Feeling of Stress : Very much  Social Connections: Socially Isolated (05/15/2021)   Social Connection and Isolation Panel [NHANES]    Frequency of Communication with Friends and Family: Three times a week    Frequency of Social Gatherings with Friends and Family: Once a week    Attends Religious Services: Never    Marine scientist or Organizations: No    Attends Archivist Meetings: Never    Marital Status: Never married  Intimate Partner Violence: Not At Risk (05/15/2021)   Humiliation, Afraid, Rape, and Kick questionnaire    Fear of Current or Ex-Partner: No    Emotionally Abused: No    Physically Abused: No    Sexually Abused: No    Outpatient Medications Prior to Visit  Medication Sig Dispense Refill   amLODipine (NORVASC) 10 MG tablet Take 1 tablet (10 mg total) by mouth daily. 90 tablet 1   atorvastatin (LIPITOR) 40 MG tablet Take 1 tablet (40 mg total) by mouth daily. 90 tablet 1   chlorthalidone (HYGROTON) 25 MG tablet Take 0.5 tablets (12.5 mg total) by mouth daily. 45 tablet 1   fluticasone-salmeterol (  ADVAIR)  250-50 MCG/ACT AEPB Inhale 1 puff into the lungs in the morning and at bedtime. 60 each 1   gabapentin (NEURONTIN) 100 MG capsule Take 1 capsule (100 mg total) by mouth 3 (three) times daily. 1 in AM and 2 at bedtime 120 capsule 2   oxyCODONE (OXY IR/ROXICODONE) 5 MG immediate release tablet Take 1 tablet (5 mg total) by mouth every 6 (six) hours as needed for severe pain. 8 tablet 0   predniSONE (DELTASONE) 10 MG tablet Take 4 tablets daily for 5 days then stop 20 tablet 0   sertraline (ZOLOFT) 50 MG tablet Take 1 tablet by mouth once daily 30 tablet 1   valsartan (DIOVAN) 320 MG tablet Take 1 tablet (320 mg total) by mouth daily. 90 tablet 2   VENTOLIN HFA 108 (90 Base) MCG/ACT inhaler INHALE 2 PUFFS BY MOUTH THREE TIMES DAILY AS NEEDED 18 g 1   No facility-administered medications prior to visit.    Allergies  Allergen Reactions   Latex Hives   Sulfa Antibiotics Hives    ROS Review of Systems  Constitutional:  Negative for fever and unexpected weight change.  HENT:  Positive for dental problem. Negative for ear discharge, ear pain, hearing loss, mouth sores, postnasal drip, rhinorrhea, sinus pressure, sinus pain and sore throat.   Eyes:  Negative for visual disturbance.  Respiratory:  Negative for cough, shortness of breath and wheezing.        Clear mucus  Cardiovascular:  Negative for chest pain.  Gastrointestinal:  Negative for diarrhea, nausea and vomiting.  Genitourinary:  Negative for frequency.  Musculoskeletal:  Negative for back pain and myalgias.  Neurological:  Negative for headaches.  Psychiatric/Behavioral:  Negative for dysphoric mood, self-injury and suicidal ideas. The patient is nervous/anxious.       Objective:    Physical Exam Vitals reviewed.  Constitutional:      Appearance: Normal appearance. She is well-developed and normal weight. She is not diaphoretic.  HENT:     Head: Normocephalic and atraumatic.     Nose: No nasal deformity, septal deviation,  mucosal edema, congestion or rhinorrhea.     Right Sinus: No maxillary sinus tenderness or frontal sinus tenderness.     Left Sinus: No maxillary sinus tenderness or frontal sinus tenderness.     Mouth/Throat:     Mouth: Mucous membranes are moist.     Dentition: Abnormal dentition (patient is missing most of her teeth). Dental caries and gum lesions present.     Tongue: No lesions.     Pharynx: Oropharynx is clear. No oropharyngeal exudate.     Comments: Pointing dental abscess seen in the left upper jaw around the incisor tooth Eyes:     General: No scleral icterus.    Conjunctiva/sclera: Conjunctivae normal.     Pupils: Pupils are equal, round, and reactive to light.  Neck:     Thyroid: No thyromegaly.     Vascular: No carotid bruit or JVD.     Trachea: Trachea normal. No tracheal tenderness or tracheal deviation.  Cardiovascular:     Rate and Rhythm: Normal rate and regular rhythm.     Chest Wall: PMI is not displaced.     Pulses: Normal pulses. No decreased pulses.          Dorsalis pedis pulses are 2+ on the right side and 2+ on the left side.       Posterior tibial pulses are 2+ on the right side and 2+ on the left  side.     Heart sounds: Normal heart sounds, S1 normal and S2 normal. Heart sounds not distant. No murmur heard.    No systolic murmur is present.     No diastolic murmur is present.     No friction rub. No gallop. No S3 or S4 sounds.  Pulmonary:     Effort: Pulmonary effort is normal. No tachypnea, accessory muscle usage or respiratory distress.     Breath sounds: Normal breath sounds and air entry. No stridor. No decreased breath sounds, wheezing, rhonchi or rales.  Chest:     Chest wall: No tenderness.  Abdominal:     General: Bowel sounds are normal. There is no distension.     Palpations: Abdomen is soft. Abdomen is not rigid.     Tenderness: There is no abdominal tenderness. There is no guarding or rebound.  Musculoskeletal:        General: Normal range of  motion.     Right hand: No swelling or deformity. Normal strength.     Left hand: No swelling or deformity. Normal strength.     Cervical back: Normal range of motion and neck supple. No edema, erythema or rigidity. No muscular tenderness. Normal range of motion.     Right lower leg: No edema.     Left lower leg: No edema.     Right foot: Normal pulse.     Left foot: Normal pulse.  Lymphadenopathy:     Head:     Right side of head: No submental or submandibular adenopathy.     Left side of head: No submental or submandibular adenopathy.     Cervical: No cervical adenopathy.  Skin:    General: Skin is warm and dry.     Coloration: Skin is not pale.     Findings: No rash.     Nails: There is no clubbing.  Neurological:     General: No focal deficit present.     Mental Status: She is alert and oriented to person, place, and time.     Sensory: No sensory deficit.     Motor: No weakness.  Psychiatric:        Mood and Affect: Mood normal.        Speech: Speech normal.        Behavior: Behavior normal.        Thought Content: Thought content normal.        Judgment: Judgment normal.     LMP 05/08/2022 (Exact Date)  Wt Readings from Last 3 Encounters:  05/29/22 135 lb 12.9 oz (61.6 kg)  04/04/22 136 lb 3.2 oz (61.8 kg)  02/11/22 150 lb (68 kg)     Health Maintenance Due  Topic Date Due   COVID-19 Vaccine (1) Never done   INFLUENZA VACCINE  Never done    There are no preventive care reminders to display for this patient.  Lab Results  Component Value Date   TSH 2.040 04/04/2021   Lab Results  Component Value Date   WBC 10.2 02/11/2022   HGB 13.8 02/11/2022   HCT 40.6 02/11/2022   MCV 97.4 02/11/2022   PLT 331 02/11/2022   Lab Results  Component Value Date   NA 138 04/04/2022   K 4.6 04/04/2022   CO2 23 04/04/2022   GLUCOSE 89 04/04/2022   BUN 18 04/04/2022   CREATININE 1.29 (H) 04/04/2022   BILITOT 0.3 04/04/2022   ALKPHOS 61 04/04/2022   AST 18 04/04/2022    ALT 11  04/04/2022   PROT 6.6 04/04/2022   ALBUMIN 4.5 04/04/2022   CALCIUM 9.8 04/04/2022   ANIONGAP 10 02/11/2022   EGFR 52 (L) 04/04/2022   Lab Results  Component Value Date   CHOL 153 05/23/2021   Lab Results  Component Value Date   HDL 49 05/23/2021   Lab Results  Component Value Date   LDLCALC 92 05/23/2021   Lab Results  Component Value Date   TRIG 56 05/23/2021   Lab Results  Component Value Date   CHOLHDL 3.1 05/23/2021   No results found for: "HGBA1C" Colonoscopy 10/04/21 - The examined portion of the ileum was normal.                           - Three 3 to 10 mm polyps in the ascending colon,                            removed with a cold snare. Resected and retrieved.                           - Two 4 to 8 mm polyps in the rectum and in the                            sigmoid colon, removed with a cold snare. Resected                            and retrieved.                           - Non-bleeding internal hemorrhoids. Recommendation:           - Discharge patient to home (with escort).                           - Await pathology results.                           - The findings and recommendations were discussed                            with the patient. Sonny Masters "Claire" [place],   Path from Colon: Surgical [P], colon, ascending, polyp (3) - TUBULAR ADENOMA, NEGATIVE FOR HIGH-GRADE DYSPLASIA. - SERRATED MUCOSAL POLYPS WITH MILD BASAL GROWTH ABNORMALITY, FAVOR SESSILE SERRATED POLYPS, NEGATIVE FOR DYSPLASIA. 2. 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 Lymph node, needle/core biopsy, right axilla, hydromark spiral clip BENIGN FIBROADIPOSE AND FIBROMUSCULAR STROMA NEGATIVE FOR LYMPH NODE NEGATIVE FOR MALIGNANCY   Assessment & Plan:   Problem List Items Addressed This Visit   None No orders of the defined types were placed in this encounter. Multiple systems assessed 40 minutes spent with patient High risk on multiple areas multiple cancer screenings severe hypertension Follow-up: No follow-ups on file.  Asencion Noble, MD

## 2022-06-03 NOTE — Telephone Encounter (Signed)
Requested Prescriptions  Pending Prescriptions Disp Refills   gabapentin (NEURONTIN) 100 MG capsule [Pharmacy Med Name: Gabapentin 100 MG Oral Capsule] 360 capsule 0    Sig: TAKE 1 CAPSULE BY MOUTH IN THE MORNING AND 2 AT BEDTIME     Neurology: Anticonvulsants - gabapentin Failed - 06/01/2022  6:02 AM      Failed - Cr in normal range and within 360 days    Creatinine, Ser  Date Value Ref Range Status  04/04/2022 1.29 (H) 0.57 - 1.00 mg/dL Final         Passed - Completed PHQ-2 or PHQ-9 in the last 360 days      Passed - Valid encounter within last 12 months    Recent Outpatient Visits           2 months ago Primary hypertension   [place], Jarome Matin, RPH-CPP   4 months ago Dental abscess   [place], MD   5 months ago Primary hypertension   [place], MD   8 months ago Encounter for Papanicolaou smear of cervix   [place], [place]   9 months ago Primary hypertension   [place], MD       Future Appointments             Tomorrow Elsie Stain, MD [place]

## 2022-06-04 ENCOUNTER — Ambulatory Visit: Payer: Medicaid Other | Attending: Critical Care Medicine | Admitting: Critical Care Medicine

## 2022-06-04 ENCOUNTER — Encounter: Payer: Self-pay | Admitting: Critical Care Medicine

## 2022-06-04 VITALS — BP 109/83 | HR 74 | Temp 99.0°F | Wt 138.2 lb

## 2022-06-04 DIAGNOSIS — R0602 Shortness of breath: Secondary | ICD-10-CM | POA: Diagnosis not present

## 2022-06-04 DIAGNOSIS — E785 Hyperlipidemia, unspecified: Secondary | ICD-10-CM | POA: Insufficient documentation

## 2022-06-04 DIAGNOSIS — I10 Essential (primary) hypertension: Secondary | ICD-10-CM

## 2022-06-04 DIAGNOSIS — K029 Dental caries, unspecified: Secondary | ICD-10-CM | POA: Insufficient documentation

## 2022-06-04 DIAGNOSIS — J441 Chronic obstructive pulmonary disease with (acute) exacerbation: Secondary | ICD-10-CM | POA: Diagnosis not present

## 2022-06-04 DIAGNOSIS — F32A Depression, unspecified: Secondary | ICD-10-CM

## 2022-06-04 DIAGNOSIS — K047 Periapical abscess without sinus: Secondary | ICD-10-CM | POA: Diagnosis not present

## 2022-06-04 DIAGNOSIS — K7030 Alcoholic cirrhosis of liver without ascites: Secondary | ICD-10-CM | POA: Diagnosis not present

## 2022-06-04 DIAGNOSIS — J449 Chronic obstructive pulmonary disease, unspecified: Secondary | ICD-10-CM | POA: Diagnosis present

## 2022-06-04 DIAGNOSIS — N6489 Other specified disorders of breast: Secondary | ICD-10-CM

## 2022-06-04 DIAGNOSIS — Z5986 Financial insecurity: Secondary | ICD-10-CM | POA: Diagnosis not present

## 2022-06-04 DIAGNOSIS — F17210 Nicotine dependence, cigarettes, uncomplicated: Secondary | ICD-10-CM | POA: Diagnosis not present

## 2022-06-04 DIAGNOSIS — F1220 Cannabis dependence, uncomplicated: Secondary | ICD-10-CM

## 2022-06-04 DIAGNOSIS — F418 Other specified anxiety disorders: Secondary | ICD-10-CM | POA: Insufficient documentation

## 2022-06-04 DIAGNOSIS — F1021 Alcohol dependence, in remission: Secondary | ICD-10-CM | POA: Diagnosis not present

## 2022-06-04 DIAGNOSIS — J9801 Acute bronchospasm: Secondary | ICD-10-CM

## 2022-06-04 DIAGNOSIS — Z720 Tobacco use: Secondary | ICD-10-CM

## 2022-06-04 DIAGNOSIS — K7460 Unspecified cirrhosis of liver: Secondary | ICD-10-CM | POA: Diagnosis not present

## 2022-06-04 DIAGNOSIS — R200 Anesthesia of skin: Secondary | ICD-10-CM | POA: Insufficient documentation

## 2022-06-04 DIAGNOSIS — Z79899 Other long term (current) drug therapy: Secondary | ICD-10-CM | POA: Insufficient documentation

## 2022-06-04 DIAGNOSIS — F419 Anxiety disorder, unspecified: Secondary | ICD-10-CM

## 2022-06-04 MED ORDER — GABAPENTIN 100 MG PO CAPS: ORAL_CAPSULE | ORAL | 0 refills | Status: DC

## 2022-06-04 MED ORDER — FLUTICASONE-SALMETEROL 250-50 MCG/ACT IN AEPB: 1.0000 | INHALATION_SPRAY | Freq: Two times a day (BID) | RESPIRATORY_TRACT | 1 refills | Status: DC

## 2022-06-04 MED ORDER — ALBUTEROL SULFATE (2.5 MG/3ML) 0.083% IN NEBU
2.5000 mg | INHALATION_SOLUTION | Freq: Four times a day (QID) | RESPIRATORY_TRACT | 1 refills | Status: DC | PRN
  Filled 2022-10-31: qty 150, 13d supply, fill #0

## 2022-06-04 MED ORDER — VALSARTAN 320 MG PO TABS: 320.0000 mg | ORAL_TABLET | Freq: Every day | ORAL | 2 refills | Status: DC

## 2022-06-04 MED ORDER — CEFDINIR 300 MG PO CAPS: 300.0000 mg | ORAL_CAPSULE | Freq: Two times a day (BID) | ORAL | 0 refills | Status: AC

## 2022-06-04 MED ORDER — ATORVASTATIN CALCIUM 40 MG PO TABS: 40.0000 mg | ORAL_TABLET | Freq: Every day | ORAL | 1 refills | Status: DC

## 2022-06-04 MED ORDER — AMLODIPINE BESYLATE 10 MG PO TABS: 10.0000 mg | ORAL_TABLET | Freq: Every day | ORAL | 1 refills | Status: DC

## 2022-06-04 MED ORDER — VENTOLIN HFA 108 (90 BASE) MCG/ACT IN AERS: INHALATION_SPRAY | RESPIRATORY_TRACT | 1 refills | Status: DC

## 2022-06-04 MED ORDER — METHYLPREDNISOLONE ACETATE 80 MG/ML IJ SUSP
80.0000 mg | Freq: Once | INTRAMUSCULAR | Status: AC
  Administered 2022-06-04: 80 mg via INTRAMUSCULAR

## 2022-06-04 MED ORDER — SERTRALINE HCL 50 MG PO TABS: 50.0000 mg | ORAL_TABLET | Freq: Every day | ORAL | 2 refills | Status: DC

## 2022-06-04 NOTE — Patient Instructions (Addendum)
Albuterol nebulizer medication sent to pharmacy  Depomedrol steroid injection given in office today for inflammation in lungs  Start Cefdinir one twice a day for 7days  antibiotic for bronchitis  No other medication changes , refills sent to your [facility] pharmacy  Return Dr Joya Gaskins 2 months

## 2022-06-05 DIAGNOSIS — J441 Chronic obstructive pulmonary disease with (acute) exacerbation: Secondary | ICD-10-CM | POA: Insufficient documentation

## 2022-06-05 DIAGNOSIS — K7030 Alcoholic cirrhosis of liver without ascites: Secondary | ICD-10-CM | POA: Insufficient documentation

## 2022-06-05 NOTE — Assessment & Plan Note (Signed)
Hypertension well-controlled no change in medications

## 2022-06-05 NOTE — Assessment & Plan Note (Signed)
Acute exacerbation of COPD will give patient a Depo-Medrol injection 80 mg IM and will give cefdinir 300 mg twice daily for 7 days patient will have albuterol refilled and will continue the Advair inhaler work on smoking cessation

## 2022-06-05 NOTE — Assessment & Plan Note (Signed)
Has decreased marijuana use

## 2022-06-05 NOTE — Assessment & Plan Note (Signed)
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

## 2022-06-05 NOTE — Assessment & Plan Note (Signed)
    Current smoking consumption amount: 1/2 pack a day  Dicsussion on advise to quit smoking and smoking impacts: Cardiovascular lung impacts  Patient's willingness to quit: Wants to quit  Methods to quit smoking discussed: Nicotine replacement behavioral modification  Medication management of smoking session drugs discussed: Nicotine replacement  Resources provided:  AVS   Setting quit date not established  Follow-up arranged 2 months   Time spent counseling the patient:   5 minutes

## 2022-06-05 NOTE — Assessment & Plan Note (Signed)
This has resolved has plans to follow-up with dentistry for tooth removal

## 2022-06-20 ENCOUNTER — Ambulatory Visit: Payer: Commercial Managed Care - HMO | Admitting: Critical Care Medicine

## 2022-06-25 ENCOUNTER — Encounter (HOSPITAL_COMMUNITY): Payer: Self-pay

## 2022-07-10 ENCOUNTER — Other Ambulatory Visit: Payer: Self-pay | Admitting: General Surgery

## 2022-07-10 DIAGNOSIS — Z1231 Encounter for screening mammogram for malignant neoplasm of breast: Secondary | ICD-10-CM

## 2022-07-16 ENCOUNTER — Telehealth: Payer: Self-pay | Admitting: Critical Care Medicine

## 2022-07-16 NOTE — Telephone Encounter (Signed)
Copied from [facility] [id number]. Topic: General - Other >> Jul 16, 2022  3:05 PM Everette C wrote: Reason for CRM: Lovena Le, Care Manager at [facility] has called to follow up on a previously submitted record request from 06/28/22  Please contact Lovena Le further when possible to discuss the transfer of records

## 2022-07-17 NOTE — Telephone Encounter (Signed)
Called Ms.Lovena Le with [facility] and gave her the number for medical records

## 2022-07-29 ENCOUNTER — Other Ambulatory Visit: Payer: Self-pay

## 2022-08-01 ENCOUNTER — Telehealth: Payer: Self-pay | Admitting: Critical Care Medicine

## 2022-08-01 ENCOUNTER — Other Ambulatory Visit: Payer: Self-pay

## 2022-08-01 DIAGNOSIS — J9801 Acute bronchospasm: Secondary | ICD-10-CM

## 2022-08-01 MED ORDER — VENTOLIN HFA 108 (90 BASE) MCG/ACT IN AERS
2.0000 | INHALATION_SPRAY | Freq: Three times a day (TID) | RESPIRATORY_TRACT | 1 refills | Status: DC | PRN
  Filled 2022-08-01: qty 18, 33d supply, fill #0
  Filled 2022-09-13: qty 18, 33d supply, fill #1

## 2022-08-01 NOTE — Telephone Encounter (Signed)
Rx sent to our pharmacy 

## 2022-08-01 NOTE — Telephone Encounter (Signed)
Medication Refill - Medication: VENTOLIN HFA 108 (90 Base) MCG/ACT inhaler  *Patient wants generic of medication*  Has the patient contacted their pharmacy? Yes.   Pharmacy told patient her cost was 62.00. Patient would like the generic brand of the medication so it will be cheaper.  Preferred Pharmacy (with phone number or street name):  [place] Phone: [phone]  Fax: [phone]     Has the patient been seen for an appointment in the last year OR does the patient have an upcoming appointment? Yes.    Agent: Please be advised that RX refills may take up to 3 business days. We ask that you follow-up with your pharmacy.

## 2022-08-02 ENCOUNTER — Other Ambulatory Visit: Payer: Self-pay

## 2022-08-06 ENCOUNTER — Ambulatory Visit: Payer: Commercial Managed Care - HMO | Admitting: Critical Care Medicine

## 2022-08-13 ENCOUNTER — Other Ambulatory Visit: Payer: Self-pay | Admitting: Critical Care Medicine

## 2022-08-13 DIAGNOSIS — F419 Anxiety disorder, unspecified: Secondary | ICD-10-CM

## 2022-08-13 DIAGNOSIS — F418 Other specified anxiety disorders: Secondary | ICD-10-CM

## 2022-08-13 NOTE — Telephone Encounter (Signed)
Medication Refill - Medication: sertraline (ZOLOFT) 50 MG tablet , gabapentin (NEURONTIN) 100 MG capsule   Has the patient contacted their pharmacy? No. No, more refills.   (Agent: If no, request that the patient contact the pharmacy for the refill. If patient does not wish to contact the pharmacy document the reason why and proceed with request.)   Preferred Pharmacy (with phone number or street name):  [facility]  [address] [place] [address]  Phone: [phone] Fax: [phone]  Hours: M-F 7:30a-6:00p   Has the patient been seen for an appointment in the last year OR does the patient have an upcoming appointment? Yes.    Agent: Please be advised that RX refills may take up to 3 business days. We ask that you follow-up with your pharmacy.

## 2022-08-14 ENCOUNTER — Other Ambulatory Visit: Payer: Self-pay

## 2022-08-14 MED ORDER — GABAPENTIN 100 MG PO CAPS
ORAL_CAPSULE | ORAL | 0 refills | Status: DC
  Filled 2022-08-14: qty 270, 90d supply, fill #0
  Filled 2022-10-31 – 2022-11-05 (×3): qty 90, 30d supply, fill #1

## 2022-08-14 NOTE — Telephone Encounter (Signed)
Requested Prescriptions  Pending Prescriptions Disp Refills   gabapentin (NEURONTIN) 100 MG capsule 360 capsule 0    Sig: TAKE 1 CAPSULE BY MOUTH IN THE MORNING AND 2 AT BEDTIME     Neurology: Anticonvulsants - gabapentin Failed - 08/13/2022 11:55 AM      Failed - Cr in normal range and within 360 days    Creatinine, Ser  Date Value Ref Range Status  04/04/2022 1.29 (H) 0.57 - 1.00 mg/dL Final         Passed - Completed PHQ-2 or PHQ-9 in the last 360 days      Passed - Valid encounter within last 12 months    Recent Outpatient Visits           2 months ago COPD exacerbation [facility])   [place] [facility] Storm Frisk, MD   4 months ago Primary hypertension   [place] [facility] [place], Cornelius Moras, RPH-CPP   6 months ago Dental abscess   [facility] Storm Frisk, MD   7 months ago Primary hypertension   [place] [facility] Storm Frisk, MD   11 months ago Encounter for Papanicolaou smear of cervix   [place] [facility], Marzella Schlein, [place]       Future Appointments             In 1 month Storm Frisk, MD [facility]             sertraline (ZOLOFT) 50 MG tablet 90 tablet 2    Sig: Take 1 tablet (50 mg total) by mouth daily.     Psychiatry:  Antidepressants - SSRI - sertraline Passed - 08/13/2022 11:55 AM      Passed - AST in normal range and within 360 days    AST  Date Value Ref Range Status  04/04/2022 18 0 - 40 IU/L Final         Passed - ALT in normal range and within 360 days    ALT  Date Value Ref Range Status  04/04/2022 11 0 - 32 IU/L Final         Passed - Completed PHQ-2 or PHQ-9 in the last 360 days      Passed - Valid encounter within last 6 months    Recent Outpatient Visits           2 months ago COPD exacerbation [facility])   [place] [facility] Storm Frisk, MD   4 months ago Primary hypertension   [place] [place], Cornelius Moras, RPH-CPP   6 months ago Dental abscess   [facility] Storm Frisk, MD   7 months ago Primary hypertension   [place] [facility] Storm Frisk, MD   11 months ago Encounter for Papanicolaou smear of cervix   [facility] [place], Marzella Schlein, [place]       Future Appointments             In 1 month Delford Field, Charlcie Cradle, MD [facility]

## 2022-08-16 ENCOUNTER — Other Ambulatory Visit: Payer: Self-pay

## 2022-08-16 ENCOUNTER — Other Ambulatory Visit: Payer: Self-pay | Admitting: Critical Care Medicine

## 2022-08-16 DIAGNOSIS — F418 Other specified anxiety disorders: Secondary | ICD-10-CM

## 2022-08-16 MED ORDER — SERTRALINE HCL 50 MG PO TABS
50.0000 mg | ORAL_TABLET | Freq: Every day | ORAL | 1 refills | Status: DC
  Filled 2022-08-16: qty 30, 30d supply, fill #0
  Filled 2022-09-13: qty 30, 30d supply, fill #1

## 2022-08-19 ENCOUNTER — Other Ambulatory Visit: Payer: Self-pay

## 2022-09-13 ENCOUNTER — Other Ambulatory Visit: Payer: Self-pay

## 2022-09-16 ENCOUNTER — Other Ambulatory Visit: Payer: Self-pay

## 2022-09-16 NOTE — Progress Notes (Signed)
   Karen Pham [birth year] [id number]  Patient seen by Sharman Cheek, PharmD Candidate on 09/16/22 while they were picking up prescriptions at [facility].  Blood Pressure Readings: Systolic BP today: 101 Diastolic BP today: 69   Medication review was performed. Is the patient taking their medications as prescribed?: Yes    The patient has follow up scheduled:  PCP: Storm Frisk, MD   [name], Student-PharmD

## 2022-09-24 ENCOUNTER — Ambulatory Visit: Payer: Commercial Managed Care - HMO | Admitting: Critical Care Medicine

## 2022-10-08 ENCOUNTER — Other Ambulatory Visit: Payer: Self-pay | Admitting: Critical Care Medicine

## 2022-10-08 ENCOUNTER — Other Ambulatory Visit: Payer: Self-pay

## 2022-10-08 DIAGNOSIS — E785 Hyperlipidemia, unspecified: Secondary | ICD-10-CM

## 2022-10-08 MED ORDER — AMOXICILLIN 500 MG PO CAPS
500.0000 mg | ORAL_CAPSULE | Freq: Three times a day (TID) | ORAL | 0 refills | Status: AC
  Filled 2022-10-08: qty 21, 7d supply, fill #0

## 2022-10-08 MED ORDER — ACETAMINOPHEN-CODEINE 300-30 MG PO TABS
1.0000 | ORAL_TABLET | Freq: Four times a day (QID) | ORAL | 0 refills | Status: DC
  Filled 2022-10-08: qty 12, 3d supply, fill #0

## 2022-10-08 MED ORDER — IBUPROFEN 800 MG PO TABS
800.0000 mg | ORAL_TABLET | Freq: Four times a day (QID) | ORAL | 0 refills | Status: DC | PRN
  Filled 2022-10-08: qty 21, 6d supply, fill #0

## 2022-10-08 NOTE — Telephone Encounter (Signed)
  Requested Prescriptions  Refused Prescriptions Disp Refills   atorvastatin (LIPITOR) 40 MG tablet 90 tablet 1    Sig: Take 1 tablet (40 mg total) by mouth daily.     Cardiovascular:  Antilipid - Statins Failed - 10/08/2022 10:40 AM      Failed - Lipid Panel in normal range within the last 12 months    Cholesterol, Total  Date Value Ref Range Status  05/23/2021 153 100 - 199 mg/dL Final   LDL Chol Calc (NIH)  Date Value Ref Range Status  05/23/2021 92 0 - 99 mg/dL Final   HDL  Date Value Ref Range Status  05/23/2021 49 >39 mg/dL Final   Triglycerides  Date Value Ref Range Status  05/23/2021 56 0 - 149 mg/dL Final         Passed - Patient is not pregnant      Passed - Valid encounter within last 12 months    Recent Outpatient Visits           4 months ago COPD exacerbation (HCC)   [place] [facility] Storm Frisk, MD   6 months ago Primary hypertension   [place] [facility] [place], Cornelius Moras, RPH-CPP   8 months ago Dental abscess   [place] [facility] Storm Frisk, MD   9 months ago Primary hypertension   [place] [facility] Storm Frisk, MD   1 year ago Encounter for Papanicolaou smear of cervix   [facility], Marzella Schlein, [place]       Future Appointments             In 1 month Delford Field, Charlcie Cradle, MD [facility]

## 2022-10-09 ENCOUNTER — Other Ambulatory Visit: Payer: Self-pay

## 2022-10-11 ENCOUNTER — Other Ambulatory Visit: Payer: Self-pay | Admitting: Critical Care Medicine

## 2022-10-11 DIAGNOSIS — F418 Other specified anxiety disorders: Secondary | ICD-10-CM

## 2022-10-14 ENCOUNTER — Other Ambulatory Visit: Payer: Self-pay | Admitting: Critical Care Medicine

## 2022-10-14 ENCOUNTER — Other Ambulatory Visit: Payer: Self-pay

## 2022-10-14 DIAGNOSIS — E785 Hyperlipidemia, unspecified: Secondary | ICD-10-CM

## 2022-10-14 MED ORDER — SERTRALINE HCL 50 MG PO TABS
50.0000 mg | ORAL_TABLET | Freq: Every day | ORAL | 0 refills | Status: DC
Start: 2022-10-14 — End: 2022-12-05
  Filled 2022-10-14: qty 90, 90d supply, fill #0

## 2022-10-14 MED ORDER — ATORVASTATIN CALCIUM 40 MG PO TABS
40.0000 mg | ORAL_TABLET | Freq: Every day | ORAL | 0 refills | Status: DC
Start: 2022-10-14 — End: 2022-12-05
  Filled 2022-10-14: qty 90, 90d supply, fill #0

## 2022-10-14 NOTE — Telephone Encounter (Signed)
Requested Prescriptions  Pending Prescriptions Disp Refills   sertraline (ZOLOFT) 50 MG tablet 90 tablet 0    Sig: Take 1 tablet (50 mg total) by mouth daily.     Psychiatry:  Antidepressants - SSRI - sertraline Passed - 10/11/2022  7:45 PM      Passed - AST in normal range and within 360 days    AST  Date Value Ref Range Status  04/04/2022 18 0 - 40 IU/L Final         Passed - ALT in normal range and within 360 days    ALT  Date Value Ref Range Status  04/04/2022 11 0 - 32 IU/L Final         Passed - Completed PHQ-2 or PHQ-9 in the last 360 days      Passed - Valid encounter within last 6 months    Recent Outpatient Visits           4 months ago COPD exacerbation [facility])   [place] [facility] Storm Frisk, MD   6 months ago Primary hypertension   [place] [facility] [place], Cornelius Moras, RPH-CPP   8 months ago Dental abscess   [facility] Storm Frisk, MD   9 months ago Primary hypertension   [place] [facility] Storm Frisk, MD   1 year ago Encounter for Papanicolaou smear of cervix   [place] [facility] [place], Marzella Schlein, [place]       Future Appointments             In 1 month Delford Field, Charlcie Cradle, MD [facility]

## 2022-10-15 ENCOUNTER — Other Ambulatory Visit: Payer: Self-pay

## 2022-10-30 ENCOUNTER — Other Ambulatory Visit: Payer: Self-pay | Admitting: Critical Care Medicine

## 2022-10-30 ENCOUNTER — Other Ambulatory Visit: Payer: Self-pay

## 2022-10-30 DIAGNOSIS — J9801 Acute bronchospasm: Secondary | ICD-10-CM

## 2022-10-30 MED ORDER — VENTOLIN HFA 108 (90 BASE) MCG/ACT IN AERS
2.0000 | INHALATION_SPRAY | Freq: Three times a day (TID) | RESPIRATORY_TRACT | 1 refills | Status: DC | PRN
Start: 2022-10-30 — End: 2022-12-05
  Filled 2022-10-30: qty 18, 33d supply, fill #0

## 2022-10-31 ENCOUNTER — Other Ambulatory Visit: Payer: Self-pay

## 2022-11-01 ENCOUNTER — Other Ambulatory Visit: Payer: Self-pay

## 2022-11-04 ENCOUNTER — Other Ambulatory Visit: Payer: Self-pay

## 2022-11-05 ENCOUNTER — Other Ambulatory Visit: Payer: Self-pay

## 2022-11-05 ENCOUNTER — Ambulatory Visit: Payer: Medicaid Other

## 2022-11-15 ENCOUNTER — Ambulatory Visit: Payer: Medicaid Other

## 2022-11-18 ENCOUNTER — Ambulatory Visit
Admission: RE | Admit: 2022-11-18 | Discharge: 2022-11-18 | Disposition: A | Discharge status: Home / Self Care | Payer: MEDICAID | Source: Ambulatory Visit | Attending: General Surgery | Admitting: General Surgery

## 2022-11-18 DIAGNOSIS — Z1231 Encounter for screening mammogram for malignant neoplasm of breast: Secondary | ICD-10-CM

## 2022-11-19 ENCOUNTER — Other Ambulatory Visit: Payer: Self-pay

## 2022-11-19 MED ORDER — IBUPROFEN 800 MG PO TABS
800.0000 mg | ORAL_TABLET | Freq: Four times a day (QID) | ORAL | 0 refills | Status: DC | PRN
  Filled 2022-11-19: qty 21, 6d supply, fill #0

## 2022-12-02 ENCOUNTER — Other Ambulatory Visit: Payer: Self-pay | Admitting: Critical Care Medicine

## 2022-12-02 ENCOUNTER — Other Ambulatory Visit: Payer: Self-pay | Admitting: General Surgery

## 2022-12-02 DIAGNOSIS — Z1231 Encounter for screening mammogram for malignant neoplasm of breast: Secondary | ICD-10-CM

## 2022-12-02 DIAGNOSIS — R2231 Localized swelling, mass and lump, right upper limb: Secondary | ICD-10-CM

## 2022-12-05 ENCOUNTER — Other Ambulatory Visit: Payer: Self-pay

## 2022-12-05 ENCOUNTER — Ambulatory Visit
Admission: RE | Admit: 2022-12-05 | Discharge: 2022-12-05 | Disposition: A | Discharge status: Home / Self Care | Payer: MEDICAID | Source: Ambulatory Visit | Attending: General Surgery | Admitting: General Surgery

## 2022-12-05 ENCOUNTER — Ambulatory Visit: Payer: MEDICAID

## 2022-12-05 ENCOUNTER — Ambulatory Visit: Payer: MEDICAID | Attending: Critical Care Medicine | Admitting: Critical Care Medicine

## 2022-12-05 ENCOUNTER — Encounter: Payer: Self-pay | Admitting: Critical Care Medicine

## 2022-12-05 VITALS — BP 164/83 | HR 68 | Ht 66.0 in | Wt 138.4 lb

## 2022-12-05 DIAGNOSIS — M7989 Other specified soft tissue disorders: Secondary | ICD-10-CM | POA: Diagnosis not present

## 2022-12-05 DIAGNOSIS — Z1231 Encounter for screening mammogram for malignant neoplasm of breast: Secondary | ICD-10-CM | POA: Insufficient documentation

## 2022-12-05 DIAGNOSIS — F17210 Nicotine dependence, cigarettes, uncomplicated: Secondary | ICD-10-CM | POA: Diagnosis not present

## 2022-12-05 DIAGNOSIS — I10 Essential (primary) hypertension: Secondary | ICD-10-CM

## 2022-12-05 DIAGNOSIS — R2231 Localized swelling, mass and lump, right upper limb: Secondary | ICD-10-CM

## 2022-12-05 DIAGNOSIS — Z720 Tobacco use: Secondary | ICD-10-CM

## 2022-12-05 DIAGNOSIS — Z5986 Financial insecurity: Secondary | ICD-10-CM | POA: Diagnosis not present

## 2022-12-05 DIAGNOSIS — J42 Unspecified chronic bronchitis: Secondary | ICD-10-CM | POA: Diagnosis not present

## 2022-12-05 DIAGNOSIS — F419 Anxiety disorder, unspecified: Secondary | ICD-10-CM | POA: Diagnosis not present

## 2022-12-05 DIAGNOSIS — K2970 Gastritis, unspecified, without bleeding: Secondary | ICD-10-CM

## 2022-12-05 DIAGNOSIS — Z5982 Transportation insecurity: Secondary | ICD-10-CM | POA: Insufficient documentation

## 2022-12-05 DIAGNOSIS — F418 Other specified anxiety disorders: Secondary | ICD-10-CM | POA: Diagnosis not present

## 2022-12-05 DIAGNOSIS — E785 Hyperlipidemia, unspecified: Secondary | ICD-10-CM | POA: Diagnosis not present

## 2022-12-05 DIAGNOSIS — J9801 Acute bronchospasm: Secondary | ICD-10-CM | POA: Diagnosis not present

## 2022-12-05 DIAGNOSIS — R200 Anesthesia of skin: Secondary | ICD-10-CM | POA: Diagnosis present

## 2022-12-05 DIAGNOSIS — Z79899 Other long term (current) drug therapy: Secondary | ICD-10-CM | POA: Diagnosis not present

## 2022-12-05 DIAGNOSIS — F32A Depression, unspecified: Secondary | ICD-10-CM

## 2022-12-05 DIAGNOSIS — J4489 Other specified chronic obstructive pulmonary disease: Secondary | ICD-10-CM

## 2022-12-05 DIAGNOSIS — K047 Periapical abscess without sinus: Secondary | ICD-10-CM

## 2022-12-05 MED ORDER — GABAPENTIN 100 MG PO CAPS
ORAL_CAPSULE | ORAL | 2 refills | Status: DC
  Filled 2022-12-05: qty 90, 30d supply, fill #0
  Filled 2022-12-30: qty 90, 30d supply, fill #1
  Filled 2023-01-29: qty 90, 30d supply, fill #2
  Filled 2023-03-11: qty 90, 30d supply, fill #3
  Filled 2023-04-08: qty 90, 30d supply, fill #4
  Filled 2023-05-08: qty 90, 30d supply, fill #5
  Filled 2023-06-14: qty 90, 30d supply, fill #6
  Filled 2023-07-15: qty 90, 30d supply, fill #7

## 2022-12-05 MED ORDER — ATORVASTATIN CALCIUM 40 MG PO TABS
40.0000 mg | ORAL_TABLET | Freq: Every day | ORAL | 2 refills | Status: DC
Start: 2022-12-05 — End: 2023-10-14
  Filled 2022-12-05 – 2023-01-08 (×3): qty 90, 90d supply, fill #0
  Filled 2023-03-14: qty 90, 90d supply, fill #1
  Filled 2023-07-15: qty 90, 90d supply, fill #2

## 2022-12-05 MED ORDER — VALSARTAN 320 MG PO TABS
320.0000 mg | ORAL_TABLET | Freq: Every day | ORAL | 2 refills | Status: DC
  Filled 2022-12-05: qty 90, 90d supply, fill #0
  Filled 2023-03-11: qty 90, 90d supply, fill #1
  Filled 2023-05-08 – 2023-05-16 (×3): qty 90, 90d supply, fill #2

## 2022-12-05 MED ORDER — AMLODIPINE BESYLATE 10 MG PO TABS
10.0000 mg | ORAL_TABLET | Freq: Every day | ORAL | 1 refills | Status: DC
Start: 2022-12-05 — End: 2023-05-20
  Filled 2022-12-05: qty 90, 90d supply, fill #0
  Filled 2023-03-11: qty 90, 90d supply, fill #1

## 2022-12-05 MED ORDER — SERTRALINE HCL 50 MG PO TABS
50.0000 mg | ORAL_TABLET | Freq: Every day | ORAL | 2 refills | Status: DC
  Filled 2022-12-05 – 2023-01-17 (×2): qty 90, 90d supply, fill #0
  Filled 2023-04-21: qty 90, 90d supply, fill #1
  Filled 2023-07-16: qty 90, 90d supply, fill #2

## 2022-12-05 MED ORDER — NICOTINE 14 MG/24HR TD PT24
14.0000 mg | MEDICATED_PATCH | Freq: Every day | TRANSDERMAL | 0 refills | Status: DC
  Filled 2022-12-05: qty 28, 28d supply, fill #0

## 2022-12-05 MED ORDER — NICOTINE POLACRILEX 4 MG MT LOZG
4.0000 mg | LOZENGE | OROMUCOSAL | 0 refills | Status: DC | PRN
  Filled 2022-12-05: qty 72, 30d supply, fill #0

## 2022-12-05 MED ORDER — PANTOPRAZOLE SODIUM 40 MG PO TBEC
40.0000 mg | DELAYED_RELEASE_TABLET | Freq: Every day | ORAL | 3 refills | Status: DC
  Filled 2022-12-05: qty 30, 30d supply, fill #0
  Filled 2022-12-29: qty 30, 30d supply, fill #1
  Filled 2023-01-27: qty 30, 30d supply, fill #2

## 2022-12-05 MED ORDER — VENTOLIN HFA 108 (90 BASE) MCG/ACT IN AERS
2.0000 | INHALATION_SPRAY | Freq: Three times a day (TID) | RESPIRATORY_TRACT | 1 refills | Status: DC | PRN
  Filled 2022-12-05: qty 18, 33d supply, fill #0
  Filled 2022-12-29: qty 18, 33d supply, fill #1

## 2022-12-05 MED ORDER — FLUTICASONE-SALMETEROL 250-50 MCG/ACT IN AEPB
1.0000 | INHALATION_SPRAY | Freq: Two times a day (BID) | RESPIRATORY_TRACT | 1 refills | Status: DC
  Filled 2022-12-05: qty 60, 30d supply, fill #0
  Filled 2022-12-30: qty 60, 30d supply, fill #1

## 2022-12-05 NOTE — Assessment & Plan Note (Signed)
Resolved with dental care

## 2022-12-05 NOTE — Assessment & Plan Note (Signed)
Continue with statin. 

## 2022-12-05 NOTE — Assessment & Plan Note (Signed)
    Current smoking consumption amount: 1/2 pack a day  Dicsussion on advise to quit smoking and smoking impacts: Cardiovascular lung impacts  Patient's willingness to quit: Wants to quit  Methods to quit smoking discussed: Nicotine replacement behavioral modification  Medication management of smoking session drugs discussed: Nicotine replacement including both lozenge and patch  Resources provided:  AVS   Setting quit date not established  Follow-up arranged 2 months   Time spent counseling the patient:   5 minutes

## 2022-12-05 NOTE — Progress Notes (Signed)
Established Patient Office Visit  Subjective:  Patient ID: Karen Pham, female    DOB: Sep 20, 1975  Age: 47 y.o. MRN: [id number]  CC:  Chief Complaint  Patient presents with   Medical Management of Chronic Issues    HPI 1/18 [facility] presents for primary care follow up. She saw Georgian Co, PA-C at this practice on 04/04/2021 to establish care. Her medications were refilled at that time, and labs were ordered for thyroid and magnesium levels, both of which were normal. She has had ongoing numbness and weakness in the hands and feet. She was referred to orthopedics. She attempted to go to that appointment, but her insurance is not active yet and she was stuck with a high copay that she was unable to pay. She states she has signed up for health insurance through [employer] and this is set to begin on Feb 1. She has rescheduled her appointment with orthopedics to February and she is hoping she will be able to go then.  The patient's main concerns are regarding her leg/foot pain and numbness in her right hand and wrist. She said the foot pain has been going on for awhile but is getting worse, but the numbness in the right hand and wrist is new. She gets foot swelling and pain especially after work. She works at [employer] and is on her feet all day. The pain in her feet causes her to limp by the end of the day. She has a feeling of pins and needles in her right hand and wrist that will wake her up from sleep. She says this is making it difficult for her to hold objects which is frustrating for her. She tries moving her hands, stretching them in and out to try to get feeling back. This helps somewhat.  She also has a recurring "sore" on her upper gumline. She says that it will periodically swell, fill with pus, and then burst. She said it is not painful today. She says that she has gotten used to having a lot of mouth pain and only really notices when it is really bad. She would like to go to a  dentist and tried going to the [facility] clinic, but was told that they could not help her.   She is currently taking Lisinopril-HCTZ and amlodipine for her hypertension. She says she does not take the Lisinopril-HCTZ on days where she works because she says she does not like that it makes her urinate so frequently. Her BP remains slightly elevated today at 147/88.    She is currently living in sober living housing. She says her housing is safe and secure for now, but she would eventually like her own place. 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 She is not using any other illicit substances right now. She is still smoking, but she has decreased her smoking from just over 1 PPD to 1/2 PPD. She says she thinks this has helped her breathing. She also uses prescribed inhalers. She says her mood is stable right now, and she follows with Toy Cookey, NP for behavioral health  She is due for routine health screenings. She states she has had a mammogram and pap smear in the past, but does not remember when or if there were any significant results. She also needs Hepatitis C and HIV screenings, and she plans on getting these done at the lab here today. She thinks she has been screened  for these before but does not remember when. She needs colon cancer screening, and is willing to do the FOBT kit. She is also interested in getting the tDAP vaccine today.  08/21/2021 Patient seen in short-term follow-up from January visit still smoking half pack a day of cigarettes and has a congested cough.  She had a roommate who had a recent febrile illness who is being evaluated for COVID.  Patient was exposed to this individual.  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  She is due a colonoscopy and a Pap smear and mammogram.  Colonoscopy has already been referred and they have been trying to reach the patient we will give her the contact information for this.  Patient is  accompanied by her close friend [name] who is her primary healthcare advocate.  On arrival blood pressure is excellent 123/85.  Patient has no other complaints.  The patient remains sober at this time.  She had a carpal tunnel surgery done on the right hand recently and has improved with this. This patient also has a follow-up visit scheduled with dental  8/1 This a 47 year old female with COPD significant tobacco use smoking 1 to 2 packs of cigarettes every 2 to 3 days.  She has been smoking since the age of 3.  Patient notes increased wheezing shortness of breath.  On arrival blood pressure elevated 156/86.  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  She is trying to move into a different position.  Patient's had several cancer screenings performed.  She had a colonoscopy which showed tubular adenomas throughout the colon at least 5 different ones.  These have been removed.  Patient had a mammogram with fibrocystic disease seen she had a biopsy of her right breast 9 o'clock position which was benign however there is a concern that this may evolve into malignancy and she has a follow-up visit with general surgery.  Patient also has yet to achieve a lung scan low-dose to screen for lung cancer given her smoking history.  The patient has been taking the amlodipine 10 mg daily and the valsartan 160 mg daily.  Patient does not follow a healthy diet at times.  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  She stays at [facility] house.  She is accompanied by her advocate [name] who is a retired Teacher, early years/pre.  Patient significantly continues with periodontal disease and multiple carious worn teeth.  She cannot afford to have dental work done at this time.  She does not have a dental plan with her health insurance.  9/19 This patient is seen in return follow-up complaining of jaw pain from a tooth  irritation in the left upper jaw.  Patient also has COPD breathing is improved.  She is still smoking less than pack a day of cigarettes and on arrival blood pressure remains elevated 148/87.  Patient is maintaining amlodipine and valsartan.  Patient does not have dental insurance.  06/04/22 Patient seen in return follow-up has a cough on arrival with postnasal drainage and productive cough of thick yellow-green mucus.  She is still smoking less than half pack a day of cigarettes.  Note she had a breast mass evaluated with a surgical biopsy which was negative for malignancy.  Axillary nodes were negative as well.  She will need repeat imaging in 6 months.  She would like refills  on her albuterol inhaler and nebulizer medication.  She is taking the Advair daily.  There is no other complaints. Blood pressure on arrival 109/83 The patient does have a dental follow-up for severe dental caries  8/1 Patient seen in return follow-up cough is still productive of yellow mucus but improved she has used nebulizers.  She still smoking a pack a day of cigarettes.  She is yet to use the nicotine replacement.  She is having dental extractions and has been using ibuprofen over the past week now has gastritis type symptoms.  There is no other complaints.  Blood pressure on arrival was elevated 164/83 and remains high on recheck.  The patient has not been taking her medication as prescribed. Past Medical History:  Diagnosis Date   COPD (chronic obstructive pulmonary disease) (HCC)    Dental abscess 01/22/2022   Hypertension    Liver cirrhosis (HCC)    Severe cannabis use disorder (HCC) 11/14/2017    Past Surgical History:  Procedure Laterality Date   BREAST BIOPSY  05/28/2022   MM RT RADIOACTIVE SEED LOC MAMMO GUIDE 05/28/2022 [facility] MAMMOGRAPHY   CARPAL TUNNEL RELEASE Right 07/16/2021   Procedure: RIGHT CARPAL TUNNEL RELEASE;  Surgeon: Marlyne Beards, MD;  Location: [facility];  Service:  Orthopedics;  Laterality: Right;  regional with monitored anesthesia care   RADIOACTIVE SEED GUIDED EXCISIONAL BREAST BIOPSY Right 05/29/2022   Procedure: RIGHT BREAST SEED LOCALIZED EXCISIONAL BIOPSY;  Surgeon: Almond Lint, MD;  Location: [facility];  Service: General;  Laterality: Right;    Family History  Problem Relation Age of Onset   Colon cancer Neg Hx    Esophageal cancer Neg Hx    Rectal cancer Neg Hx    Stomach cancer Neg Hx     Social History   Socioeconomic History   Marital status: Single    Spouse name: Not on file   Number of children: Not on file   Years of education: Not on file   Highest education level: Some college, no degree  Occupational History    Employer: [employer]  Tobacco Use   Smoking status: Every Day    Current packs/day: 0.50    Types: Cigarettes   Smokeless tobacco: Never  Vaping Use   Vaping status: Never Used  Substance and Sexual Activity   Alcohol use: Not Currently   Drug use: Not Currently    Frequency: 1.0 times per week    Types: "Crack" cocaine, Cocaine, Other-see comments, Marijuana    Comment: 15 months clean per pt   Sexual activity: Not Currently  Other Topics Concern   Not on file  Social History Narrative   Right handed    Some caffeine   Social Determinants of Health   Financial Resource Strain: Medium Risk (12/02/2022)   Overall Financial Resource Strain (CARDIA)    Difficulty of Paying Living Expenses: Somewhat hard  Food Insecurity: Food Insecurity Present (12/02/2022)   Hunger Vital Sign    Worried About Running Out of Food in the Last Year: Sometimes true    Ran Out of Food in the Last Year: Sometimes true  Transportation Needs: Unmet Transportation Needs (12/02/2022)   PRAPARE - Transportation    Lack of Transportation (Medical): Yes    Lack of Transportation (Non-Medical): Yes  Physical Activity: Sufficiently Active (12/02/2022)   Exercise Vital Sign    Days of Exercise per Week: 7 days     Minutes of Exercise per Session: 150+ min  Stress: No  Stress Concern Present (12/02/2022)   [employer] of Occupational Health - Occupational Stress Questionnaire    Feeling of Stress : Only a little  Social Connections: Moderately Integrated (12/02/2022)   Social Connection and Isolation Panel [NHANES]    Frequency of Communication with Friends and Family: More than three times a week    Frequency of Social Gatherings with Friends and Family: Once a week    Attends Religious Services: 1 to 4 times per year    Active Member of [employer] or Organizations: Yes    Attends Banker Meetings: More than 4 times per year    Marital Status: Never married  Intimate Partner Violence: Unknown (08/08/2021)   Received from [facility]   HITS    Physically Hurt: Not on file    Insult or Talk Down To: Not on file    Threaten Physical Harm: Not on file    Scream or Curse: Not on file    Outpatient Medications Prior to Visit  Medication Sig Dispense Refill   albuterol (PROVENTIL) (2.5 MG/3ML) 0.083% nebulizer solution Take 3 mLs (2.5 mg total) by nebulization every 6 (six) hours as needed for wheezing or shortness of breath. 150 mL 1   amLODipine (NORVASC) 10 MG tablet Take 1 tablet (10 mg total) by mouth daily. 90 tablet 1   atorvastatin (LIPITOR) 40 MG tablet Take 1 tablet (40 mg total) by mouth daily. 90 tablet 0   fluticasone-salmeterol (ADVAIR) 250-50 MCG/ACT AEPB Inhale 1 puff into the lungs in the morning and at bedtime. 60 each 1   gabapentin (NEURONTIN) 100 MG capsule TAKE 1 CAPSULE BY MOUTH IN THE MORNING AND 2 AT BEDTIME 360 capsule 0   ibuprofen (ADVIL) 800 MG tablet Take 1 tablet (800 mg total) by mouth every 6 (six) hours as needed. 21 tablet 0   sertraline (ZOLOFT) 50 MG tablet Take 1 tablet (50 mg total) by mouth daily. 90 tablet 0   valsartan (DIOVAN) 320 MG tablet Take 1 tablet (320 mg total) by mouth daily. 90 tablet 2   VENTOLIN HFA 108 (90 Base) MCG/ACT inhaler Inhale 2  puffs into the lungs 3 (three) times daily as needed. 18 g 1   acetaminophen-codeine (TYLENOL #3) 300-30 MG tablet Take 1 tablet by mouth every 6 (six) hours. 12 tablet 0   ibuprofen (ADVIL) 800 MG tablet Take 1 tablet (800 mg total) by mouth every 6 (six) hours as needed for pain 21 tablet 0   No facility-administered medications prior to visit.    Allergies  Allergen Reactions   Latex Hives   Sulfa Antibiotics Hives    ROS Review of Systems  Constitutional: Negative.  Negative for fever and unexpected weight change.  HENT:  Positive for dental problem. Negative for ear discharge, ear pain, hearing loss, mouth sores, postnasal drip, rhinorrhea, sinus pressure, sinus pain, sore throat, trouble swallowing and voice change.   Eyes: Negative.  Negative for visual disturbance.  Respiratory:  Positive for cough and wheezing. Negative for apnea, choking, chest tightness, shortness of breath and stridor.        Clear mucus  Cardiovascular: Negative.  Negative for chest pain, palpitations and leg swelling.  Gastrointestinal: Negative.  Negative for abdominal distention, abdominal pain, diarrhea, nausea and vomiting.  Genitourinary: Negative.  Negative for frequency.  Musculoskeletal: Negative.  Negative for arthralgias, back pain and myalgias.  Skin: Negative.  Negative for rash.  Allergic/Immunologic: Negative.  Negative for environmental allergies and food allergies.  Neurological: Negative.  Negative for dizziness, syncope, weakness and headaches.  Hematological: Negative.  Negative for adenopathy. Does not bruise/bleed easily.  Psychiatric/Behavioral:  Negative for agitation, dysphoric mood, self-injury, sleep disturbance and suicidal ideas. The patient is nervous/anxious.       Objective:    Physical Exam Vitals reviewed.  Constitutional:      Appearance: Normal appearance. She is well-developed and normal weight. She is not diaphoretic.  HENT:     Head: Normocephalic and  atraumatic.     Nose: No nasal deformity, septal deviation, mucosal edema, congestion or rhinorrhea.     Right Sinus: No maxillary sinus tenderness or frontal sinus tenderness.     Left Sinus: No maxillary sinus tenderness or frontal sinus tenderness.     Mouth/Throat:     Mouth: Mucous membranes are moist.     Dentition: Abnormal dentition (patient is missing most of her teeth). Dental caries and gum lesions present.     Tongue: No lesions.     Pharynx: Oropharynx is clear. No oropharyngeal exudate.     Comments: Multiple teeth have been extracted additional ones yet to be extracted per dentistry Eyes:     General: No scleral icterus.    Conjunctiva/sclera: Conjunctivae normal.     Pupils: Pupils are equal, round, and reactive to light.  Neck:     Thyroid: No thyromegaly.     Vascular: No carotid bruit or JVD.     Trachea: Trachea normal. No tracheal tenderness or tracheal deviation.  Cardiovascular:     Rate and Rhythm: Normal rate and regular rhythm.     Chest Wall: PMI is not displaced.     Pulses: Normal pulses. No decreased pulses.          Dorsalis pedis pulses are 2+ on the right side and 2+ on the left side.       Posterior tibial pulses are 2+ on the right side and 2+ on the left side.     Heart sounds: Normal heart sounds, S1 normal and S2 normal. Heart sounds not distant. No murmur heard.    No systolic murmur is present.     No diastolic murmur is present.     No friction rub. No gallop. No S3 or S4 sounds.  Pulmonary:     Effort: Pulmonary effort is normal. No tachypnea, accessory muscle usage or respiratory distress.     Breath sounds: Normal air entry. No stridor. Wheezing present. No decreased breath sounds, rhonchi or rales.  Chest:     Chest wall: No tenderness.  Abdominal:     General: Bowel sounds are normal. There is no distension.     Palpations: Abdomen is soft. Abdomen is not rigid.     Tenderness: There is no abdominal tenderness. There is no guarding or  rebound.  Musculoskeletal:        General: Normal range of motion.     Right hand: No swelling or deformity. Normal strength.     Left hand: No swelling or deformity. Normal strength.     Cervical back: Normal range of motion and neck supple. No edema, erythema or rigidity. No muscular tenderness. Normal range of motion.     Right lower leg: No edema.     Left lower leg: No edema.     Right foot: Normal pulse.     Left foot: Normal pulse.  Lymphadenopathy:     Head:     Right side of head: No submental or submandibular adenopathy.     Left  side of head: No submental or submandibular adenopathy.     Cervical: No cervical adenopathy.  Skin:    General: Skin is warm and dry.     Coloration: Skin is not pale.     Findings: No rash.     Nails: There is no clubbing.  Neurological:     General: No focal deficit present.     Mental Status: She is alert and oriented to person, place, and time.     Sensory: No sensory deficit.     Motor: No weakness.  Psychiatric:        Mood and Affect: Mood normal.        Speech: Speech normal.        Behavior: Behavior normal.        Thought Content: Thought content normal.        Judgment: Judgment normal.     BP (!) 164/83 (BP Location: Left Arm, Patient Position: Sitting, Cuff Size: Normal)   Pulse 68   Ht 5\' 6"  (1.676 m)   Wt 138 lb 6.4 oz (62.8 kg)   LMP 06/19/2022 (Approximate)   SpO2 98%   BMI 22.34 kg/m  Wt Readings from Last 3 Encounters:  12/05/22 138 lb 6.4 oz (62.8 kg)  06/04/22 138 lb 3.2 oz (62.7 kg)  05/29/22 135 lb 12.9 oz (61.6 kg)     Health Maintenance Due  Topic Date Due   COVID-19 Vaccine (1 - 2023-24 season) Never done   INFLUENZA VACCINE  12/05/2022    There are no preventive care reminders to display for this patient.  Lab Results  Component Value Date   TSH 2.040 04/04/2021   Lab Results  Component Value Date   WBC 10.2 02/11/2022   HGB 13.8 02/11/2022   HCT 40.6 02/11/2022   MCV 97.4 02/11/2022    PLT 331 02/11/2022   Lab Results  Component Value Date   NA 138 04/04/2022   K 4.6 04/04/2022   CO2 23 04/04/2022   GLUCOSE 89 04/04/2022   BUN 18 04/04/2022   CREATININE 1.29 (H) 04/04/2022   BILITOT 0.3 04/04/2022   ALKPHOS 61 04/04/2022   AST 18 04/04/2022   ALT 11 04/04/2022   PROT 6.6 04/04/2022   ALBUMIN 4.5 04/04/2022   CALCIUM 9.8 04/04/2022   ANIONGAP 10 02/11/2022   EGFR 52 (L) 04/04/2022   Lab Results  Component Value Date   CHOL 153 05/23/2021   Lab Results  Component Value Date   HDL 49 05/23/2021   Lab Results  Component Value Date   LDLCALC 92 05/23/2021   Lab Results  Component Value Date   TRIG 56 05/23/2021   Lab Results  Component Value Date   CHOLHDL 3.1 05/23/2021   No results found for: "HGBA1C" Colonoscopy 10/04/21 - The examined portion of the ileum was normal.                           - Three 3 to 10 mm polyps in the ascending colon,                            removed with a cold snare. Resected and retrieved.                           - Two 4 to 8 mm polyps in the rectum and in the  sigmoid colon, removed with a cold snare. Resected                            and retrieved.                           - Non-bleeding internal hemorrhoids. Recommendation:           - Discharge patient to home (with escort).                           - Await pathology results.                           - The findings and recommendations were discussed                            with the patient. Nicole Kindred "Claire" Page,   Path from Colon: Surgical [P], colon, ascending, polyp (3) - TUBULAR ADENOMA, NEGATIVE FOR HIGH-GRADE DYSPLASIA. - SERRATED MUCOSAL POLYPS WITH MILD BASAL GROWTH ABNORMALITY, FAVOR SESSILE SERRATED POLYPS, NEGATIVE FOR DYSPLASIA. 2. Surgical [P], colon, rectum and sigmoid, polyp (2) - TUBULAR ADENOMAS, NEGATIVE FOR HIGH-GRADE DYSPLASIA  Path from Right Breast Bx 11/2021 Breast, right, needle core biopsy, outer,  9 o'clock, 4cmfn, ribbon clip COMPLEX SCLEROSING LESION ADJACENT FIBROMATOID NODULE AND FIBROCYSTIC CHANGES INCLUDING STROMAL FIBROSIS, ADENOSIS AND USUAL DUCT HYPERPLASIA FOCAL PSEUDOANGIOMATOUS STROMAL HYPERPLASIA (PASH) NEGATIVE FOR MICROCALCIFICATIONS NEGATIVE FOR CARCINOMA 2. Lymph node, needle/core biopsy, right axilla, hydromark spiral clip BENIGN FIBROADIPOSE AND FIBROMUSCULAR STROMA NEGATIVE FOR LYMPH NODE NEGATIVE FOR MALIGNANCY   Assessment & Plan:   Problem List Items Addressed This Visit       Cardiovascular and Mediastinum   Hypertension - Primary    Uncontrolled at this time plan is to start valsartan 320 mg daily continue amlodipine 10 mg daily advised patient take medications consistently      Relevant Medications   atorvastatin (LIPITOR) 40 MG tablet   amLODipine (NORVASC) 10 MG tablet   valsartan (DIOVAN) 320 MG tablet   Other Relevant Orders   Comprehensive metabolic panel   CBC with Differential/Platelet   Recheck vitals     Respiratory   COPD with chronic bronchitis    Continue inhaled medications as prescribed      Relevant Medications   nicotine (NICOTINE STEP 2) 14 mg/24hr patch   nicotine polacrilex (COMMIT) 4 MG lozenge   fluticasone-salmeterol (ADVAIR) 250-50 MCG/ACT AEPB   VENTOLIN HFA 108 (90 Base) MCG/ACT inhaler     Digestive   Gastritis without bleeding    Start pantoprazole daily stop further NSAIDs and focus on Tylenol for pain      Relevant Orders   CBC with Differential/Platelet   RESOLVED: Dental abscess    Resolved with dental care        Other   Hyperlipidemia    Continue with statin      Relevant Medications   atorvastatin (LIPITOR) 40 MG tablet   amLODipine (NORVASC) 10 MG tablet   valsartan (DIOVAN) 320 MG tablet   Other Relevant Orders   Lipid panel   Depression with anxiety   Relevant Medications   sertraline (ZOLOFT) 50 MG tablet   Tobacco use       Current smoking consumption amount: 1/2 pack a  day  Dicsussion on advise  to quit smoking and smoking impacts: Cardiovascular lung impacts  Patient's willingness to quit: Wants to quit  Methods to quit smoking discussed: Nicotine replacement behavioral modification  Medication management of smoking session drugs discussed: Nicotine replacement including both lozenge and patch  Resources provided:  AVS   Setting quit date not established  Follow-up arranged 2 months   Time spent counseling the patient:   5 minutes      Other Visit Diagnoses     Anxiety and depression       Relevant Medications   sertraline (ZOLOFT) 50 MG tablet   gabapentin (NEURONTIN) 100 MG capsule   Bronchospasm       Relevant Medications   VENTOLIN HFA 108 (90 Base) MCG/ACT inhaler       Meds ordered this encounter  Medications   nicotine (NICOTINE STEP 2) 14 mg/24hr patch    Sig: Place 1 patch (14 mg total) onto the skin daily.    Dispense:  28 patch    Refill:  0   nicotine polacrilex (COMMIT) 4 MG lozenge    Sig: Take 1 lozenge (4 mg total) by mouth as needed for smoking cessation.    Dispense:  72 tablet    Refill:  0   sertraline (ZOLOFT) 50 MG tablet    Sig: Take 1 tablet (50 mg total) by mouth daily.    Dispense:  90 tablet    Refill:  2   gabapentin (NEURONTIN) 100 MG capsule    Sig: Take 1 capsule (100 mg total) by mouth in the morning AND 2 capsules (200 mg total) at bedtime.    Dispense:  360 capsule    Refill:  2   fluticasone-salmeterol (ADVAIR) 250-50 MCG/ACT AEPB    Sig: Inhale 1 puff into the lungs in the morning and at bedtime.    Dispense:  60 each    Refill:  1   atorvastatin (LIPITOR) 40 MG tablet    Sig: Take 1 tablet (40 mg total) by mouth daily.    Dispense:  90 tablet    Refill:  2   amLODipine (NORVASC) 10 MG tablet    Sig: Take 1 tablet (10 mg total) by mouth daily.    Dispense:  90 tablet    Refill:  1   valsartan (DIOVAN) 320 MG tablet    Sig: Take 1 tablet (320 mg total) by mouth daily.    Dispense:   90 tablet    Refill:  2   VENTOLIN HFA 108 (90 Base) MCG/ACT inhaler    Sig: Inhale 2 puffs into the lungs 3 (three) times daily as needed.    Dispense:  18 g    Refill:  1   pantoprazole (PROTONIX) 40 MG tablet    Sig: Take 1 tablet (40 mg total) by mouth daily.    Dispense:  30 tablet    Refill:  3  Follow-up: Return in about 2 months (around 02/04/2023) for htn, copd.  30 minutes spent extra time assessing multiple problems  Shan Levans, MD

## 2022-12-05 NOTE — Patient Instructions (Signed)
Start pantoprazole daily for gastritis Stop ibuprofen Take Tylenol to 500 mg every 6 hours as needed for dental pain Start the nicotine patch 14 mg daily and take the nicotine lozenge 4 mg 3 times daily to stop smoking  Complete labs today will be obtained  Return 3 weeks for blood pressure recheck by RN  Return to clinic 2 months

## 2022-12-05 NOTE — Assessment & Plan Note (Signed)
Uncontrolled at this time plan is to start valsartan 320 mg daily continue amlodipine 10 mg daily advised patient take medications consistently

## 2022-12-05 NOTE — Progress Notes (Signed)
No breast cancer seen on new imaging recheck one year

## 2022-12-05 NOTE — Assessment & Plan Note (Signed)
Continue inhaled medications as prescribed 

## 2022-12-05 NOTE — Assessment & Plan Note (Signed)
Start pantoprazole daily stop further NSAIDs and focus on Tylenol for pain

## 2022-12-06 ENCOUNTER — Telehealth: Payer: Self-pay

## 2022-12-06 NOTE — Telephone Encounter (Signed)
-----   Message from Shan Levans sent at 12/06/2022  7:42 AM EDT ----- Let pt know all labs normal

## 2022-12-06 NOTE — Telephone Encounter (Signed)
Pt was called and vm was left, Information has been sent to nurse pool.   

## 2022-12-06 NOTE — Telephone Encounter (Signed)
-----   Message from Shan Levans sent at 12/05/2022  3:38 PM EDT ----- No breast cancer seen on new imaging recheck one year

## 2022-12-31 ENCOUNTER — Ambulatory Visit: Payer: MEDICAID

## 2023-01-01 ENCOUNTER — Ambulatory Visit: Payer: MEDICAID

## 2023-01-02 ENCOUNTER — Other Ambulatory Visit: Payer: Self-pay

## 2023-01-03 ENCOUNTER — Ambulatory Visit: Payer: MEDICAID | Attending: Nurse Practitioner

## 2023-01-03 ENCOUNTER — Other Ambulatory Visit: Payer: Self-pay

## 2023-01-03 NOTE — Progress Notes (Signed)
DOB was confirmed  Patient sat for 5-10 min. Before blood pressure was taken.  Went over labs result as well

## 2023-01-08 ENCOUNTER — Other Ambulatory Visit: Payer: Self-pay | Admitting: Critical Care Medicine

## 2023-01-08 ENCOUNTER — Other Ambulatory Visit: Payer: Self-pay

## 2023-01-08 MED ORDER — ALBUTEROL SULFATE (2.5 MG/3ML) 0.083% IN NEBU
2.5000 mg | INHALATION_SOLUTION | Freq: Four times a day (QID) | RESPIRATORY_TRACT | 1 refills | Status: DC | PRN
  Filled 2023-01-08: qty 150, 13d supply, fill #0
  Filled 2023-03-11: qty 150, 13d supply, fill #1

## 2023-01-09 ENCOUNTER — Other Ambulatory Visit (HOSPITAL_COMMUNITY): Payer: Self-pay

## 2023-01-10 ENCOUNTER — Other Ambulatory Visit: Payer: Self-pay

## 2023-01-14 ENCOUNTER — Other Ambulatory Visit: Payer: Self-pay

## 2023-01-17 ENCOUNTER — Other Ambulatory Visit: Payer: Self-pay

## 2023-01-27 ENCOUNTER — Other Ambulatory Visit: Payer: Self-pay | Admitting: Critical Care Medicine

## 2023-01-28 ENCOUNTER — Other Ambulatory Visit: Payer: Self-pay

## 2023-01-28 ENCOUNTER — Other Ambulatory Visit: Payer: Self-pay | Admitting: Pharmacist

## 2023-01-28 ENCOUNTER — Other Ambulatory Visit: Payer: Self-pay | Admitting: Critical Care Medicine

## 2023-01-28 MED ORDER — FLUTICASONE-SALMETEROL 250-50 MCG/ACT IN AEPB
1.0000 | INHALATION_SPRAY | Freq: Two times a day (BID) | RESPIRATORY_TRACT | 0 refills | Status: DC
  Filled 2023-01-28: qty 60, 30d supply, fill #0

## 2023-01-28 MED ORDER — PANTOPRAZOLE SODIUM 40 MG PO TBEC
40.0000 mg | DELAYED_RELEASE_TABLET | Freq: Every day | ORAL | 0 refills | Status: DC
  Filled 2023-01-28: qty 90, 90d supply, fill #0

## 2023-01-28 NOTE — Telephone Encounter (Signed)
Requested Prescriptions  Pending Prescriptions Disp Refills   fluticasone-salmeterol (ADVAIR DISKUS) 250-50 MCG/ACT AEPB 60 each 0    Sig: Inhale 1 puff into the lungs in the morning and at bedtime.     Pulmonology:  Combination Products Passed - 01/27/2023  9:30 PM      Passed - Valid encounter within last 12 months    Recent Outpatient Visits           1 month ago Primary hypertension   [facility] Storm Frisk, MD   7 months ago COPD exacerbation [facility])   [facility] Storm Frisk, MD   9 months ago Primary hypertension   [facility] Dru[facility]illa Chalet, RPH-CPP   1 year ago Dental abscess   [facility] Storm Frisk, MD   1 year ago Primary hypertension   [facility] Storm Frisk, MD       Future Appointments             In 2 weeks Hoy Register, MD [facility]

## 2023-02-13 ENCOUNTER — Ambulatory Visit: Payer: MEDICAID | Admitting: Family Medicine

## 2023-02-17 ENCOUNTER — Other Ambulatory Visit: Payer: Self-pay | Admitting: Critical Care Medicine

## 2023-02-17 ENCOUNTER — Encounter: Payer: Self-pay | Admitting: Nurse Practitioner

## 2023-02-17 ENCOUNTER — Ambulatory Visit: Payer: MEDICAID | Attending: Critical Care Medicine | Admitting: Nurse Practitioner

## 2023-02-17 ENCOUNTER — Other Ambulatory Visit: Payer: Self-pay

## 2023-02-17 VITALS — BP 148/85 | HR 73 | Ht 66.0 in | Wt 138.8 lb

## 2023-02-17 DIAGNOSIS — J029 Acute pharyngitis, unspecified: Secondary | ICD-10-CM | POA: Diagnosis not present

## 2023-02-17 DIAGNOSIS — J441 Chronic obstructive pulmonary disease with (acute) exacerbation: Secondary | ICD-10-CM | POA: Diagnosis not present

## 2023-02-17 DIAGNOSIS — I10 Essential (primary) hypertension: Secondary | ICD-10-CM | POA: Diagnosis not present

## 2023-02-17 DIAGNOSIS — J9801 Acute bronchospasm: Secondary | ICD-10-CM

## 2023-02-17 LAB — POCT RAPID STREP A (OFFICE): Rapid Strep A Screen: NEGATIVE

## 2023-02-17 MED ORDER — PREDNISONE 20 MG PO TABS
20.0000 mg | ORAL_TABLET | Freq: Every day | ORAL | 0 refills | Status: AC
Start: 2023-02-17 — End: 2023-02-24
  Filled 2023-02-17: qty 7, 7d supply, fill #0

## 2023-02-17 MED ORDER — NYSTATIN 100000 UNIT/ML MT SUSP
5.0000 mL | Freq: Four times a day (QID) | OROMUCOSAL | 0 refills | Status: DC
Start: 2023-02-17 — End: 2023-05-21
  Filled 2023-02-17: qty 60, 3d supply, fill #0

## 2023-02-17 NOTE — Progress Notes (Signed)
Assessment & Plan:  Karen Pham was seen today for medical management of chronic issues and sore throat.  Diagnoses and all orders for this visit:  Sore throat NEGATIVE FOR STREP -     POCT rapid strep A  Primary hypertension Continue all antihypertensives as prescribed.  Reminded to bring in blood pressure log for follow  up appointment.  RECOMMENDATIONS: DASH/Mediterranean Diets are healthier choices for HTN.    COPD exacerbation  Wheezing on physical exam today -     predniSONE (DELTASONE) 20 MG tablet; Take 1 tablet (20 mg total) by mouth daily with breakfast for 7 days. FOR COPD -     nystatin (MYCOSTATIN) 100000 UNIT/ML suspension; Take 5 mLs (500,000 Units total) by mouth 4 (four) times daily. SWISH AND SPIT Call if symptoms do not improve. May need zpak    Patient has been counseled on age-appropriate routine health concerns for screening and prevention. These are reviewed and up-to-date. Referrals have been placed accordingly. Immunizations are up-to-date or declined.    Subjective:   Chief Complaint  Patient presents with   Medical Management of Chronic Issues   Sore Throat   HPI Karen Pham 47 y.o. female presents to office today with complaints of sore throat. She is a patient of Dr. Delford Field.    PMH: COPD, HTN  Notes new onset of sore throat which started 2 days ago. 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 Previous symptoms include fever, chills, runny nose and congestion. She is a smoker and is currently using her inhalers as prescribed.     HTN Blood pressure is currently elevated. She is currently experiencing URI symptoms. Taking valsartan 320 mg and amlodipine 10 mg daily as prescribed.  BP Readings from Last 3 Encounters:  02/17/23 (!) 148/85  01/03/23 124/77  12/05/22 (!) 164/83     Review of Systems  Constitutional:  Negative for fever, malaise/fatigue and weight loss.  HENT:  Positive for congestion and sore  throat. Negative for nosebleeds.   Eyes: Negative.  Negative for blurred vision, double vision and photophobia.  Respiratory:  Positive for cough and shortness of breath.   Cardiovascular: Negative.  Negative for chest pain, palpitations and leg swelling.  Gastrointestinal: Negative.  Negative for heartburn, nausea and vomiting.  Musculoskeletal: Negative.  Negative for myalgias.  Neurological: Negative.  Negative for dizziness, focal weakness, seizures and headaches.  Psychiatric/Behavioral: Negative.  Negative for suicidal ideas.     Past Medical History:  Diagnosis Date   COPD (chronic obstructive pulmonary disease) (HCC)    Dental abscess 01/22/2022   Hypertension    Liver cirrhosis (HCC)    Severe cannabis use disorder (HCC) 11/14/2017    Past Surgical History:  Procedure Laterality Date   BREAST BIOPSY  05/28/2022   MM RT RADIOACTIVE SEED LOC MAMMO GUIDE 05/28/2022 [facility] MAMMOGRAPHY   CARPAL TUNNEL RELEASE Right 07/16/2021   Procedure: RIGHT CARPAL TUNNEL RELEASE;  Surgeon: Marlyne Beards, MD;  Location: [facility];  Service: Orthopedics;  Laterality: Right;  regional with monitored anesthesia care   RADIOACTIVE SEED GUIDED EXCISIONAL BREAST BIOPSY Right 05/29/2022   Procedure: RIGHT BREAST SEED LOCALIZED EXCISIONAL BIOPSY;  Surgeon: Almond Lint, MD;  Location: [facility];  Service: General;  Laterality: Right;    Family History  Problem Relation Age of Onset   Colon cancer Neg Hx    Esophageal cancer Neg Hx    Rectal cancer Neg Hx    Stomach cancer Neg Hx  Social History Reviewed with no changes to be made today.   Outpatient Medications Prior to Visit  Medication Sig Dispense Refill   albuterol (PROVENTIL) (2.5 MG/3ML) 0.083% nebulizer solution Take 3 mLs (2.5 mg total) by nebulization every 6 (six) hours as needed for wheezing or shortness of breath. 150 mL 1   amLODipine (NORVASC) 10 MG tablet Take 1 tablet (10 mg total) by mouth  daily. 90 tablet 1   atorvastatin (LIPITOR) 40 MG tablet Take 1 tablet (40 mg total) by mouth daily. 90 tablet 2   fluticasone-salmeterol (ADVAIR DISKUS) 250-50 MCG/ACT AEPB Inhale 1 puff into the lungs in the morning and at bedtime. 60 each 0   gabapentin (NEURONTIN) 100 MG capsule Take 1 capsule (100 mg total) by mouth in the morning AND 2 capsules (200 mg total) at bedtime. 360 capsule 2   nicotine (NICOTINE STEP 2) 14 mg/24hr patch Place 1 patch (14 mg total) onto the skin daily. 28 patch 0   nicotine polacrilex (COMMIT) 4 MG lozenge Take 1 lozenge (4 mg total) by mouth as needed for smoking cessation. 72 tablet 0   pantoprazole (PROTONIX) 40 MG tablet Take 1 tablet (40 mg total) by mouth daily. 90 tablet 0   sertraline (ZOLOFT) 50 MG tablet Take 1 tablet (50 mg total) by mouth daily. 90 tablet 2   valsartan (DIOVAN) 320 MG tablet Take 1 tablet (320 mg total) by mouth daily. 90 tablet 2   VENTOLIN HFA 108 (90 Base) MCG/ACT inhaler Inhale 2 puffs into the lungs 3 (three) times daily as needed. 18 g 1   No facility-administered medications prior to visit.    Allergies  Allergen Reactions   Latex Hives   Sulfa Antibiotics Hives       Objective:    BP (!) 148/85   Pulse 73   Wt 138 lb 12.8 oz (63 kg)   SpO2 95%   BMI 22.40 kg/m  Wt Readings from Last 3 Encounters:  02/17/23 138 lb 12.8 oz (63 kg)  12/05/22 138 lb 6.4 oz (62.8 kg)  06/04/22 138 lb 3.2 oz (62.7 kg)    Physical Exam Vitals and nursing note reviewed.  Constitutional:      Appearance: She is well-developed.  HENT:     Head: Normocephalic and atraumatic.     Mouth/Throat:     Palate: No mass and lesions.     Pharynx: No pharyngeal swelling, oropharyngeal exudate, posterior oropharyngeal erythema, uvula swelling or postnasal drip.     Tonsils: No tonsillar exudate or tonsillar abscesses.  Cardiovascular:     Rate and Rhythm: Normal rate and regular rhythm.     Heart sounds: Normal heart sounds. No murmur  heard.    No friction rub. No gallop.  Pulmonary:     Effort: Pulmonary effort is normal. No tachypnea or respiratory distress.     Breath sounds: Wheezing present. No decreased breath sounds, rhonchi or rales.  Chest:     Chest wall: No tenderness.  Abdominal:     General: Bowel sounds are normal.     Palpations: Abdomen is soft.  Musculoskeletal:        General: Normal range of motion.     Cervical back: Normal range of motion.  Skin:    General: Skin is warm and dry.  Neurological:     Mental Status: She is alert and oriented to person, place, and time.     Coordination: Coordination normal.  Psychiatric:  Behavior: Behavior normal. Behavior is cooperative.        Thought Content: Thought content normal.        Judgment: Judgment normal.          Patient has been counseled extensively about nutrition and exercise as well as the importance of adherence with medications and regular follow-up. The patient was given clear instructions to go to ER or return to medical center if symptoms don't improve, worsen or new problems develop. The patient verbalized understanding.   Follow-up: Return in about 3 months (around 05/20/2023) for HTN.   Claiborne Rigg, FNP-BC [facility] [place], [place] [phone]   03/03/2023, 9:16 PM

## 2023-02-18 ENCOUNTER — Other Ambulatory Visit: Payer: Self-pay

## 2023-02-18 MED ORDER — VENTOLIN HFA 108 (90 BASE) MCG/ACT IN AERS
2.0000 | INHALATION_SPRAY | Freq: Three times a day (TID) | RESPIRATORY_TRACT | 0 refills | Status: DC | PRN
  Filled 2023-02-18: qty 18, 33d supply, fill #0

## 2023-02-18 NOTE — Telephone Encounter (Signed)
Requested Prescriptions  Pending Prescriptions Disp Refills   VENTOLIN HFA 108 (90 Base) MCG/ACT inhaler 18 g 0    Sig: Inhale 2 puffs into the lungs 3 (three) times daily as needed.     Pulmonology:  Beta Agonists 2 Failed - 02/17/2023 10:12 PM      Failed - Last BP in normal range    BP Readings from Last 1 Encounters:  02/17/23 (!) 148/85         Passed - Last Heart Rate in normal range    Pulse Readings from Last 1 Encounters:  02/17/23 73         Passed - Valid encounter within last 12 months    Recent Outpatient Visits           Yesterday COPD exacerbation [facility])   [facility], Shea Stakes, NP   2 months ago Primary hypertension   [place] Storm Frisk, MD   8 months ago COPD exacerbation [place])   [facility] Storm Frisk, MD   10 months ago Primary hypertension   [facility] Drucilla Chalet, RPH-CPP   1 year ago Dental abscess   [facility] Storm Frisk, MD       Future Appointments             In 3 months Claiborne Rigg, NP [facility]

## 2023-02-19 ENCOUNTER — Other Ambulatory Visit: Payer: Self-pay

## 2023-02-26 ENCOUNTER — Other Ambulatory Visit: Payer: Self-pay | Admitting: Critical Care Medicine

## 2023-02-26 ENCOUNTER — Other Ambulatory Visit: Payer: Self-pay | Admitting: Physician Assistant

## 2023-02-26 ENCOUNTER — Ambulatory Visit: Payer: Self-pay

## 2023-02-26 DIAGNOSIS — J9801 Acute bronchospasm: Secondary | ICD-10-CM

## 2023-02-26 NOTE — Telephone Encounter (Signed)
Patient schedule VV for tomorrow.

## 2023-02-26 NOTE — Telephone Encounter (Signed)
      Chief Complaint: Seen 02/17/23 with upper respiratory symptoms. Still has cough, runny nose, SOB. Asking if she can have more Prednisone and an "allergy pill." Will come in if she has to. Symptoms: Above Frequency: 02/17/23 Pertinent Negatives: Patient denies fever Disposition: [] ED /[] Urgent Care (no appt availability in office) / [] Appointment(In office/virtual)/ []  [place] [facility]/ [] Home Care/ [x] Refused Recommended Disposition /[] [place] [facility]/ [x]  Follow-up with PCP Additional Notes: Please advise pt.  Reason for Disposition  [1] Longstanding difficulty breathing (e.g., CHF, COPD, emphysema) AND [2] WORSE than normal  Answer Assessment - Initial Assessment Questions 1. RESPIRATORY STATUS: "Describe your breathing?" (e.g., wheezing, shortness of breath, unable to speak, severe coughing)      SOB, cough 2. ONSET: "When did this breathing problem begin?"      2 weeks 3. PATTERN "Does the difficult breathing come and go, or has it been constant since it started?"      Constant 4. SEVERITY: "How bad is your breathing?" (e.g., mild, moderate, severe)    - MILD: No SOB at rest, mild SOB with walking, speaks normally in sentences, can lie down, no retractions, pulse < 100.    - MODERATE: SOB at rest, SOB with minimal exertion and prefers to sit, cannot lie down flat, speaks in phrases, mild retractions, audible wheezing, pulse 100-120.    - SEVERE: Very SOB at rest, speaks in single words, struggling to breathe, sitting hunched forward, retractions, pulse > 120      Mild-moderate 5. RECURRENT SYMPTOM: "Have you had difficulty breathing before?" If Yes, ask: "When was the last time?" and "What happened that time?"      Yes 6. CARDIAC HISTORY: "Do you have any history of heart disease?" (e.g., heart attack, angina, bypass surgery, angioplasty)      No 7. LUNG HISTORY: "Do you have any history of lung disease?"  (e.g., pulmonary embolus, asthma, emphysema)      COPD 8. CAUSE: "What do you think is causing the breathing problem?"      Unsure 9. OTHER SYMPTOMS: "Do you have any other symptoms? (e.g., dizziness, runny nose, cough, chest pain, fever)     Runny nose 10. O2 SATURATION MONITOR:  "Do you use an oxygen saturation monitor (pulse oximeter) at home?" If Yes, ask: "What is your reading (oxygen level) today?" "What is your usual oxygen saturation reading?" (e.g., 95%)       No 11. PREGNANCY: "Is there any chance you are pregnant?" "When was your last menstrual period?"       No 12. TRAVEL: "Have you traveled out of the country in the last month?" (e.g., travel history, exposures)       No  Protocols used: Breathing Difficulty-A-AH

## 2023-02-26 NOTE — Telephone Encounter (Signed)
Call placed to patient unable to reach message left on VM.   

## 2023-02-27 ENCOUNTER — Encounter: Payer: Self-pay | Admitting: Family Medicine

## 2023-02-27 ENCOUNTER — Other Ambulatory Visit: Payer: Self-pay

## 2023-02-27 ENCOUNTER — Telehealth: Payer: MEDICAID | Admitting: Family Medicine

## 2023-02-27 DIAGNOSIS — J9801 Acute bronchospasm: Secondary | ICD-10-CM | POA: Diagnosis not present

## 2023-02-27 MED ORDER — AMOXICILLIN-POT CLAVULANATE 875-125 MG PO TABS
1.0000 | ORAL_TABLET | Freq: Two times a day (BID) | ORAL | 0 refills | Status: DC
  Filled 2023-02-27: qty 20, 10d supply, fill #0

## 2023-02-27 MED ORDER — VENTOLIN HFA 108 (90 BASE) MCG/ACT IN AERS
2.0000 | INHALATION_SPRAY | Freq: Three times a day (TID) | RESPIRATORY_TRACT | 3 refills | Status: DC | PRN
  Filled 2023-02-27: qty 2, fill #0
  Filled 2023-02-27 – 2023-03-14 (×3): qty 36, 66d supply, fill #0
  Filled 2023-05-16: qty 36, 66d supply, fill #1
  Filled 2023-07-16: qty 36, 66d supply, fill #2
  Filled 2023-10-01: qty 36, 66d supply, fill #3

## 2023-02-27 MED ORDER — BENZONATATE 100 MG PO CAPS
100.0000 mg | ORAL_CAPSULE | Freq: Two times a day (BID) | ORAL | 0 refills | Status: DC | PRN
Start: 2023-02-27 — End: 2023-08-18
  Filled 2023-02-27: qty 20, 10d supply, fill #0

## 2023-02-27 NOTE — Progress Notes (Signed)
Virtual Visit via Video Note  I connected with Karen Pham, on 02/27/2023 at 8:20 AM by video enabled telemedicine device and verified that I am speaking with the correct person using two identifiers.   Consent: I discussed the limitations, risks, security and privacy concerns of performing an evaluation and management service by telemedicine and the availability of in person appointments. I also discussed with the patient that there may be a patient responsible charge related to this service. The patient expressed understanding and agreed to proceed.   Location of Patient: [place] of Provider: Clinic   Persons participating in Telemedicine visit: Karen Pham Dr. Alvis Lemmings     History of Present Illness: Karen Pham is a 47 y.o. year old female  patient with history of COPD, hypertension seen for an office visit. Discussed the use of AI scribe software for clinical note transcription with the patient, who gave verbal consent to proceed.   The patient, with a history of COPD, presents with persistent respiratory symptoms despite a recent course of prednisone, 10 days ago. She describes a runny nose, coughing productive of phlegm, and a sensation of pressure in the chest, making it hard to breathe. The symptoms started with a sore throat and have been ongoing for approximately three weeks. The patient has been using a nebulizer twice daily and an inhaler frequently. She has also tried over-the-counter remedies without relief. The patient denies any pain in the cheekbones or forehead and has not had a headache in over twenty years. She does experience body aches and joint pains, which she attributes to her work and age. She denies any recent exposure to sick contacts.        Past Medical History:  Diagnosis Date   COPD (chronic obstructive pulmonary disease) (HCC)    Dental abscess 01/22/2022   Hypertension    Liver cirrhosis (HCC)    Severe cannabis use disorder (HCC)  11/14/2017   Allergies  Allergen Reactions   Latex Hives   Sulfa Antibiotics Hives    Current Outpatient Medications on File Prior to Visit  Medication Sig Dispense Refill   albuterol (PROVENTIL) (2.5 MG/3ML) 0.083% nebulizer solution Take 3 mLs (2.5 mg total) by nebulization every 6 (six) hours as needed for wheezing or shortness of breath. 150 mL 1   amLODipine (NORVASC) 10 MG tablet Take 1 tablet (10 mg total) by mouth daily. 90 tablet 1   atorvastatin (LIPITOR) 40 MG tablet Take 1 tablet (40 mg total) by mouth daily. 90 tablet 2   fluticasone-salmeterol (ADVAIR DISKUS) 250-50 MCG/ACT AEPB Inhale 1 puff into the lungs in the morning and at bedtime. 60 each 0   gabapentin (NEURONTIN) 100 MG capsule Take 1 capsule (100 mg total) by mouth in the morning AND 2 capsules (200 mg total) at bedtime. 360 capsule 2   nicotine (NICOTINE STEP 2) 14 mg/24hr patch Place 1 patch (14 mg total) onto the skin daily. 28 patch 0   nicotine polacrilex (COMMIT) 4 MG lozenge Take 1 lozenge (4 mg total) by mouth as needed for smoking cessation. 72 tablet 0   nystatin (MYCOSTATIN) 100000 UNIT/ML suspension Take 5 mLs (500,000 Units total) by mouth 4 (four) times daily. SWISH AND SPIT 60 mL 0   pantoprazole (PROTONIX) 40 MG tablet Take 1 tablet (40 mg total) by mouth daily. 90 tablet 0   sertraline (ZOLOFT) 50 MG tablet Take 1 tablet (50 mg total) by mouth daily. 90 tablet 2   valsartan (DIOVAN) 320 MG tablet  Take 1 tablet (320 mg total) by mouth daily. 90 tablet 2   VENTOLIN HFA 108 (90 Base) MCG/ACT inhaler Inhale 2 puffs into the lungs 3 (three) times daily as needed. 18 g 0   No current facility-administered medications on file prior to visit.    ROS: See HPI  Observations/Objective: Awake, alert, oriented x3 Not in acute distress Tenderness on percussion of sinuses Normal mood      Latest Ref Rng & Units 12/05/2022   11:51 AM 04/04/2022   11:21 AM 02/11/2022    1:13 PM  CMP  Glucose 70 - 99  mg/dL 82  89  865   BUN 6 - 24 mg/dL 10  18  15    Creatinine 0.57 - 1.00 mg/dL 7.84  6.96  2.95   Sodium 134 - 144 mmol/L 139  138  134   Potassium 3.5 - 5.2 mmol/L 4.3  4.6  3.0   Chloride 96 - 106 mmol/L 105  102  99   CO2 20 - 29 mmol/L 22  23  25    Calcium 8.7 - 10.2 mg/dL 9.4  9.8  9.4   Total Protein 6.0 - 8.5 g/dL 6.4  6.6    Total Bilirubin 0.0 - 1.2 mg/dL 0.2  0.3    Alkaline Phos 44 - 121 IU/L 61  61    AST 0 - 40 IU/L 13  18    ALT 0 - 32 IU/L 11  11      Lipid Panel     Component Value Date/Time   CHOL 128 12/05/2022 1151   TRIG 80 12/05/2022 1151   HDL 43 12/05/2022 1151   CHOLHDL 3.0 12/05/2022 1151   LDLCALC 69 12/05/2022 1151   LABVLDL 16 12/05/2022 1151    No results found for: "HGBA1C"   Assessment and Plan:     COPD exacerbation Persistent symptoms despite prednisone treatment. Increased use of albuterol and nebulizer. Reports pressure in chest and increased cough with phlegm. -Start Augmentin therapy for possible bacterial infection. -Prescribe cough medication. -Refill albuterol inhaler and allow for two dispensations for convenience at work and home.  General Health Maintenance -Encourage consistent use of Breo inhaler.        Follow Up Instructions: Keep previously scheduled appointment   I discussed the assessment and treatment plan with the patient. The patient was provided an opportunity to ask questions and all were answered. The patient agreed with the plan and demonstrated an understanding of the instructions.   The patient was advised to call back or seek an in-person evaluation if the symptoms worsen or if the condition fails to improve as anticipated.     I provided 15 minutes total of Telehealth time during this encounter including median intraservice time, reviewing previous notes, investigations, ordering medications, medical decision making, coordinating care and patient verbalized understanding at the end of the  visit.     Hoy Register, MD, FAAFP. [facility] [place], [place] [phone]   02/27/2023, 8:20 AM

## 2023-02-27 NOTE — Patient Instructions (Signed)
 Chronic Obstructive Pulmonary Disease Exacerbation  Chronic obstructive pulmonary disease (COPD) is a long-term (chronic) lung problem. When you have COPD, it can feel harder to breathe in or out. COPD exacerbation is a flare-up of symptoms when breathing gets worse and more treatment may be needed. Without treatment, flare-ups can be life-threatening. If they happen often, your lungs can become more damaged. What are the causes? Not taking your usual COPD medicines as told by your health care provider. A cold or the flu, which can cause infection in your lungs. Being exposed to things that make your breathing worse, such as: Smoke. Air pollution. Fumes. Dust. Allergies. Weather changes. What are the signs or symptoms? Symptoms do not get better or get worse even if you take your medicines as told by your provider. Symptoms may include: More shortness of breath. You may only be able to speak one or two words at a time. More coughing or mucus from your lungs. More wheezing or chest tightness. Being more tired and having less energy. Confusion. How is this diagnosed? This condition is diagnosed based on: Symptoms that get worse. Your medical history. A physical exam. You may also have tests, including: A chest X-ray. Blood or mucus tests. How is this treated? You may be able to stay home or you may need to go to the hospital. Treatment may include: Taking medicines. These may include: Inhalers. These have medicines in them that you breathe in. These may be more of what you already take or they may be new. Steroids. These reduce inflammation in the airways. These may be inhaled, taken by mouth, or given in an IV. Antibiotics. These treat infection. Using oxygen. Using a device to help you clear mucus. Follow these instructions at home: Medicines Take your medicines only as told by your provider. If you were given antibiotics or steroids, take them as told by your provider. Do  not stop taking them even if you start to feel better. Lifestyle Several times a day, wash your hands with soap and water for at least 20 seconds. If you cannot use soap and water, use hand sanitizer. This may help keep you from getting an infection. Avoid being around crowds or people who are sick. Do not smoke or use any products that contain nicotine or tobacco. If you need help quitting, ask your provider. Return to your normal activities when your provider says that it's safe. Use breathing methods to control your stress and catch your breath. How is this prevented? Follow your COPD action plan. The action plan tells you what to do if you're feeling good and what to do when you start feeling worse. Discuss the plan often with your provider. Make sure you get all the shots, also called vaccines, that your provider recommends. Ask your provider about a flu shot and a pneumonia shot. Use oxygen therapy if told by your provider. If you need home oxygen therapy, ask your provider how often to check your oxygen level with a device called an oximeter. Keep all follow-up visits to review your COPD action plan. Your provider will want to check on your condition often to keep you healthy and out of the hospital. Contact a health care provider if: Your COPD symptoms get worse. You have a fever or chills. You have trouble doing daily activities. You have trouble breathing even when you are resting. Get help right away if: You are short of breath and cannot: Talk in full sentences. Do normal activities. You have chest  pain. You feel confused. These symptoms may be an emergency. Call 911 right away. Do not wait to see if the symptoms will go away. Do not drive yourself to the hospital. This information is not intended to replace advice given to you by your health care provider. Make sure you discuss any questions you have with your health care provider. Document Revised: 07/08/2022 Document  Reviewed: 07/08/2022 Elsevier Patient Education  2024 [employer].

## 2023-03-03 ENCOUNTER — Encounter: Payer: Self-pay | Admitting: Nurse Practitioner

## 2023-03-06 ENCOUNTER — Other Ambulatory Visit: Payer: Self-pay

## 2023-03-11 ENCOUNTER — Other Ambulatory Visit: Payer: Self-pay | Admitting: Critical Care Medicine

## 2023-03-12 ENCOUNTER — Other Ambulatory Visit: Payer: Self-pay

## 2023-03-12 MED ORDER — FLUTICASONE-SALMETEROL 250-50 MCG/ACT IN AEPB
1.0000 | INHALATION_SPRAY | Freq: Two times a day (BID) | RESPIRATORY_TRACT | 3 refills | Status: DC
  Filled 2023-03-12: qty 120, 60d supply, fill #0
  Filled 2023-07-16: qty 120, 60d supply, fill #1

## 2023-03-13 ENCOUNTER — Other Ambulatory Visit: Payer: Self-pay

## 2023-03-14 ENCOUNTER — Other Ambulatory Visit: Payer: Self-pay

## 2023-03-17 ENCOUNTER — Other Ambulatory Visit: Payer: Self-pay

## 2023-04-08 ENCOUNTER — Other Ambulatory Visit: Payer: Self-pay | Admitting: Critical Care Medicine

## 2023-04-09 ENCOUNTER — Other Ambulatory Visit: Payer: Self-pay

## 2023-04-09 MED ORDER — ALBUTEROL SULFATE (2.5 MG/3ML) 0.083% IN NEBU
2.5000 mg | INHALATION_SOLUTION | Freq: Four times a day (QID) | RESPIRATORY_TRACT | 0 refills | Status: DC | PRN
  Filled 2023-04-09: qty 180, 15d supply, fill #0

## 2023-04-16 ENCOUNTER — Ambulatory Visit: Payer: MEDICAID | Admitting: Nurse Practitioner

## 2023-04-21 ENCOUNTER — Other Ambulatory Visit: Payer: Self-pay

## 2023-04-21 ENCOUNTER — Other Ambulatory Visit: Payer: Self-pay | Admitting: Critical Care Medicine

## 2023-04-21 MED ORDER — PANTOPRAZOLE SODIUM 40 MG PO TBEC
40.0000 mg | DELAYED_RELEASE_TABLET | Freq: Every day | ORAL | 0 refills | Status: DC
  Filled 2023-04-21: qty 90, 90d supply, fill #0

## 2023-05-08 ENCOUNTER — Other Ambulatory Visit: Payer: Self-pay

## 2023-05-08 ENCOUNTER — Other Ambulatory Visit: Payer: Self-pay | Admitting: Family Medicine

## 2023-05-08 MED ORDER — ALBUTEROL SULFATE (2.5 MG/3ML) 0.083% IN NEBU
2.5000 mg | INHALATION_SOLUTION | Freq: Four times a day (QID) | RESPIRATORY_TRACT | 0 refills | Status: DC | PRN
  Filled 2023-05-08: qty 180, 15d supply, fill #0

## 2023-05-09 ENCOUNTER — Other Ambulatory Visit: Payer: Self-pay

## 2023-05-19 ENCOUNTER — Other Ambulatory Visit: Payer: Self-pay

## 2023-05-20 ENCOUNTER — Encounter: Payer: Self-pay | Admitting: Nurse Practitioner

## 2023-05-20 ENCOUNTER — Ambulatory Visit: Payer: MEDICAID | Attending: Nurse Practitioner | Admitting: Nurse Practitioner

## 2023-05-20 ENCOUNTER — Other Ambulatory Visit: Payer: Self-pay

## 2023-05-20 VITALS — BP 127/77 | HR 74 | Resp 19 | Ht 66.0 in | Wt 138.8 lb

## 2023-05-20 DIAGNOSIS — J4489 Other specified chronic obstructive pulmonary disease: Secondary | ICD-10-CM

## 2023-05-20 DIAGNOSIS — I10 Essential (primary) hypertension: Secondary | ICD-10-CM

## 2023-05-20 DIAGNOSIS — K219 Gastro-esophageal reflux disease without esophagitis: Secondary | ICD-10-CM | POA: Diagnosis not present

## 2023-05-20 DIAGNOSIS — F17200 Nicotine dependence, unspecified, uncomplicated: Secondary | ICD-10-CM

## 2023-05-20 DIAGNOSIS — B370 Candidal stomatitis: Secondary | ICD-10-CM

## 2023-05-20 MED ORDER — ALBUTEROL SULFATE (2.5 MG/3ML) 0.083% IN NEBU
2.5000 mg | INHALATION_SOLUTION | Freq: Four times a day (QID) | RESPIRATORY_TRACT | 0 refills | Status: DC | PRN
  Filled 2023-05-20: qty 180, 15d supply, fill #0

## 2023-05-20 MED ORDER — VALSARTAN 320 MG PO TABS
320.0000 mg | ORAL_TABLET | Freq: Every day | ORAL | 2 refills | Status: AC
  Filled 2023-12-05: qty 90, 90d supply, fill #0

## 2023-05-20 MED ORDER — AMLODIPINE BESYLATE 10 MG PO TABS
10.0000 mg | ORAL_TABLET | Freq: Every day | ORAL | 1 refills | Status: DC
  Filled 2023-05-20 – 2023-06-14 (×2): qty 90, 90d supply, fill #0
  Filled 2023-10-01: qty 90, 90d supply, fill #1

## 2023-05-20 MED ORDER — BUDESONIDE-FORMOTEROL FUMARATE 160-4.5 MCG/ACT IN AERO
2.0000 | INHALATION_SPRAY | Freq: Two times a day (BID) | RESPIRATORY_TRACT | 3 refills | Status: DC
  Filled 2023-05-20: qty 10.2, 30d supply, fill #0
  Filled 2023-06-14: qty 10.2, 30d supply, fill #1
  Filled 2023-07-15: qty 10.2, 30d supply, fill #2
  Filled 2023-08-18: qty 10.2, 30d supply, fill #3

## 2023-05-20 MED ORDER — NICOTINE 14 MG/24HR TD PT24
14.0000 mg | MEDICATED_PATCH | Freq: Every day | TRANSDERMAL | 0 refills | Status: AC
  Filled 2023-05-20: qty 28, 28d supply, fill #0

## 2023-05-20 MED ORDER — PANTOPRAZOLE SODIUM 40 MG PO TBEC
40.0000 mg | DELAYED_RELEASE_TABLET | Freq: Every day | ORAL | 0 refills | Status: DC
  Filled 2023-05-20 – 2023-08-18 (×2): qty 90, 90d supply, fill #0

## 2023-05-20 NOTE — Progress Notes (Signed)
 Assessment & Plan:  Karen Pham was seen today for hypertension and copd.  Diagnoses and all orders for this visit:  Primary hypertension -     amLODipine  (NORVASC ) 10 MG tablet; Take 1 tablet (10 mg total) by mouth daily. -     valsartan  (DIOVAN ) 320 MG tablet; Take 1 tablet (320 mg total) by mouth daily. -     CMP14+EGFR Patient has been counseled on age-appropriate routine health concerns for screening and prevention. These are reviewed and up-to-date. Referrals have been placed accordingly. Immunizations are up-to-date or declined.      COPD with chronic bronchitis (HCC) -     albuterol  (PROVENTIL ) (2.5 MG/3ML) 0.083% nebulizer solution; Take 3 mLs (2.5 mg total) by nebulization every 6 (six) hours as needed for wheezing or shortness of breath. -     nicotine  (NICOTINE  STEP 2) 14 mg/24hr patch; Place 1 patch (14 mg total) onto the skin daily. -     budesonide -formoterol  (SYMBICORT ) 160-4.5 MCG/ACT inhaler; Inhale 2 puffs into the lungs 2 (two) times daily. -     CT Chest Wo Contrast; Future  GERD without esophagitis Well controlled -     pantoprazole  (PROTONIX ) 40 MG tablet; Take 1 tablet (40 mg total) by mouth daily. INSTRUCTIONS: Avoid GERD Triggers: acidic, spicy or fried foods, caffeine, coffee, sodas,  alcohol and chocolate.    Tobacco dependence -     CT Chest Wo Contrast; Future  Oral candidiasis -     nystatin  (MYCOSTATIN ) 100000 UNIT/ML suspension; Take 5 mLs (500,000 Units total) by mouth 4 (four) times daily. SWISH AND SPIT. As needed for yeast or thrush in mouth    Patient has been counseled on age-appropriate routine health concerns for screening and prevention. These are reviewed and up-to-date. Referrals have been placed accordingly. Immunizations are up-to-date or declined.    Subjective:   Chief Complaint  Patient presents with   Hypertension   COPD    Karen Pham 48 y.o. female presents to office today for follow up to HTN She is a previous patient of  Dr. Brien.   She has a past medical history of COPD, Dental abscess (01/22/2022), Hypertension, Liver cirrhosis (HCC), and Severe cannabis use disorder (HCC) (11/14/2017).   Patient has been counseled on age-appropriate routine health concerns for screening and prevention. These are reviewed and up-to-date. Referrals have been placed accordingly. Immunizations are up-to-date or declined.       HTN BP at goal today.  BP Readings from Last 3 Encounters:  05/20/23 127/77  02/17/23 (!) 148/85  01/03/23 124/77     She started smoking at the age of 30. She was smoking up to 2ppd until 3 years ago. Now she smokes less than half PPD. She is using her nebs several times per day. Currently prescribed advair  and administers 1 puff BID. Does not feel it is 100% effective.    Review of Systems  Constitutional:  Negative for fever, malaise/fatigue and weight loss.  HENT: Negative.  Negative for nosebleeds.   Eyes: Negative.  Negative for blurred vision, double vision and photophobia.  Respiratory: Negative.  Negative for cough and shortness of breath.   Cardiovascular: Negative.  Negative for chest pain, palpitations and leg swelling.  Gastrointestinal: Negative.  Negative for heartburn, nausea and vomiting.  Musculoskeletal: Negative.  Negative for myalgias.  Neurological: Negative.  Negative for dizziness, focal weakness, seizures and headaches.  Psychiatric/Behavioral: Negative.  Negative for suicidal ideas.     Past Medical History:  Diagnosis Date  COPD (chronic obstructive pulmonary disease) (HCC)    Dental abscess 01/22/2022   Hypertension    Liver cirrhosis (HCC)    Severe cannabis use disorder (HCC) 11/14/2017    Past Surgical History:  Procedure Laterality Date   BREAST BIOPSY  05/28/2022   MM RT RADIOACTIVE SEED LOC MAMMO GUIDE 05/28/2022 [facility]   CARPAL TUNNEL RELEASE Right 07/16/2021   Procedure: RIGHT CARPAL TUNNEL RELEASE;  Surgeon: Romona Harari, MD;   Location: [facility];  Service: Orthopedics;  Laterality: Right;  regional with monitored anesthesia care   RADIOACTIVE SEED GUIDED EXCISIONAL BREAST BIOPSY Right 05/29/2022   Procedure: RIGHT BREAST SEED LOCALIZED EXCISIONAL BIOPSY;  Surgeon: Aron Shoulders, MD;  Location: [facility];  Service: General;  Laterality: Right;    Family History  Problem Relation Age of Onset   Colon cancer Neg Hx    Esophageal cancer Neg Hx    Rectal cancer Neg Hx    Stomach cancer Neg Hx     Social History Reviewed with no changes to be made today.   Outpatient Medications Prior to Visit  Medication Sig Dispense Refill   atorvastatin  (LIPITOR) 40 MG tablet Take 1 tablet (40 mg total) by mouth daily. 90 tablet 2   benzonatate  (TESSALON ) 100 MG capsule Take 1 capsule (100 mg total) by mouth 2 (two) times daily as needed for cough. 20 capsule 0   fluticasone -salmeterol (ADVAIR  DISKUS) 250-50 MCG/ACT AEPB Inhale 1 puff into the lungs in the morning and at bedtime. 60 each 3   gabapentin  (NEURONTIN ) 100 MG capsule Take 1 capsule (100 mg total) by mouth in the morning AND 2 capsules (200 mg total) at bedtime. 360 capsule 2   nicotine  polacrilex (COMMIT) 4 MG lozenge Take 1 lozenge (4 mg total) by mouth as needed for smoking cessation. 72 tablet 0   sertraline  (ZOLOFT ) 50 MG tablet Take 1 tablet (50 mg total) by mouth daily. 90 tablet 2   VENTOLIN  HFA 108 (90 Base) MCG/ACT inhaler Inhale 2 puffs into the lungs 3 (three) times daily as needed. 36 g 3   albuterol  (PROVENTIL ) (2.5 MG/3ML) 0.083% nebulizer solution Take 3 mLs (2.5 mg total) by nebulization every 6 (six) hours as needed for wheezing or shortness of breath. 180 mL 0   amLODipine  (NORVASC ) 10 MG tablet Take 1 tablet (10 mg total) by mouth daily. 90 tablet 1   nicotine  (NICOTINE  STEP 2) 14 mg/24hr patch Place 1 patch (14 mg total) onto the skin daily. 28 patch 0   pantoprazole  (PROTONIX ) 40 MG tablet Take 1 tablet (40 mg total)  by mouth daily. 90 tablet 0   valsartan  (DIOVAN ) 320 MG tablet Take 1 tablet (320 mg total) by mouth daily. 90 tablet 2   amoxicillin -clavulanate (AUGMENTIN ) 875-125 MG tablet Take 1 tablet by mouth 2 (two) times daily. (Patient not taking: Reported on 05/20/2023) 20 tablet 0   nystatin  (MYCOSTATIN ) 100000 UNIT/ML suspension Take 5 mLs (500,000 Units total) by mouth 4 (four) times daily. SWISH AND SPIT (Patient not taking: Reported on 05/20/2023) 60 mL 0   No facility-administered medications prior to visit.    Allergies  Allergen Reactions   Latex Hives   Sulfa Antibiotics Hives       Objective:    BP 127/77 (BP Location: Left Arm, Patient Position: Sitting, Cuff Size: Normal)   Pulse 74   Resp 19   Ht 5' 6 (1.676 m)   Wt 138 lb 12.8 oz (63 kg)  SpO2 98%   BMI 22.40 kg/m  Wt Readings from Last 3 Encounters:  05/20/23 138 lb 12.8 oz (63 kg)  02/17/23 138 lb 12.8 oz (63 kg)  12/05/22 138 lb 6.4 oz (62.8 kg)    Physical Exam Vitals and nursing note reviewed.  Constitutional:      Appearance: She is well-developed.  HENT:     Head: Normocephalic and atraumatic.     Mouth/Throat:     Dentition: Abnormal dentition.  Cardiovascular:     Rate and Rhythm: Normal rate and regular rhythm.     Heart sounds: Normal heart sounds. No murmur heard.    No friction rub. No gallop.  Pulmonary:     Effort: Pulmonary effort is normal. No tachypnea or respiratory distress.     Breath sounds: Normal breath sounds. No decreased breath sounds, wheezing, rhonchi or rales.  Chest:     Chest wall: No tenderness.  Abdominal:     General: Bowel sounds are normal.     Palpations: Abdomen is soft.  Musculoskeletal:        General: Normal range of motion.     Cervical back: Normal range of motion.  Skin:    General: Skin is warm and dry.  Neurological:     Mental Status: She is alert and oriented to person, place, and time.     Coordination: Coordination normal.  Psychiatric:         Behavior: Behavior normal. Behavior is cooperative.        Thought Content: Thought content normal.        Judgment: Judgment normal.          Patient has been counseled extensively about nutrition and exercise as well as the importance of adherence with medications and regular follow-up. The patient was given clear instructions to go to ER or return to medical center if symptoms don't improve, worsen or new problems develop. The patient verbalized understanding.   Follow-up: Return in about 3 months (around 08/18/2023).   Haze LELON Servant, FNP-BC [facility] [place], [place] [phone]   05/21/2023, 9:33 AM

## 2023-05-20 NOTE — Patient Instructions (Signed)
[facility] [address]  [phone] Open ? Closes 7?PM

## 2023-05-21 ENCOUNTER — Other Ambulatory Visit: Payer: Self-pay

## 2023-05-21 LAB — CMP14+EGFR
ALT: 12 [IU]/L (ref 0–32)
AST: 23 [IU]/L (ref 0–40)
Albumin: 4.6 g/dL (ref 3.9–4.9)
Alkaline Phosphatase: 53 [IU]/L (ref 44–121)
BUN/Creatinine Ratio: 10 (ref 9–23)
BUN: 10 mg/dL (ref 6–24)
Bilirubin Total: 0.3 mg/dL (ref 0.0–1.2)
CO2: 22 mmol/L (ref 20–29)
Calcium: 9.8 mg/dL (ref 8.7–10.2)
Chloride: 104 mmol/L (ref 96–106)
Creatinine, Ser: 0.96 mg/dL (ref 0.57–1.00)
Globulin, Total: 2.2 g/dL (ref 1.5–4.5)
Glucose: 76 mg/dL (ref 70–99)
Potassium: 4.2 mmol/L (ref 3.5–5.2)
Sodium: 142 mmol/L (ref 134–144)
Total Protein: 6.8 g/dL (ref 6.0–8.5)
eGFR: 73 mL/min/{1.73_m2} (ref >59)

## 2023-05-21 MED ORDER — NYSTATIN 100000 UNIT/ML MT SUSP
5.0000 mL | Freq: Four times a day (QID) | OROMUCOSAL | 1 refills | Status: AC
  Filled 2023-05-21 – 2023-06-14 (×2): qty 60, 3d supply, fill #0

## 2023-05-30 ENCOUNTER — Other Ambulatory Visit: Payer: Self-pay

## 2023-06-04 ENCOUNTER — Encounter: Payer: Self-pay | Admitting: Nurse Practitioner

## 2023-06-05 ENCOUNTER — Encounter: Payer: Self-pay | Admitting: Nurse Practitioner

## 2023-06-09 ENCOUNTER — Ambulatory Visit
Admission: RE | Admit: 2023-06-09 | Discharge: 2023-06-09 | Disposition: A | Discharge status: Home / Self Care | Payer: MEDICAID | Source: Ambulatory Visit | Attending: Nurse Practitioner | Admitting: Nurse Practitioner

## 2023-06-09 DIAGNOSIS — F17200 Nicotine dependence, unspecified, uncomplicated: Secondary | ICD-10-CM

## 2023-06-09 DIAGNOSIS — J4489 Other specified chronic obstructive pulmonary disease: Secondary | ICD-10-CM

## 2023-06-14 ENCOUNTER — Other Ambulatory Visit: Payer: Self-pay

## 2023-06-15 ENCOUNTER — Other Ambulatory Visit: Payer: Self-pay

## 2023-06-17 ENCOUNTER — Encounter: Payer: Self-pay | Admitting: Nurse Practitioner

## 2023-06-19 ENCOUNTER — Other Ambulatory Visit: Payer: Self-pay

## 2023-06-20 ENCOUNTER — Other Ambulatory Visit: Payer: Self-pay

## 2023-07-15 ENCOUNTER — Other Ambulatory Visit: Payer: Self-pay | Admitting: Nurse Practitioner

## 2023-07-15 ENCOUNTER — Other Ambulatory Visit: Payer: Self-pay

## 2023-07-15 DIAGNOSIS — J4489 Other specified chronic obstructive pulmonary disease: Secondary | ICD-10-CM

## 2023-07-15 MED ORDER — ALBUTEROL SULFATE (2.5 MG/3ML) 0.083% IN NEBU
2.5000 mg | INHALATION_SOLUTION | Freq: Four times a day (QID) | RESPIRATORY_TRACT | 0 refills | Status: DC | PRN
  Filled 2023-07-15: qty 180, 15d supply, fill #0

## 2023-07-18 ENCOUNTER — Other Ambulatory Visit: Payer: Self-pay

## 2023-08-18 ENCOUNTER — Encounter: Payer: Self-pay | Admitting: Nurse Practitioner

## 2023-08-18 ENCOUNTER — Other Ambulatory Visit: Payer: Self-pay | Admitting: Nurse Practitioner

## 2023-08-18 ENCOUNTER — Other Ambulatory Visit: Payer: Self-pay

## 2023-08-18 ENCOUNTER — Ambulatory Visit: Payer: MEDICAID | Attending: Nurse Practitioner | Admitting: Nurse Practitioner

## 2023-08-18 DIAGNOSIS — F419 Anxiety disorder, unspecified: Secondary | ICD-10-CM

## 2023-08-18 DIAGNOSIS — J4489 Other specified chronic obstructive pulmonary disease: Secondary | ICD-10-CM

## 2023-08-18 DIAGNOSIS — F418 Other specified anxiety disorders: Secondary | ICD-10-CM | POA: Diagnosis not present

## 2023-08-18 DIAGNOSIS — F32A Depression, unspecified: Secondary | ICD-10-CM | POA: Diagnosis not present

## 2023-08-18 MED ORDER — GABAPENTIN 100 MG PO CAPS
100.0000 mg | ORAL_CAPSULE | Freq: Three times a day (TID) | ORAL | 1 refills | Status: DC | PRN
  Filled 2023-08-18: qty 90, 30d supply, fill #0
  Filled 2023-10-14: qty 90, 30d supply, fill #1

## 2023-08-18 MED ORDER — ALBUTEROL SULFATE (2.5 MG/3ML) 0.083% IN NEBU
2.5000 mg | INHALATION_SOLUTION | Freq: Four times a day (QID) | RESPIRATORY_TRACT | 1 refills | Status: DC | PRN
  Filled 2023-08-18: qty 180, 15d supply, fill #0
  Filled 2023-10-01: qty 180, 15d supply, fill #1

## 2023-08-18 MED ORDER — SERTRALINE HCL 100 MG PO TABS
100.0000 mg | ORAL_TABLET | Freq: Every day | ORAL | 1 refills | Status: DC
  Filled 2023-08-18: qty 90, 90d supply, fill #0
  Filled 2023-12-05: qty 90, 90d supply, fill #1

## 2023-08-18 NOTE — Progress Notes (Signed)
 Assessment & Plan:  Karen Pham was seen today for depression and anxiety.  Diagnoses and all orders for this visit:  Anxiety and depression -     gabapentin (NEURONTIN) 100 MG capsule; Take 1 capsule (100 mg total) by mouth 3 (three) times daily as needed.  Depression with anxiety DOSE CHANGE -     sertraline (ZOLOFT) 100 MG tablet; Take 1 tablet (100 mg total) by mouth daily.    Patient has been counseled on age-appropriate routine health concerns for screening and prevention. These are reviewed and up-to-date. Referrals have been placed accordingly. Immunizations are up-to-date or declined.    Subjective:   Chief Complaint  Patient presents with   Depression   Anxiety    Karen Pham 48 y.o. female presents to office today for follow up to anxiety and Depression     She is currently prescribed  zoloft 50 mg and not sure if it is effective. Does not feel any different in regard to her mood lability. She is also taking gabapentin which mostly helps her sleep at night. She would like to take this as needed instead of on a routine TID schedule.     08/18/2023    3:21 PM 05/20/2023    3:46 PM 12/05/2022    9:15 AM  Depression screen PHQ 2/9  Decreased Interest 1 1 0  Down, Depressed, Hopeless 1 1 0  PHQ - 2 Score 2 2 0  Altered sleeping 2 1 1   Tired, decreased energy 1 1 0  Change in appetite 1 0 0  Feeling bad or failure about yourself  1 0 0  Trouble concentrating 1 1 0  Moving slowly or fidgety/restless 0 0 0  Suicidal thoughts 0 0 0  PHQ-9 Score 8 5 1   Difficult doing work/chores Somewhat difficult         08/18/2023    3:24 PM 05/20/2023    3:46 PM 12/05/2022    9:16 AM 06/04/2022    4:03 PM  GAD 7 : Generalized Anxiety Score  Nervous, Anxious, on Edge 1 0 0 0  Control/stop worrying 1 1 0 0  Worry too much - different things 1 1 0 0  Trouble relaxing 1 1 0 0  Restless 0 2 0 0  Easily annoyed or irritable 2 2 3  0  Afraid - awful might happen 3 3  0  Total GAD 7  Score 9 10  0  Anxiety Difficulty Somewhat difficult   Not difficult at all      Review of Systems  Constitutional:  Negative for fever, malaise/fatigue and weight loss.  HENT: Negative.  Negative for nosebleeds.   Eyes: Negative.  Negative for blurred vision, double vision and photophobia.  Respiratory: Negative.  Negative for cough and shortness of breath.   Cardiovascular: Negative.  Negative for chest pain, palpitations and leg swelling.  Gastrointestinal: Negative.  Negative for heartburn, nausea and vomiting.  Musculoskeletal: Negative.  Negative for myalgias.  Neurological: Negative.  Negative for dizziness, focal weakness, seizures and headaches.  Psychiatric/Behavioral:  Positive for depression. Negative for suicidal ideas. The patient is nervous/anxious and has insomnia.     Past Medical History:  Diagnosis Date   COPD (chronic obstructive pulmonary disease) (HCC)    Dental abscess 01/22/2022   Hypertension    Liver cirrhosis (HCC)    Severe cannabis use disorder (HCC) 11/14/2017    Past Surgical History:  Procedure Laterality Date   BREAST BIOPSY  05/28/2022   MM RT RADIOACTIVE  SEED LOC MAMMO GUIDE 05/28/2022 [facility]MO[facility]   CARPAL TUNNEL RELEASE Right 07/16/2021   Procedure: RIGHT CARPAL TUNNEL RELEASE;  Surgeon: Marlyne Beards, MD;  Location: [facility];  Service: Orthopedics;  Laterality: Right;  regional with monitored anesthesia care   RADIOACTIVE SEED GUIDED EXCISIONAL BREAST BIOPSY Right 05/29/2022   Procedure: RIGHT BREAST SEED LOCALIZED EXCISIONAL BIOPSY;  Surgeon: Almond Lint, MD;  Location: [facility];  Service: General;  Laterality: Right;    Family History  Problem Relation Age of Onset   Colon cancer Neg Hx    Esophageal cancer Neg Hx    Rectal cancer Neg Hx    Stomach cancer Neg Hx     Social History Reviewed with no changes to be made today.   Outpatient Medications Prior to Visit  Medication Sig Dispense  Refill   albuterol (PROVENTIL) (2.5 MG/3ML) 0.083% nebulizer solution Take 3 mLs (2.5 mg total) by nebulization every 6 (six) hours as needed for wheezing or shortness of breath. 180 mL 0   amLODipine (NORVASC) 10 MG tablet Take 1 tablet (10 mg total) by mouth daily. 90 tablet 1   atorvastatin (LIPITOR) 40 MG tablet Take 1 tablet (40 mg total) by mouth daily. 90 tablet 2   budesonide-formoterol (SYMBICORT) 160-4.5 MCG/ACT inhaler Inhale 2 puffs into the lungs 2 (two) times daily. 10.2 g 3   fluticasone-salmeterol (ADVAIR DISKUS) 250-50 MCG/ACT AEPB Inhale 1 puff into the lungs in the morning and at bedtime. 60 each 3   nicotine (NICOTINE STEP 2) 14 mg/24hr patch Place 1 patch (14 mg total) onto the skin daily. 28 patch 0   nicotine polacrilex (COMMIT) 4 MG lozenge Take 1 lozenge (4 mg total) by mouth as needed for smoking cessation. 72 tablet 0   nystatin (MYCOSTATIN) 100000 UNIT/ML suspension Take 5 mLs (500,000 Units total) by mouth 4 (four) times daily. SWISH AND SPIT. As needed for yeast or thrush in mouth 60 mL 1   pantoprazole (PROTONIX) 40 MG tablet Take 1 tablet (40 mg total) by mouth daily. 90 tablet 0   valsartan (DIOVAN) 320 MG tablet Take 1 tablet (320 mg total) by mouth daily. 90 tablet 2   VENTOLIN HFA 108 (90 Base) MCG/ACT inhaler Inhale 2 puffs into the lungs 3 (three) times daily as needed. 36 g 3   gabapentin (NEURONTIN) 100 MG capsule Take 1 capsule (100 mg total) by mouth in the morning AND 2 capsules (200 mg total) at bedtime. 360 capsule 2   sertraline (ZOLOFT) 50 MG tablet Take 1 tablet (50 mg total) by mouth daily. 90 tablet 2   benzonatate (TESSALON) 100 MG capsule Take 1 capsule (100 mg total) by mouth 2 (two) times daily as needed for cough. (Patient not taking: Reported on 08/18/2023) 20 capsule 0   No facility-administered medications prior to visit.    Allergies  Allergen Reactions   Latex Hives   Sulfa Antibiotics Hives       Objective:    There were no  vitals taken for this visit. Wt Readings from Last 3 Encounters:  05/20/23 138 lb 12.8 oz (63 kg)  02/17/23 138 lb 12.8 oz (63 kg)  12/05/22 138 lb 6.4 oz (62.8 kg)    Physical Exam Vitals and nursing note reviewed.  Constitutional:      Appearance: She is well-developed.  HENT:     Head: Normocephalic and atraumatic.  Cardiovascular:     Rate and Rhythm: Normal rate and regular rhythm.  Heart sounds: Normal heart sounds. No murmur heard.    No friction rub. No gallop.  Pulmonary:     Effort: Pulmonary effort is normal. No tachypnea or respiratory distress.     Breath sounds: Normal breath sounds. No decreased breath sounds, wheezing, rhonchi or rales.  Chest:     Chest wall: No tenderness.  Abdominal:     General: Bowel sounds are normal.     Palpations: Abdomen is soft.  Musculoskeletal:        General: Normal range of motion.     Cervical back: Normal range of motion.  Skin:    General: Skin is warm and dry.  Neurological:     Mental Status: She is alert and oriented to person, place, and time.     Coordination: Coordination normal.  Psychiatric:        Behavior: Behavior normal. Behavior is cooperative.        Thought Content: Thought content normal.        Judgment: Judgment normal.          Patient has been counseled extensively about nutrition and exercise as well as the importance of adherence with medications and regular follow-up. The patient was given clear instructions to go to ER or return to medical center if symptoms don't improve, worsen or new problems develop. The patient verbalized understanding.   Follow-up: Return in about 3 months (around 11/17/2023).   Collins Dean, FNP-BC [facility] [place], [place] [phone]   08/18/2023, 8:26 PM

## 2023-08-19 ENCOUNTER — Other Ambulatory Visit: Payer: Self-pay

## 2023-08-21 ENCOUNTER — Other Ambulatory Visit: Payer: Self-pay

## 2023-10-01 ENCOUNTER — Other Ambulatory Visit: Payer: Self-pay | Admitting: Nurse Practitioner

## 2023-10-01 DIAGNOSIS — J4489 Other specified chronic obstructive pulmonary disease: Secondary | ICD-10-CM

## 2023-10-02 ENCOUNTER — Other Ambulatory Visit: Payer: Self-pay

## 2023-10-02 MED ORDER — BUDESONIDE-FORMOTEROL FUMARATE 160-4.5 MCG/ACT IN AERO
2.0000 | INHALATION_SPRAY | Freq: Two times a day (BID) | RESPIRATORY_TRACT | 3 refills | Status: DC
Start: 2023-10-02 — End: 2024-01-26
  Filled 2023-10-02: qty 10.2, 30d supply, fill #0
  Filled 2023-11-08: qty 10.2, 30d supply, fill #1
  Filled 2023-11-20 – 2023-12-05 (×3): qty 10.2, 30d supply, fill #2
  Filled 2024-01-10: qty 10.2, 30d supply, fill #3

## 2023-10-06 ENCOUNTER — Other Ambulatory Visit: Payer: Self-pay

## 2023-10-14 ENCOUNTER — Other Ambulatory Visit: Payer: Self-pay

## 2023-10-14 ENCOUNTER — Other Ambulatory Visit: Payer: Self-pay | Admitting: Critical Care Medicine

## 2023-10-14 DIAGNOSIS — E785 Hyperlipidemia, unspecified: Secondary | ICD-10-CM

## 2023-10-14 MED ORDER — ATORVASTATIN CALCIUM 40 MG PO TABS
40.0000 mg | ORAL_TABLET | Freq: Every day | ORAL | 1 refills | Status: DC
  Filled 2023-10-14: qty 90, 90d supply, fill #0
  Filled 2024-01-10: qty 90, 90d supply, fill #1

## 2023-10-17 ENCOUNTER — Other Ambulatory Visit: Payer: Self-pay

## 2023-11-08 ENCOUNTER — Other Ambulatory Visit: Payer: Self-pay | Admitting: Nurse Practitioner

## 2023-11-08 DIAGNOSIS — J4489 Other specified chronic obstructive pulmonary disease: Secondary | ICD-10-CM

## 2023-11-10 ENCOUNTER — Other Ambulatory Visit: Payer: Self-pay

## 2023-11-10 ENCOUNTER — Telehealth: Payer: Self-pay | Admitting: Nurse Practitioner

## 2023-11-10 DIAGNOSIS — J4489 Other specified chronic obstructive pulmonary disease: Secondary | ICD-10-CM

## 2023-11-10 MED ORDER — ALBUTEROL SULFATE (2.5 MG/3ML) 0.083% IN NEBU: 2.5000 mg | INHALATION_SOLUTION | Freq: Four times a day (QID) | RESPIRATORY_TRACT | 1 refills | Status: DC | PRN

## 2023-11-10 MED ORDER — ALBUTEROL SULFATE (2.5 MG/3ML) 0.083% IN NEBU
2.5000 mg | INHALATION_SOLUTION | Freq: Four times a day (QID) | RESPIRATORY_TRACT | 1 refills | Status: DC | PRN
  Filled 2023-11-10: qty 180, 15d supply, fill #0
  Filled 2023-12-09: qty 180, 15d supply, fill #1

## 2023-11-10 NOTE — Telephone Encounter (Signed)
 Pt requesting refill on albuterol  (PROVENTIL ) (2.5 MG/3ML) 0.083% nebulizer solution

## 2023-11-10 NOTE — Addendum Note (Signed)
 Addended by: LIANE JACKOLYN GAILS on: 11/10/2023 04:20 PM   Modules accepted: Orders

## 2023-11-10 NOTE — Telephone Encounter (Signed)
 Pt calledback in checking status of refill. I let her know of 24-48hr turnqround. Pt wantas a cb with the status

## 2023-11-10 NOTE — Telephone Encounter (Signed)
 Medication and patient notified.

## 2023-11-11 ENCOUNTER — Other Ambulatory Visit: Payer: Self-pay

## 2023-11-14 ENCOUNTER — Telehealth: Payer: Self-pay | Admitting: Nurse Practitioner

## 2023-11-14 NOTE — Telephone Encounter (Signed)
 Called pt to confirm appt. Pt will be present.

## 2023-11-17 ENCOUNTER — Other Ambulatory Visit: Payer: Self-pay

## 2023-11-17 ENCOUNTER — Ambulatory Visit: Payer: MEDICAID | Attending: Nurse Practitioner | Admitting: Nurse Practitioner

## 2023-11-17 VITALS — BP 129/82 | Resp 19 | Ht 66.0 in | Wt 139.0 lb

## 2023-11-17 DIAGNOSIS — I10 Essential (primary) hypertension: Secondary | ICD-10-CM

## 2023-11-17 DIAGNOSIS — Z1231 Encounter for screening mammogram for malignant neoplasm of breast: Secondary | ICD-10-CM

## 2023-11-17 DIAGNOSIS — J4489 Other specified chronic obstructive pulmonary disease: Secondary | ICD-10-CM

## 2023-11-17 DIAGNOSIS — F419 Anxiety disorder, unspecified: Secondary | ICD-10-CM | POA: Diagnosis not present

## 2023-11-17 DIAGNOSIS — K219 Gastro-esophageal reflux disease without esophagitis: Secondary | ICD-10-CM

## 2023-11-17 DIAGNOSIS — F32A Depression, unspecified: Secondary | ICD-10-CM

## 2023-11-17 MED ORDER — GABAPENTIN 100 MG PO CAPS
100.0000 mg | ORAL_CAPSULE | Freq: Three times a day (TID) | ORAL | 1 refills | Status: DC | PRN
  Filled 2023-11-17: qty 90, 30d supply, fill #0
  Filled 2024-01-13 (×2): qty 90, 30d supply, fill #1

## 2023-11-17 MED ORDER — PANTOPRAZOLE SODIUM 40 MG PO TBEC
40.0000 mg | DELAYED_RELEASE_TABLET | Freq: Every day | ORAL | 0 refills | Status: DC
  Filled 2023-11-17: qty 90, 90d supply, fill #0

## 2023-11-17 MED ORDER — FLUTICASONE-SALMETEROL 250-50 MCG/ACT IN AEPB
1.0000 | INHALATION_SPRAY | Freq: Two times a day (BID) | RESPIRATORY_TRACT | 3 refills | Status: DC
  Filled 2023-11-17: qty 60, 30d supply, fill #0

## 2023-11-17 MED ORDER — AMLODIPINE BESYLATE 10 MG PO TABS
10.0000 mg | ORAL_TABLET | Freq: Every day | ORAL | 1 refills | Status: AC
  Filled 2023-11-17 – 2024-01-10 (×2): qty 90, 90d supply, fill #0

## 2023-11-17 MED ORDER — VENTOLIN HFA 108 (90 BASE) MCG/ACT IN AERS
2.0000 | INHALATION_SPRAY | Freq: Three times a day (TID) | RESPIRATORY_TRACT | 3 refills | Status: AC | PRN
Start: 2023-11-17 — End: ?
  Filled 2023-11-17 – 2023-11-21 (×2): qty 36, 66d supply, fill #0
  Filled 2024-02-24: qty 36, 66d supply, fill #1
  Filled 2024-05-07: qty 36, 66d supply, fill #2

## 2023-11-17 NOTE — Progress Notes (Signed)
 Assessment & Plan:  [name] was seen today for hypertension.  Diagnoses and all orders for this visit:  Primary hypertension -     CMP14+EGFR -     amLODipine  (NORVASC ) 10 MG tablet; Take 1 tablet (10 mg total) by mouth daily. Continue all antihypertensives as prescribed.  Reminded to bring in blood pressure log for follow  up appointment.  RECOMMENDATIONS: DASH/Mediterranean Diets are healthier choices for HTN.    Breast cancer screening by mammogram -     MM 3D SCREENING MAMMOGRAM BILATERAL BREAST; Future  Anxiety and depression Continue sertraline  100 mg daily and gabapentin  as needed   GERD without esophagitis  symptoms are well-controlled -     pantoprazole  (PROTONIX ) 40 MG tablet; Take 1 tablet (40 mg total) by mouth daily. INSTRUCTIONS: Avoid GERD Triggers: acidic, spicy or fried foods, caffeine, coffee, sodas,  alcohol and chocolate.      COPD with chronic bronchitis (HCC) -     VENTOLIN  HFA 108 (90 Base) MCG/ACT inhaler; Inhale 2 puffs into the lungs 3 (three) times daily as needed. -     fluticasone -salmeterol (ADVAIR  DISKUS) 250-50 MCG/ACT AEPB; Inhale 1 puff into the lungs in the morning and at bedtime. -     Ambulatory referral to Pulmonology Wheezing persists due to likely non-adherence to Advair . Suboptimal control confirmed. Increasing Advair  may reduce nebulizer use. Annual pulmonology evaluation recommended. - Refilled Ventolin  inhaler. - Instructed to take Advair  twice daily. - Referred to pulmonology for annual evaluation.  Smoking Cessation Considering cessation with nicotine  patches and job change to reduce stress and lung irritant exposure. Forklift class may facilitate job change and limit smoking. - Keep nicotine  patches on medication list for future use.  General Health Maintenance Mammogram due in August. Pneumonia and hepatitis B vaccinations discussed and postponed as she declines. Explained hepatitis B transmission and three-dose series. -  Schedule mammogram for August. - Postpone pneumonia and hepatitis B vaccinations, reassess periodically.  Patient has been counseled on age-appropriate routine health concerns for screening and prevention. These are reviewed and up-to-date. Referrals have been placed accordingly. Immunizations are up-to-date or declined.    Subjective:   Chief Complaint  Patient presents with   Hypertension    Karen Pham 48 y.o. female presents to office today for follow up to HTN  She has a past medical history of COPD, Dental abscess (01/22/2022), Hypertension, Liver cirrhosis, and Severe cannabis use disorder (11/14/2017).   HTN Blood pressure well-controlled.  She is taking amlodipine  10 mg daily and valsartan  320 mg daily.  She does continue to smoke. BP Readings from Last 3 Encounters:  11/17/23 129/82  08/19/23 (!) 145/82  05/20/23 127/77     COPD She is currently using Ventolin  as a rescue inhaler and requires a refill.  COPD is managed with Advair  and Symbicort .  She takes Symbicort  as prescribed, two puffs in the morning and two puffs in the evening.  She is only taking Advair  once a day instead of twice a day.  She is using her nebulizer frequently to alleviate wheezing. She does have a sensation of tightness when taking deep breaths.  She is considering a job change due to the physical demands and stress of her current position, which she feels impacts her respiratory health.  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    She has not received a pneumonia or hepatitis B vaccine and is curious about the hepatitis B vaccine as well as HEP  A and C, discussing transmission and the possibility of receiving the hepatitis B vaccine it in the future.   Review of Systems  Constitutional:  Negative for fever, malaise/fatigue and weight loss.  HENT: Negative.  Negative for nosebleeds.   Eyes: Negative.  Negative for blurred vision, double vision and  photophobia.  Respiratory:  Positive for shortness of breath. Negative for cough.   Cardiovascular:  Negative for chest pain, palpitations and leg swelling.       Chest tightness  Gastrointestinal:  Positive for heartburn. Negative for abdominal pain, blood in stool, constipation, diarrhea, melena, nausea and vomiting.  Musculoskeletal: Negative.  Negative for myalgias.  Neurological: Negative.  Negative for dizziness, focal weakness, seizures and headaches.  Psychiatric/Behavioral:  Positive for depression. Negative for suicidal ideas. The patient is nervous/anxious.     Past Medical History:  Diagnosis Date   COPD (chronic obstructive pulmonary disease) (HCC)    Dental abscess 01/22/2022   Hypertension    Liver cirrhosis (HCC)    Severe cannabis use disorder (HCC) 11/14/2017    Past Surgical History:  Procedure Laterality Date   BREAST BIOPSY  05/28/2022   MM RT RADIOACTIVE SEED LOC MAMMO GUIDE 05/28/2022 [facility] MAMMOGRAP[facility]   CARPAL TUNNEL RELEASE Right 07/16/2021   Procedure: RIGHT CARPAL TUNNEL RELEASE;  Surgeon: Romona Harari, MD;  Location: [facility];  Service: Orthopedics;  Laterality: Right;  regional with monitored anesthesia care   RADIOACTIVE SEED GUIDED EXCISIONAL BREAST BIOPSY Right 05/29/2022   Procedure: RIGHT BREAST SEED LOCALIZED EXCISIONAL BIOPSY;  Surgeon: Aron Shoulders, MD;  Location: [facility];  Service: General;  Laterality: Right;    Family History  Problem Relation Age of Onset   Colon cancer Neg Hx    Esophageal cancer Neg Hx    Rectal cancer Neg Hx    Stomach cancer Neg Hx     Social History Reviewed with no changes to be made today.   Outpatient Medications Prior to Visit  Medication Sig Dispense Refill   albuterol  (PROVENTIL ) (2.5 MG/3ML) 0.083% nebulizer solution Take 3 mLs (2.5 mg total) by nebulization every 6 (six) hours as needed for wheezing or shortness of breath. 180 mL 1   atorvastatin  (LIPITOR) 40 MG  tablet Take 1 tablet (40 mg total) by mouth daily. 90 tablet 1   budesonide -formoterol  (SYMBICORT ) 160-4.5 MCG/ACT inhaler Inhale 2 puffs into the lungs 2 (two) times daily. 10.2 g 3   nicotine  polacrilex (COMMIT) 4 MG lozenge Take 1 lozenge (4 mg total) by mouth as needed for smoking cessation. 72 tablet 0   nystatin  (MYCOSTATIN ) 100000 UNIT/ML suspension Take 5 mLs (500,000 Units total) by mouth 4 (four) times daily. SWISH AND SPIT. As needed for yeast or thrush in mouth 60 mL 1   sertraline  (ZOLOFT ) 100 MG tablet Take 1 tablet (100 mg total) by mouth daily. 90 tablet 1   valsartan  (DIOVAN ) 320 MG tablet Take 1 tablet (320 mg total) by mouth daily. 90 tablet 2   amLODipine  (NORVASC ) 10 MG tablet Take 1 tablet (10 mg total) by mouth daily. 90 tablet 1   fluticasone -salmeterol (ADVAIR  DISKUS) 250-50 MCG/ACT AEPB Inhale 1 puff into the lungs in the morning and at bedtime. 60 each 3   gabapentin  (NEURONTIN ) 100 MG capsule Take 1 capsule (100 mg total) by mouth 3 (three) times daily as needed. 90 capsule 1   pantoprazole  (PROTONIX ) 40 MG tablet Take 1 tablet (40 mg total) by mouth daily. 90 tablet 0   VENTOLIN  HFA  108 (90 Base) MCG/ACT inhaler Inhale 2 puffs into the lungs 3 (three) times daily as needed. 36 g 3   nicotine  (NICOTINE  STEP 2) 14 mg/24hr patch Place 1 patch (14 mg total) onto the skin daily. (Patient not taking: Reported on 11/17/2023) 28 patch 0   No facility-administered medications prior to visit.    Allergies  Allergen Reactions   Latex Hives   Sulfa Antibiotics Hives       Objective:    BP 129/82 (BP Location: Left Arm, Patient Position: Sitting, Cuff Size: Normal)   Resp 19   Ht 5' 6 (1.676 m)   Wt 139 lb (63 kg)   SpO2 100%   BMI 22.44 kg/m  Wt Readings from Last 3 Encounters:  11/17/23 139 lb (63 kg)  08/19/23 140 lb (63.5 kg)  05/20/23 138 lb 12.8 oz (63 kg)    Physical Exam Vitals and nursing note reviewed.  Constitutional:      Appearance: She is  well-developed.  HENT:     Head: Normocephalic and atraumatic.  Cardiovascular:     Rate and Rhythm: Normal rate and regular rhythm.     Heart sounds: Normal heart sounds. No murmur heard.    No friction rub. No gallop.  Pulmonary:     Effort: Pulmonary effort is normal. No tachypnea or respiratory distress.     Breath sounds: Normal breath sounds. No decreased breath sounds, wheezing, rhonchi or rales.  Chest:     Chest wall: No tenderness.  Abdominal:     General: Bowel sounds are normal.     Palpations: Abdomen is soft.  Musculoskeletal:        General: Normal range of motion.     Cervical back: Normal range of motion.  Skin:    General: Skin is warm and dry.  Neurological:     Mental Status: She is alert and oriented to person, place, and time.     Coordination: Coordination normal.  Psychiatric:        Behavior: Behavior normal. Behavior is cooperative.        Thought Content: Thought content normal.        Judgment: Judgment normal.          Patient has been counseled extensively about nutrition and exercise as well as the importance of adherence with medications and regular follow-up. The patient was given clear instructions to go to ER or return to medical center if symptoms don't improve, worsen or new problems develop. The patient verbalized understanding.   Follow-up: Return in about 4 months (around 03/19/2024).   Haze LELON Servant, FNP-BC [facility] [place], [place] [phone]   11/17/2023, 4:06 PM

## 2023-11-18 ENCOUNTER — Ambulatory Visit: Payer: Self-pay | Admitting: Nurse Practitioner

## 2023-11-18 LAB — CMP14+EGFR
ALT: 16 IU/L (ref 0–32)
AST: 25 IU/L (ref 0–40)
Albumin: 4.7 g/dL (ref 3.9–4.9)
Alkaline Phosphatase: 71 IU/L (ref 44–121)
BUN/Creatinine Ratio: 9 (ref 9–23)
BUN: 9 mg/dL (ref 6–24)
Bilirubin Total: 0.2 mg/dL (ref 0.0–1.2)
CO2: 21 mmol/L (ref 20–29)
Calcium: 10 mg/dL (ref 8.7–10.2)
Chloride: 102 mmol/L (ref 96–106)
Creatinine, Ser: 0.98 mg/dL (ref 0.57–1.00)
Globulin, Total: 2.2 g/dL (ref 1.5–4.5)
Glucose: 61 mg/dL — ABNORMAL LOW (ref 70–99)
Potassium: 4.4 mmol/L (ref 3.5–5.2)
Sodium: 141 mmol/L (ref 134–144)
Total Protein: 6.9 g/dL (ref 6.0–8.5)
eGFR: 71 mL/min/1.73 (ref >59)

## 2023-11-20 ENCOUNTER — Other Ambulatory Visit: Payer: Self-pay

## 2023-11-21 ENCOUNTER — Other Ambulatory Visit: Payer: Self-pay

## 2023-12-05 ENCOUNTER — Other Ambulatory Visit: Payer: Self-pay

## 2023-12-08 ENCOUNTER — Other Ambulatory Visit: Payer: Self-pay

## 2023-12-11 ENCOUNTER — Other Ambulatory Visit: Payer: Self-pay

## 2023-12-12 ENCOUNTER — Ambulatory Visit
Admission: RE | Admit: 2023-12-12 | Discharge: 2023-12-12 | Disposition: A | Discharge status: Home / Self Care | Payer: MEDICAID | Source: Ambulatory Visit | Attending: Nurse Practitioner | Admitting: Nurse Practitioner

## 2023-12-12 DIAGNOSIS — Z1231 Encounter for screening mammogram for malignant neoplasm of breast: Secondary | ICD-10-CM

## 2024-01-10 ENCOUNTER — Other Ambulatory Visit: Payer: Self-pay | Admitting: Nurse Practitioner

## 2024-01-10 ENCOUNTER — Other Ambulatory Visit: Payer: Self-pay

## 2024-01-10 DIAGNOSIS — J4489 Other specified chronic obstructive pulmonary disease: Secondary | ICD-10-CM

## 2024-01-11 MED ORDER — ALBUTEROL SULFATE (2.5 MG/3ML) 0.083% IN NEBU
2.5000 mg | INHALATION_SOLUTION | Freq: Four times a day (QID) | RESPIRATORY_TRACT | 1 refills | Status: DC | PRN
  Filled 2024-01-11: qty 180, 15d supply, fill #0
  Filled 2024-03-06: qty 180, 15d supply, fill #1

## 2024-01-12 ENCOUNTER — Other Ambulatory Visit: Payer: Self-pay

## 2024-01-13 ENCOUNTER — Other Ambulatory Visit: Payer: Self-pay

## 2024-01-26 ENCOUNTER — Other Ambulatory Visit: Payer: Self-pay

## 2024-01-26 ENCOUNTER — Ambulatory Visit (INDEPENDENT_AMBULATORY_CARE_PROVIDER_SITE_OTHER): Payer: MEDICAID

## 2024-01-26 VITALS — BP 139/80 | HR 73 | Temp 97.5°F | Ht 67.0 in | Wt 143.0 lb

## 2024-01-26 DIAGNOSIS — F17210 Nicotine dependence, cigarettes, uncomplicated: Secondary | ICD-10-CM

## 2024-01-26 DIAGNOSIS — J4489 Other specified chronic obstructive pulmonary disease: Secondary | ICD-10-CM

## 2024-01-26 DIAGNOSIS — R911 Solitary pulmonary nodule: Secondary | ICD-10-CM

## 2024-01-26 DIAGNOSIS — J439 Emphysema, unspecified: Secondary | ICD-10-CM

## 2024-01-26 DIAGNOSIS — Z720 Tobacco use: Secondary | ICD-10-CM

## 2024-01-26 MED ORDER — STIOLTO RESPIMAT 2.5-2.5 MCG/ACT IN AERS
2.0000 | INHALATION_SPRAY | Freq: Every day | RESPIRATORY_TRACT | 4 refills | Status: DC
  Filled 2024-01-26: qty 4, 30d supply, fill #0
  Filled 2024-02-24: qty 4, 30d supply, fill #1
  Filled 2024-05-25 (×2): qty 4, 30d supply, fill #2

## 2024-01-26 MED ORDER — VARENICLINE TARTRATE (STARTER) 0.5 MG X 11 & 1 MG X 42 PO TBPK
ORAL_TABLET | ORAL | 0 refills | Status: DC
  Filled 2024-01-26: qty 53, 28d supply, fill #0

## 2024-01-26 NOTE — Assessment & Plan Note (Signed)
 Advised to quit smoking cigarettes.  Prescription for Chantix  provided.  Advised to use nicotine  patches as well.  At least 3 minutes spent with tobacco cessation counseling.  Patient will try to quit cigarettes at this time.  Advised her to call the [place]  quit smoking help number

## 2024-01-26 NOTE — Progress Notes (Signed)
 Subjective:   PATIENT ID: Karen Pham GENDER: female DOB: 11-17-1975, MRN: [id number]   HPI 48 year old female with history of tobacco use, active tobacco use, presumed COPD presenting to the pulmonary clinic to establish care.  Patient reports shortness of breath and dyspnea on exertion for a few years.  She is currently on albuterol  rescue and Advair  as a maintenance inhaler.  She also reports cough with white-yellow sputum production.  She reports a recent 10 pound weight gain.  She denies any loss of appetite.  She denies any hemoptysis.  She has 1 cat at home.  She denies any allergies other than latex and sulfa.  She continues to smoke 1 pack/day, she started smoking when she was 17 and has at least 35-pack-year smoking history.  She has a CT chest performed on February 2025 for lung cancer screening although she does not qualify for lung cancer screening.  On the CAT scan she has a small nodular cystic lesion in the right lung.  She also has evidence of emphysematous changes related to smoking.  Past Medical History:  Diagnosis Date   COPD (chronic obstructive pulmonary disease) (HCC)    Dental abscess 01/22/2022   Hypertension    Liver cirrhosis (HCC)    Severe cannabis use disorder (HCC) 11/14/2017     Family History  Problem Relation Age of Onset   Colon cancer Neg Hx    Esophageal cancer Neg Hx    Rectal cancer Neg Hx    Stomach cancer Neg Hx      Social History   Socioeconomic History   Marital status: Single    Spouse name: Not on file   Number of children: Not on file   Years of education: Not on file   Highest education level: Some college, no degree  Occupational History    Employer: [employer]  Tobacco Use   Smoking status: Every Day    Current packs/day: 0.50    Average packs/day: 0.5 packs/day for 35.7 years (17.9 ttl pk-yrs)    Types: Cigarettes    Start date: 1990    Passive exposure: Current   Smokeless tobacco: Never  Vaping Use   Vaping  status: Never Used  Substance and Sexual Activity   Alcohol use: Not Currently   Drug use: Not Currently    Frequency: 1.0 times per week    Types: Crack cocaine, Cocaine, Other-see comments, Marijuana    Comment: 15 months clean per pt   Sexual activity: Not Currently  Other Topics Concern   Not on file  Social History Narrative   Right handed    Some caffeine   Social Drivers of Health   Financial Resource Strain: Medium Risk (11/17/2023)   Overall Financial Resource Strain (CARDIA)    Difficulty of Paying Living Expenses: Somewhat hard  Food Insecurity: Food Insecurity Present (11/17/2023)   Hunger Vital Sign    Worried About Running Out of Food in the Last Year: Sometimes true    Ran Out of Food in the Last Year: Often true  Transportation Needs: No Transportation Needs (11/17/2023)   PRAPARE - Administrator, Civil Service (Medical): No    Lack of Transportation (Non-Medical): No  Physical Activity: Sufficiently Active (11/17/2023)   Exercise Vital Sign    Days of Exercise per Week: 5 days    Minutes of Exercise per Session: 150+ min  Stress: Stress Concern Present (11/17/2023)   [employer] of Occupational Health - Occupational Stress Questionnaire  Feeling of Stress: To some extent  Social Connections: Moderately Isolated (11/17/2023)   Social Connection and Isolation Panel    Frequency of Communication with Friends and Family: Three times a week    Frequency of Social Gatherings with Friends and Family: Once a week    Attends Religious Services: Never    Database administrator or Organizations: Yes    Attends Engineer, structural: More than 4 times per year    Marital Status: Never married  Intimate Partner Violence: Unknown (08/08/2021)   Received from [facility]   HITS    Physically Hurt: Not on file    Insult or Talk Down To: Not on file    Threaten Physical Harm: Not on file    Scream or Curse: Not on file     Allergies   Allergen Reactions   Latex Hives   Sulfa Antibiotics Hives     Outpatient Medications Prior to Visit  Medication Sig Dispense Refill   albuterol  (PROVENTIL ) (2.5 MG/3ML) 0.083% nebulizer solution Take 3 mLs (2.5 mg total) by nebulization every 6 (six) hours as needed for wheezing or shortness of breath. 180 mL 1   amLODipine  (NORVASC ) 10 MG tablet Take 1 tablet (10 mg total) by mouth daily. 90 tablet 1   atorvastatin  (LIPITOR) 40 MG tablet Take 1 tablet (40 mg total) by mouth daily. 90 tablet 1   gabapentin  (NEURONTIN ) 100 MG capsule Take 1 capsule (100 mg total) by mouth 3 (three) times daily as needed. 90 capsule 1   nicotine  polacrilex (COMMIT) 4 MG lozenge Take 1 lozenge (4 mg total) by mouth as needed for smoking cessation. 72 tablet 0   nystatin  (MYCOSTATIN ) 100000 UNIT/ML suspension Take 5 mLs (500,000 Units total) by mouth 4 (four) times daily. SWISH AND SPIT. As needed for yeast or thrush in mouth 60 mL 1   pantoprazole  (PROTONIX ) 40 MG tablet Take 1 tablet (40 mg total) by mouth daily. 90 tablet 0   sertraline  (ZOLOFT ) 100 MG tablet Take 1 tablet (100 mg total) by mouth daily. 90 tablet 1   valsartan  (DIOVAN ) 320 MG tablet Take 1 tablet (320 mg total) by mouth daily. 90 tablet 2   VENTOLIN  HFA 108 (90 Base) MCG/ACT inhaler Inhale 2 puffs into the lungs 3 (three) times daily as needed. 36 g 3   budesonide -formoterol  (SYMBICORT ) 160-4.5 MCG/ACT inhaler Inhale 2 puffs into the lungs 2 (two) times daily. 10.2 g 3   fluticasone -salmeterol (ADVAIR  DISKUS) 250-50 MCG/ACT AEPB Inhale 1 puff into the lungs in the morning and at bedtime. 60 each 3   nicotine  (NICOTINE  STEP 2) 14 mg/24hr patch Place 1 patch (14 mg total) onto the skin daily. (Patient not taking: Reported on 01/26/2024) 28 patch 0   No facility-administered medications prior to visit.    ROS Reviewed all systems and reported negative except as above     Objective:   Vitals:   01/26/24 1533  BP: 139/80  Pulse: 73   Temp: (!) 97.5 F (36.4 C)  TempSrc: Temporal  SpO2: 97%  Weight: 143 lb (64.9 kg)  Height: 5' 7 (1.702 m)    Physical Exam General: Middle-aged female, ill-appearing Chest: Clear to auscultation bilaterally Heart: Regular rate and rhythm, normal S1, normal S2 Abdomen: Soft, nontender Neuro: Grossly intact    CBC    Component Value Date/Time   WBC 8.3 12/05/2022 1151   WBC 10.2 02/11/2022 1313   RBC 3.93 12/05/2022 1151   RBC 4.17 02/11/2022 1313  HGB 12.5 12/05/2022 1151   HCT 37.9 12/05/2022 1151   PLT 355 12/05/2022 1151   MCV 96 12/05/2022 1151   MCH 31.8 12/05/2022 1151   MCH 33.1 02/11/2022 1313   MCHC 33.0 12/05/2022 1151   MCHC 34.0 02/11/2022 1313   RDW 11.6 (L) 12/05/2022 1151   LYMPHSABS 3.4 (H) 12/05/2022 1151   MONOABS 0.9 02/11/2022 1313   EOSABS 0.2 12/05/2022 1151   BASOSABS 0.0 12/05/2022 1151     Chest imaging: CT chest personally interpreted and as above in the HPI  PFT: No PFTs on file       Assessment & Plan:   Assessment & Plan COPD with chronic bronchitis and emphysema (HCC) Patient with clinical symptoms related to emphysema.  CT scan showing emphysematous changes.  Currently on albuterol  and Advair .  Given diagnosis of COPD rather than asthma, plan to switch Advair  to Stiolto. Plan to obtain PFTs. -Patient is not interested in vaccinations at this time. Tobacco use Advised to quit smoking cigarettes.  Prescription for Chantix  provided.  Advised to use nicotine  patches as well.  At least 3 minutes spent with tobacco cessation counseling.  Patient will try to quit cigarettes at this time.  Advised her to call the [place]  quit smoking help number Lung nodule Noted to have a small nodular cystic lesion on the right lung.  Plan to repeat CT scan for further evaluation of this nodule  Orders Placed This Encounter  Procedures   CT Chest Wo Contrast    Standing Status:   Future    Expiration Date:   01/25/2025    Is patient  pregnant?:   No    Preferred imaging location?:   [facility]   Pulmonary function test    Standing Status:   Future    Expiration Date:   01/25/2025    Where should this test be performed?:   Outpatient Pulmonary    What type of PFT is being ordered?:   Full PFT      Zola Herter, MD [facility] Office: [phone]

## 2024-01-26 NOTE — Patient Instructions (Addendum)
 IT was nice meeting you in the clinic today   We will get pulmonary function tests and repeat your CAT scan because I saw small cystic nodule on it. Please quit smoking cigarettes, we are already seeing some smoking-related lung damage on your most recent CT scan done in February.  I have given you a prescription for Chantix  and I advise you to use the nicotine  patches as well to help you quit. Call 1-800-quit-NOW to get free nicotine  replacement and counseling from the state of [place] .   Start using Stiolto 2 puffs daily instead of the Advair .  Continue to use your nebulizer machine and your albuterol  as rescue treatments whenever needed   We will see you back in the clinic in 3 months

## 2024-01-26 NOTE — Progress Notes (Deleted)
 Subjective:   PATIENT ID: Karen Pham GENDER: female DOB: 1975-06-14, MRN: [id number]   HPI   Past Medical History:  Diagnosis Date   COPD (chronic obstructive pulmonary disease) (HCC)    Dental abscess 01/22/2022   Hypertension    Liver cirrhosis (HCC)    Severe cannabis use disorder (HCC) 11/14/2017     Family History  Problem Relation Age of Onset   Colon cancer Neg Hx    Esophageal cancer Neg Hx    Rectal cancer Neg Hx    Stomach cancer Neg Hx      Social History   Socioeconomic History   Marital status: Single    Spouse name: Not on file   Number of children: Not on file   Years of education: Not on file   Highest education level: Some college, no degree  Occupational History    Employer: [employer]  Tobacco Use   Smoking status: Every Day    Current packs/day: 0.50    Average packs/day: 0.5 packs/day for 35.7 years (17.9 ttl pk-yrs)    Types: Cigarettes    Start date: 1990    Passive exposure: Current   Smokeless tobacco: Never  Vaping Use   Vaping status: Never Used  Substance and Sexual Activity   Alcohol use: Not Currently   Drug use: Not Currently    Frequency: 1.0 times per week    Types: Crack cocaine, Cocaine, Other-see comments, Marijuana    Comment: 15 months clean per pt   Sexual activity: Not Currently  Other Topics Concern   Not on file  Social History Narrative   Right handed    Some caffeine   Social Drivers of Health   Financial Resource Strain: Medium Risk (11/17/2023)   Overall Financial Resource Strain (CARDIA)    Difficulty of Paying Living Expenses: Somewhat hard  Food Insecurity: Food Insecurity Present (11/17/2023)   Hunger Vital Sign    Worried About Running Out of Food in the Last Year: Sometimes true    Ran Out of Food in the Last Year: Often true  Transportation Needs: No Transportation Needs (11/17/2023)   PRAPARE - Administrator, Civil Service (Medical): No    Lack of Transportation (Non-Medical):  No  Physical Activity: Sufficiently Active (11/17/2023)   Exercise Vital Sign    Days of Exercise per Week: 5 days    Minutes of Exercise per Session: 150+ min  Stress: Stress Concern Present (11/17/2023)   [employer] of Occupational Health - Occupational Stress Questionnaire    Feeling of Stress: To some extent  Social Connections: Moderately Isolated (11/17/2023)   Social Connection and Isolation Panel    Frequency of Communication with Friends and Family: Three times a week    Frequency of Social Gatherings with Friends and Family: Once a week    Attends Religious Services: Never    Database administrator or Organizations: Yes    Attends Engineer, structural: More than 4 times per year    Marital Status: Never married  Intimate Partner Violence: Unknown (08/08/2021)   Received from [facility]   HITS    Physically Hurt: Not on file    Insult or Talk Down To: Not on file    Threaten Physical Harm: Not on file    Scream or Curse: Not on file     Allergies  Allergen Reactions   Latex Hives   Sulfa Antibiotics Hives     Outpatient Medications Prior to Visit  Medication Sig Dispense Refill   albuterol  (PROVENTIL ) (2.5 MG/3ML) 0.083% nebulizer solution Take 3 mLs (2.5 mg total) by nebulization every 6 (six) hours as needed for wheezing or shortness of breath. 180 mL 1   amLODipine  (NORVASC ) 10 MG tablet Take 1 tablet (10 mg total) by mouth daily. 90 tablet 1   atorvastatin  (LIPITOR) 40 MG tablet Take 1 tablet (40 mg total) by mouth daily. 90 tablet 1   gabapentin  (NEURONTIN ) 100 MG capsule Take 1 capsule (100 mg total) by mouth 3 (three) times daily as needed. 90 capsule 1   nicotine  polacrilex (COMMIT) 4 MG lozenge Take 1 lozenge (4 mg total) by mouth as needed for smoking cessation. 72 tablet 0   nystatin  (MYCOSTATIN ) 100000 UNIT/ML suspension Take 5 mLs (500,000 Units total) by mouth 4 (four) times daily. SWISH AND SPIT. As needed for yeast or thrush in mouth 60 mL  1   pantoprazole  (PROTONIX ) 40 MG tablet Take 1 tablet (40 mg total) by mouth daily. 90 tablet 0   sertraline  (ZOLOFT ) 100 MG tablet Take 1 tablet (100 mg total) by mouth daily. 90 tablet 1   valsartan  (DIOVAN ) 320 MG tablet Take 1 tablet (320 mg total) by mouth daily. 90 tablet 2   VENTOLIN  HFA 108 (90 Base) MCG/ACT inhaler Inhale 2 puffs into the lungs 3 (three) times daily as needed. 36 g 3   budesonide -formoterol  (SYMBICORT ) 160-4.5 MCG/ACT inhaler Inhale 2 puffs into the lungs 2 (two) times daily. 10.2 g 3   fluticasone -salmeterol (ADVAIR  DISKUS) 250-50 MCG/ACT AEPB Inhale 1 puff into the lungs in the morning and at bedtime. 60 each 3   nicotine  (NICOTINE  STEP 2) 14 mg/24hr patch Place 1 patch (14 mg total) onto the skin daily. (Patient not taking: Reported on 01/26/2024) 28 patch 0   No facility-administered medications prior to visit.    ROS Reviewed all systems and reported negative except as above     Objective:   Vitals:   01/26/24 1533  BP: 139/80  Pulse: 73  Temp: (!) 97.5 F (36.4 C)  TempSrc: Temporal  SpO2: 97%  Weight: 143 lb (64.9 kg)  Height: 5' 7 (1.702 m)    Physical Exam     CBC    Component Value Date/Time   WBC 8.3 12/05/2022 1151   WBC 10.2 02/11/2022 1313   RBC 3.93 12/05/2022 1151   RBC 4.17 02/11/2022 1313   HGB 12.5 12/05/2022 1151   HCT 37.9 12/05/2022 1151   PLT 355 12/05/2022 1151   MCV 96 12/05/2022 1151   MCH 31.8 12/05/2022 1151   MCH 33.1 02/11/2022 1313   MCHC 33.0 12/05/2022 1151   MCHC 34.0 02/11/2022 1313   RDW 11.6 (L) 12/05/2022 1151   LYMPHSABS 3.4 (H) 12/05/2022 1151   MONOABS 0.9 02/11/2022 1313   EOSABS 0.2 12/05/2022 1151   BASOSABS 0.0 12/05/2022 1151     Chest imaging:  PFT:     No data to display          Labs:    Echo:       Assessment & Plan:   Assessment & Plan COPD with chronic bronchitis and emphysema (HCC)  Tobacco use  Lung nodule   Orders Placed This Encounter  Procedures    CT Chest Wo Contrast    Standing Status:   Future    Expiration Date:   01/25/2025    Is patient pregnant?:   No    Preferred imaging location?:   [facility]  Pulmonary function test    Standing Status:   Future    Expiration Date:   01/25/2025    Where should this test be performed?:   Outpatient Pulmonary    What type of PFT is being ordered?:   Full PFT      Zola Herter, MD [facility] Office: [phone]

## 2024-01-27 ENCOUNTER — Other Ambulatory Visit: Payer: Self-pay

## 2024-02-16 ENCOUNTER — Ambulatory Visit: Admission: RE | Admit: 2024-02-16 | Discharge: 2024-02-16 | Disposition: A | Payer: MEDICAID | Source: Ambulatory Visit

## 2024-02-16 ENCOUNTER — Telehealth: Payer: Self-pay | Admitting: Nurse Practitioner

## 2024-02-16 DIAGNOSIS — R911 Solitary pulmonary nodule: Secondary | ICD-10-CM

## 2024-02-16 NOTE — Telephone Encounter (Signed)
 Called pt to reschedule appt. Pt did not answer and LVM for pt to return call. Please advise

## 2024-02-19 ENCOUNTER — Ambulatory Visit: Payer: Self-pay

## 2024-02-25 ENCOUNTER — Other Ambulatory Visit: Payer: Self-pay

## 2024-02-27 ENCOUNTER — Other Ambulatory Visit: Payer: Self-pay

## 2024-03-02 ENCOUNTER — Other Ambulatory Visit: Payer: Self-pay

## 2024-03-02 ENCOUNTER — Other Ambulatory Visit: Payer: Self-pay | Admitting: Nurse Practitioner

## 2024-03-02 DIAGNOSIS — K219 Gastro-esophageal reflux disease without esophagitis: Secondary | ICD-10-CM

## 2024-03-02 DIAGNOSIS — F418 Other specified anxiety disorders: Secondary | ICD-10-CM

## 2024-03-02 DIAGNOSIS — F419 Anxiety disorder, unspecified: Secondary | ICD-10-CM

## 2024-03-02 MED ORDER — SERTRALINE HCL 100 MG PO TABS
100.0000 mg | ORAL_TABLET | Freq: Every day | ORAL | 0 refills | Status: DC
  Filled 2024-03-02: qty 90, 90d supply, fill #0

## 2024-03-02 MED ORDER — PANTOPRAZOLE SODIUM 40 MG PO TBEC
40.0000 mg | DELAYED_RELEASE_TABLET | Freq: Every day | ORAL | 0 refills | Status: DC
  Filled 2024-03-02: qty 90, 90d supply, fill #0

## 2024-03-02 MED ORDER — GABAPENTIN 100 MG PO CAPS
100.0000 mg | ORAL_CAPSULE | Freq: Three times a day (TID) | ORAL | 1 refills | Status: DC | PRN
  Filled 2024-03-02: qty 90, 30d supply, fill #0
  Filled 2024-04-07: qty 90, 30d supply, fill #1

## 2024-03-04 ENCOUNTER — Other Ambulatory Visit: Payer: Self-pay

## 2024-03-05 ENCOUNTER — Telehealth: Payer: Self-pay | Admitting: *Deleted

## 2024-03-05 NOTE — Telephone Encounter (Signed)
 Copied from CRM [id number]. Topic: Clinical - Medical Advice >> Mar 04, 2024  2:15 PM Isabell A wrote: Reason for CRM: Patient would like to know what the next steps are after almost completing Rx #: [id number]  Varenicline  Tartrate, Starter, (CHANTIX  STARTING MONTH PAK) 0.5 MG X 11 & 1 MG X 42 TBPK [[id number]]     Callback number: [phone]  I called the pt and there was no answer- LMTCB.

## 2024-03-08 ENCOUNTER — Other Ambulatory Visit: Payer: Self-pay

## 2024-03-09 NOTE — Telephone Encounter (Signed)
 LMTCB x2

## 2024-03-10 ENCOUNTER — Encounter: Payer: Self-pay | Admitting: *Deleted

## 2024-03-19 ENCOUNTER — Ambulatory Visit: Payer: MEDICAID | Admitting: Nurse Practitioner

## 2024-03-22 NOTE — Telephone Encounter (Signed)
 Copied from CRM [id number]. Topic: Clinical - Medical Advice >> Mar 22, 2024  1:02 PM Corean SAUNDERS wrote: Reason for CRM: Patient is requesting a call back from Dr. Zaida or nurse regarding her chantix  starter kit as she is worried she needs to re start as she has not been following correctly.    Called and spoke with the pt. Pt states she last took Chantix  a few days ago and she is unsure if she took 0.5mg  or 1mg . Pt states she thinks she took 1mg . Pt is concerned if she should continue with 1mg  or start over at 0.5mg  since she skipped it for a few days.  If she needs to restart she will need a new rx. Dr. Zaida can you please advise

## 2024-03-22 NOTE — Telephone Encounter (Signed)
 Sending a message to mychart then completing note per protocol.

## 2024-03-23 NOTE — Telephone Encounter (Signed)
 Called and spoke with the pt and notified she can continue 1mg  twice daily. Pt is aware & verbalized understanding. Nfn

## 2024-03-24 ENCOUNTER — Telehealth: Payer: Self-pay | Admitting: Nurse Practitioner

## 2024-03-24 NOTE — Telephone Encounter (Signed)
 Contacted pt to resch appt (pt had another call) cancel appt! If the pt calls back please resch her appt that was sch 11/21

## 2024-03-26 ENCOUNTER — Ambulatory Visit: Payer: MEDICAID | Admitting: Nurse Practitioner

## 2024-04-07 ENCOUNTER — Other Ambulatory Visit: Payer: Self-pay

## 2024-04-07 ENCOUNTER — Other Ambulatory Visit: Payer: Self-pay | Admitting: Family Medicine

## 2024-04-07 DIAGNOSIS — J4489 Other specified chronic obstructive pulmonary disease: Secondary | ICD-10-CM

## 2024-04-07 DIAGNOSIS — Z720 Tobacco use: Secondary | ICD-10-CM

## 2024-04-07 DIAGNOSIS — E785 Hyperlipidemia, unspecified: Secondary | ICD-10-CM

## 2024-04-07 MED ORDER — ALBUTEROL SULFATE (2.5 MG/3ML) 0.083% IN NEBU
2.5000 mg | INHALATION_SOLUTION | Freq: Four times a day (QID) | RESPIRATORY_TRACT | 1 refills | Status: DC | PRN
  Filled 2024-04-07: qty 180, 15d supply, fill #0
  Filled 2024-05-07: qty 180, 15d supply, fill #1

## 2024-04-07 MED ORDER — VARENICLINE TARTRATE 1 MG PO TABS
1.0000 mg | ORAL_TABLET | Freq: Two times a day (BID) | ORAL | 4 refills | Status: DC
  Filled 2024-04-07: qty 60, 30d supply, fill #0
  Filled 2024-05-25 – 2024-05-27 (×2): qty 60, 30d supply, fill #1

## 2024-04-07 MED ORDER — ATORVASTATIN CALCIUM 40 MG PO TABS
40.0000 mg | ORAL_TABLET | Freq: Every day | ORAL | 1 refills | Status: AC
  Filled 2024-04-07: qty 90, 90d supply, fill #0

## 2024-04-08 ENCOUNTER — Other Ambulatory Visit: Payer: Self-pay

## 2024-04-12 ENCOUNTER — Ambulatory Visit: Payer: MEDICAID

## 2024-04-19 ENCOUNTER — Other Ambulatory Visit: Payer: Self-pay

## 2024-05-03 ENCOUNTER — Ambulatory Visit: Payer: MEDICAID | Attending: Family Medicine | Admitting: Nurse Practitioner

## 2024-05-03 ENCOUNTER — Other Ambulatory Visit: Payer: Self-pay

## 2024-05-03 ENCOUNTER — Encounter: Payer: Self-pay | Admitting: Nurse Practitioner

## 2024-05-03 VITALS — BP 106/68 | HR 77 | Ht 67.0 in | Wt 145.6 lb

## 2024-05-03 DIAGNOSIS — F17200 Nicotine dependence, unspecified, uncomplicated: Secondary | ICD-10-CM

## 2024-05-03 DIAGNOSIS — H538 Other visual disturbances: Secondary | ICD-10-CM

## 2024-05-03 DIAGNOSIS — K219 Gastro-esophageal reflux disease without esophagitis: Secondary | ICD-10-CM

## 2024-05-03 DIAGNOSIS — F419 Anxiety disorder, unspecified: Secondary | ICD-10-CM

## 2024-05-03 DIAGNOSIS — R7989 Other specified abnormal findings of blood chemistry: Secondary | ICD-10-CM

## 2024-05-03 DIAGNOSIS — I10 Essential (primary) hypertension: Secondary | ICD-10-CM | POA: Diagnosis not present

## 2024-05-03 DIAGNOSIS — F32A Depression, unspecified: Secondary | ICD-10-CM | POA: Diagnosis not present

## 2024-05-03 MED ORDER — GABAPENTIN 100 MG PO CAPS
100.0000 mg | ORAL_CAPSULE | Freq: Three times a day (TID) | ORAL | 1 refills | Status: AC | PRN
  Filled 2024-05-03 – 2024-05-05 (×2): qty 90, 30d supply, fill #0

## 2024-05-03 MED ORDER — PANTOPRAZOLE SODIUM 40 MG PO TBEC
40.0000 mg | DELAYED_RELEASE_TABLET | Freq: Every day | ORAL | 1 refills | Status: AC
  Filled 2024-05-03: qty 90, 90d supply, fill #0

## 2024-05-03 MED ORDER — NICOTINE POLACRILEX 4 MG MT LOZG
4.0000 mg | LOZENGE | OROMUCOSAL | 2 refills | Status: AC | PRN
  Filled 2024-05-03: qty 30, 30d supply, fill #0
  Filled 2024-05-03: qty 72, 30d supply, fill #0

## 2024-05-03 MED ORDER — SERTRALINE HCL 100 MG PO TABS
100.0000 mg | ORAL_TABLET | Freq: Every day | ORAL | 1 refills | Status: AC
  Filled 2024-05-03 – 2024-05-28 (×2): qty 90, 90d supply, fill #0

## 2024-05-03 NOTE — Progress Notes (Signed)
 "  Assessment & Plan:  Karen Pham was seen today for hypertension.  Diagnoses and all orders for this visit:  Primary hypertension -     CMP14+EGFR Continue amlodipine  and valsartan  as prescribed.  Reminded to bring in blood pressure log for follow  up appointment.  RECOMMENDATIONS: DASH/Mediterranean Diets are healthier choices for HTN.     Anxiety and depression -     gabapentin  (NEURONTIN ) 100 MG capsule; Take 1 capsule (100 mg total) by mouth 3 (three) times daily as needed. -     sertraline  (ZOLOFT ) 100 MG tablet; Take 1 tablet (100 mg total) by mouth daily.  GERD without esophagitis -     pantoprazole  (PROTONIX ) 40 MG tablet; Take 1 tablet (40 mg total) by mouth daily. INSTRUCTIONS: Avoid GERD Triggers: acidic, spicy or fried foods, caffeine, coffee, sodas,  alcohol and chocolate.    Blurred vision, bilateral -     Ambulatory referral to Ophthalmology Reports blurred vision. Has not had an eye exam in a long time.   - Referred to ophthalmology for routine eye exam. - Advised to try different strength readers temporarily while she is waiting to see the eye doctor   Abnormal CBC -     CBC with Differential  Tobacco dependence -     nicotine  polacrilex (COMMIT) 4 MG lozenge; Take 1 lozenge (4 mg total) by mouth as needed for smoking cessation. Continue chantix     General health maintenance Mammogram completed. Colonoscopy due next year. Pap smear not due for a few years. Declines FLU/PNA vaccines.    Patient has been counseled on age-appropriate routine health concerns for screening and prevention. These are reviewed and up-to-date. Referrals have been placed accordingly. Immunizations are up-to-date or declined.    '  Subjective:   Chief Complaint  Patient presents with   Hypertension   Karen Pham is a 48 year old female who presents for HTN follow up, smoking cessation support and routine health maintenance.  She has a past medical history of COPD, Dental  abscess (01/22/2022), Hypertension, Liver cirrhosis, and Severe cannabis use disorder (11/14/2017).   TOBACCO DEPENDENCE She is actively working on reducing cigarette consumption, currently smoking approximately twelve cigarettes per day, down from a higher amount. She uses 'lite' instead of full-flavor cigarettes. She is using nicotine  patches but not daily, which are effective when used. Additionally, she is taking Chantix  and uses nicotine  lozenges which seem to be more effective for her   She has not had an eye exam in a long time and has noticed increased blurred vision. She uses a magnifying glass with a light for reading. Requesting ophthalmology referral today.   She experiences intermittent nausea without headaches. She has not had headaches for many years, which she attributes to stopping alcohol consumption about three and a half years ago.  She is scheduled to see a pulmonary specialist on February 3rd for her COPD. She is using her inhalers as prescribed.   HTN Blood pressure is well controlled with amlodipine  10 mg daily and valsartan  320 mg daily.  BP Readings from Last 3 Encounters:  05/03/24 106/68  01/26/24 139/80  11/17/23 129/82    Anxiety and Depression Mood is stable with sertraline  and gabapentin .   Review of Systems  Constitutional:  Negative for fever, malaise/fatigue and weight loss.  HENT: Negative.  Negative for nosebleeds.   Eyes: Negative.  Negative for blurred vision, double vision and photophobia.  Respiratory: Negative.  Negative for cough and shortness of breath.   Cardiovascular:  Negative.  Negative for chest pain, palpitations and leg swelling.  Gastrointestinal:  Positive for melena and nausea. Negative for abdominal pain, blood in stool, constipation, diarrhea, heartburn and vomiting.  Musculoskeletal: Negative.  Negative for myalgias.  Neurological: Negative.  Negative for dizziness, focal weakness, seizures and headaches.   Psychiatric/Behavioral:  Negative for suicidal ideas.     Past Medical History:  Diagnosis Date   COPD (chronic obstructive pulmonary disease) (HCC)    Dental abscess 01/22/2022   Hypertension    Liver cirrhosis (HCC)    Severe cannabis use disorder (HCC) 11/14/2017    Past Surgical History:  Procedure Laterality Date   BREAST BIOPSY  05/28/2022   MM RT RADIOACTIVE SEED LOC MAMMO GUIDE 05/28/2022 [facility]MO[facility]   CARPAL TUNNEL RELEASE Right 07/16/2021   Procedure: RIGHT CARPAL TUNNEL RELEASE;  Surgeon: Karen Harari, MD;  Location: [facility];  Service: Orthopedics;  Laterality: Right;  regional with monitored anesthesia care   RADIOACTIVE SEED GUIDED EXCISIONAL BREAST BIOPSY Right 05/29/2022   Procedure: RIGHT BREAST SEED LOCALIZED EXCISIONAL BIOPSY;  Surgeon: Karen Shoulders, MD;  Location: [facility];  Service: General;  Laterality: Right;    Family History  Problem Relation Age of Onset   Colon cancer Neg Hx    Esophageal cancer Neg Hx    Rectal cancer Neg Hx    Stomach cancer Neg Hx     Social History Reviewed with no changes to be made today.   Outpatient Medications Prior to Visit  Medication Sig Dispense Refill   albuterol  (PROVENTIL ) (2.5 MG/3ML) 0.083% nebulizer solution Take 3 mLs (2.5 mg total) by nebulization every 6 (six) hours as needed for wheezing or shortness of breath. 180 mL 1   amLODipine  (NORVASC ) 10 MG tablet Take 1 tablet (10 mg total) by mouth daily. 90 tablet 1   atorvastatin  (LIPITOR) 40 MG tablet Take 1 tablet (40 mg total) by mouth daily. 90 tablet 1   nicotine  (NICOTINE  STEP 2) 14 mg/24hr patch Place 1 patch (14 mg total) onto the skin daily. 28 patch 0   nystatin  (MYCOSTATIN ) 100000 UNIT/ML suspension Take 5 mLs (500,000 Units total) by mouth 4 (four) times daily. SWISH AND SPIT. As needed for yeast or thrush in mouth 60 mL 1   Tiotropium Bromide-Olodaterol (STIOLTO RESPIMAT ) 2.5-2.5 MCG/ACT AERS Inhale 2 puffs  into the lungs daily. 4 g 4   valsartan  (DIOVAN ) 320 MG tablet Take 1 tablet (320 mg total) by mouth daily. 90 tablet 2   varenicline  (CHANTIX  CONTINUING MONTH PAK) 1 MG tablet Take 1 tablet (1 mg total) by mouth 2 (two) times daily. 60 tablet 4   VENTOLIN  HFA 108 (90 Base) MCG/ACT inhaler Inhale 2 puffs into the lungs 3 (three) times daily as needed. 36 g 3   gabapentin  (NEURONTIN ) 100 MG capsule Take 1 capsule (100 mg total) by mouth 3 (three) times daily as needed. 90 capsule 1   nicotine  polacrilex (COMMIT) 4 MG lozenge Take 1 lozenge (4 mg total) by mouth as needed for smoking cessation. 72 tablet 0   pantoprazole  (PROTONIX ) 40 MG tablet Take 1 tablet (40 mg total) by mouth daily. 90 tablet 0   sertraline  (ZOLOFT ) 100 MG tablet Take 1 tablet (100 mg total) by mouth daily. 90 tablet 0   Varenicline  Tartrate, Starter, (CHANTIX  STARTING MONTH PAK) 0.5 MG X 11 & 1 MG X 42 TBPK Please take 0.5mg  for three days followed by 0.5mg  twice daily for 4 days followed by 1mg  twice  daily (Patient not taking: Reported on 05/03/2024) 53 each 0   No facility-administered medications prior to visit.    Allergies[1]     Objective:    BP 106/68 (BP Location: Left Arm, Patient Position: Sitting, Cuff Size: Normal)   Pulse 77   Ht 5' 7 (1.702 m)   Wt 145 lb 9.6 oz (66 kg)   LMP 03/20/2024 (Exact Date)   BMI 22.80 kg/m  Wt Readings from Last 3 Encounters:  05/03/24 145 lb 9.6 oz (66 kg)  01/26/24 143 lb (64.9 kg)  11/17/23 139 lb (63 kg)    Physical Exam Vitals and nursing note reviewed.  Constitutional:      Appearance: She is well-developed.  HENT:     Head: Normocephalic and atraumatic.  Cardiovascular:     Rate and Rhythm: Normal rate and regular rhythm.     Heart sounds: Normal heart sounds. No murmur heard.    No friction rub. No gallop.  Pulmonary:     Effort: Pulmonary effort is normal. No tachypnea or respiratory distress.     Breath sounds: Normal breath sounds. No decreased breath  sounds, wheezing, rhonchi or rales.  Chest:     Chest wall: No tenderness.  Musculoskeletal:        General: Normal range of motion.     Cervical back: Normal range of motion.  Skin:    General: Skin is warm and dry.  Neurological:     Mental Status: She is alert and oriented to person, place, and time.     Coordination: Coordination normal.  Psychiatric:        Behavior: Behavior normal. Behavior is cooperative.        Thought Content: Thought content normal.        Judgment: Judgment normal.          Patient has been counseled extensively about nutrition and exercise as well as the importance of adherence with medications and regular follow-up. The patient was given clear instructions to go to ER or return to medical center if symptoms don't improve, worsen or new problems develop. The patient verbalized understanding.   Follow-up: Return in about 3 months (around 08/13/2024).   Haze LELON Servant, FNP-BC [facility] [place], [place] [phone]   05/03/2024, 12:56 PM     [1]  Allergies Allergen Reactions   Latex Hives   Sulfa Antibiotics Hives   "

## 2024-05-04 ENCOUNTER — Ambulatory Visit: Payer: Self-pay | Admitting: Nurse Practitioner

## 2024-05-04 LAB — CBC WITH DIFFERENTIAL/PLATELET
Basophils Absolute: 0.1 x10E3/uL (ref 0.0–0.2)
Basos: 1 %
EOS (ABSOLUTE): 0.2 x10E3/uL (ref 0.0–0.4)
Eos: 3 %
Hematocrit: 39 % (ref 34.0–46.6)
Hemoglobin: 12.4 g/dL (ref 11.1–15.9)
Immature Grans (Abs): 0 x10E3/uL (ref 0.0–0.1)
Immature Granulocytes: 0 %
Lymphocytes Absolute: 2.7 x10E3/uL (ref 0.7–3.1)
Lymphs: 30 %
MCH: 29.5 pg (ref 26.6–33.0)
MCHC: 31.8 g/dL (ref 31.5–35.7)
MCV: 93 fL (ref 79–97)
Monocytes Absolute: 0.5 x10E3/uL (ref 0.1–0.9)
Monocytes: 5 %
Neutrophils Absolute: 5.4 x10E3/uL (ref 1.4–7.0)
Neutrophils: 61 %
Platelets: 282 x10E3/uL (ref 150–450)
RBC: 4.2 x10E6/uL (ref 3.77–5.28)
RDW: 13.1 % (ref 11.7–15.4)
WBC: 8.9 x10E3/uL (ref 3.4–10.8)

## 2024-05-04 LAB — CMP14+EGFR
ALT: 11 IU/L (ref 0–32)
AST: 18 IU/L (ref 0–40)
Albumin: 4.1 g/dL (ref 3.9–4.9)
Alkaline Phosphatase: 62 IU/L (ref 41–116)
BUN/Creatinine Ratio: 9 (ref 9–23)
BUN: 10 mg/dL (ref 6–24)
Bilirubin Total: <0.2 mg/dL (ref 0.0–1.2)
CO2: 22 mmol/L (ref 20–29)
Calcium: 9.6 mg/dL (ref 8.7–10.2)
Chloride: 105 mmol/L (ref 96–106)
Creatinine, Ser: 1.06 mg/dL — ABNORMAL HIGH (ref 0.57–1.00)
Globulin, Total: 2 g/dL (ref 1.5–4.5)
Glucose: 93 mg/dL (ref 70–99)
Potassium: 4.4 mmol/L (ref 3.5–5.2)
Sodium: 142 mmol/L (ref 134–144)
Total Protein: 6.1 g/dL (ref 6.0–8.5)
eGFR: 64 mL/min/1.73

## 2024-05-05 ENCOUNTER — Other Ambulatory Visit: Payer: Self-pay

## 2024-05-07 ENCOUNTER — Other Ambulatory Visit: Payer: Self-pay | Admitting: Nurse Practitioner

## 2024-05-07 ENCOUNTER — Other Ambulatory Visit: Payer: Self-pay

## 2024-05-07 DIAGNOSIS — J4540 Moderate persistent asthma, uncomplicated: Secondary | ICD-10-CM

## 2024-05-07 MED ORDER — ALBUTEROL SULFATE HFA 108 (90 BASE) MCG/ACT IN AERS
2.0000 | INHALATION_SPRAY | Freq: Four times a day (QID) | RESPIRATORY_TRACT | 1 refills | Status: DC | PRN
  Filled 2024-05-07: qty 36, 50d supply, fill #0
  Filled 2024-05-25: qty 36, 50d supply, fill #1

## 2024-05-11 ENCOUNTER — Ambulatory Visit: Payer: MEDICAID | Admitting: Nurse Practitioner

## 2024-05-25 ENCOUNTER — Other Ambulatory Visit: Payer: Self-pay

## 2024-05-26 ENCOUNTER — Other Ambulatory Visit: Payer: Self-pay

## 2024-05-26 ENCOUNTER — Other Ambulatory Visit (HOSPITAL_BASED_OUTPATIENT_CLINIC_OR_DEPARTMENT_OTHER): Payer: Self-pay

## 2024-05-27 ENCOUNTER — Other Ambulatory Visit: Payer: Self-pay

## 2024-05-28 ENCOUNTER — Other Ambulatory Visit: Payer: Self-pay

## 2024-05-28 ENCOUNTER — Ambulatory Visit: Payer: Self-pay

## 2024-05-28 NOTE — Telephone Encounter (Signed)
 FYI Only or Action Required?: Action required by provider: clinical question for provider.  Patient was last seen in primary care on 05/03/2024 by Theotis Haze ORN, NP.  Called Nurse Triage reporting Respiratory Distress.  Symptoms began yesterday.  Interventions attempted: Nothing.  Symptoms are: gradually worsening.Cough, runny nose,SOB with exertion. Has COPD. Using nebulizer.Cannot come in for visit, working today,Asking for Prednisone  and antibiotic to be called in. Please advise pt.  Triage Disposition: See HCP Within 4 Hours (Or PCP Triage)  Patient/caregiver understands and will follow disposition?: No, wishes to speak with PCP      Summary: difficulty breathing   Reason for Triage: Patient experiencing difficulty breathing since yesterday.  Patient requesting prednisone .     Reason for Disposition  [1] MILD difficulty breathing (e.g., minimal/no SOB at rest, SOB with walking, pulse < 100) AND [2] NEW-onset or WORSE than normal  Answer Assessment - Initial Assessment Questions 1. RESPIRATORY STATUS: Describe your breathing? (e.g., wheezing, shortness of breath, unable to speak, severe coughing)      SOB 2. ONSET: When did this breathing problem begin?      yesterday 3. PATTERN Does the difficult breathing come and go, or has it been constant since it started?      With exertion 4. SEVERITY: How bad is your breathing? (e.g., mild, moderate, severe)      moderate 5. RECURRENT SYMPTOM: Have you had difficulty breathing before? If Yes, ask: When was the last time? and What happened that time?      yes 6. CARDIAC HISTORY: Do you have any history of heart disease? (e.g., heart attack, angina, bypass surgery, angioplasty)      no 7. LUNG HISTORY: Do you have any history of lung disease?  (e.g., pulmonary embolus, asthma, emphysema)     COPD 8. CAUSE: What do you think is causing the breathing problem?      COPD 9. OTHER SYMPTOMS: Do you have any other  symptoms? (e.g., chest pain, cough, dizziness, fever, runny nose)     RUNNY NOSE, cough 10. O2 SATURATION MONITOR:  Do you use an oxygen saturation monitor (pulse oximeter) at home? If Yes, ask: What is your reading (oxygen level) today? What is your usual oxygen saturation reading? (e.g., 95%)       no 11. PREGNANCY: Is there any chance you are pregnant? When was your last menstrual period?       no 12. TRAVEL: Have you traveled out of the country in the last month? (e.g., travel history, exposures)       no  Protocols used: Breathing Difficulty-A-AH

## 2024-05-28 NOTE — Telephone Encounter (Signed)
 Copied from CRM [id number]. Topic: Clinical - Medical Advice >> May 28, 2024  3:03 PM Myrick T wrote: Reason for CRM: patient called back to see what the provider wanted him to do. Patient said he has to come to the pharmacy and will stop by the office when he gets into to town

## 2024-05-28 NOTE — Telephone Encounter (Signed)
 Patient has been advised to go to the UC. Patient needs to be seen before medication requested can be prescribed.

## 2024-06-08 ENCOUNTER — Other Ambulatory Visit: Payer: Self-pay

## 2024-06-08 ENCOUNTER — Ambulatory Visit: Payer: MEDICAID

## 2024-06-08 ENCOUNTER — Ambulatory Visit: Payer: MEDICAID | Admitting: *Deleted

## 2024-06-08 DIAGNOSIS — J4489 Other specified chronic obstructive pulmonary disease: Secondary | ICD-10-CM

## 2024-06-08 DIAGNOSIS — J439 Emphysema, unspecified: Secondary | ICD-10-CM

## 2024-06-08 DIAGNOSIS — J4540 Moderate persistent asthma, uncomplicated: Secondary | ICD-10-CM

## 2024-06-08 DIAGNOSIS — Z720 Tobacco use: Secondary | ICD-10-CM

## 2024-06-08 DIAGNOSIS — F17210 Nicotine dependence, cigarettes, uncomplicated: Secondary | ICD-10-CM

## 2024-06-08 LAB — PULMONARY FUNCTION TEST
DL/VA % pred: 78 %
DL/VA: 3.34 ml/min/mmHg/L
DLCO cor % pred: 67 %
DLCO cor: 15.2 ml/min/mmHg
DLCO unc % pred: 65 %
DLCO unc: 14.71 ml/min/mmHg
FEF 25-75 Post: 0.77 L/s
FEF 25-75 Pre: 0.72 L/s
FEF2575-%Change-Post: 6 %
FEF2575-%Pred-Post: 26 %
FEF2575-%Pred-Pre: 24 %
FEV1-%Change-Post: 6 %
FEV1-%Pred-Post: 53 %
FEV1-%Pred-Pre: 50 %
FEV1-Post: 1.62 L
FEV1-Pre: 1.52 L
FEV1FVC-%Change-Post: 2 %
FEV1FVC-%Pred-Pre: 65 %
FEV6-%Change-Post: 2 %
FEV6-%Pred-Post: 78 %
FEV6-%Pred-Pre: 76 %
FEV6-Post: 2.91 L
FEV6-Pre: 2.84 L
FEV6FVC-%Change-Post: 0 %
FEV6FVC-%Pred-Post: 100 %
FEV6FVC-%Pred-Pre: 100 %
FVC-%Change-Post: 3 %
FVC-%Pred-Post: 78 %
FVC-%Pred-Pre: 75 %
FVC-Post: 2.98 L
FVC-Pre: 2.89 L
Post FEV1/FVC ratio: 54 %
Post FEV6/FVC ratio: 98 %
Pre FEV1/FVC ratio: 53 %
Pre FEV6/FVC Ratio: 98 %
RV % pred: 287 %
RV: 5.36 L
TLC % pred: 162 %
TLC: 8.72 L

## 2024-06-08 MED ORDER — ALBUTEROL SULFATE HFA 108 (90 BASE) MCG/ACT IN AERS
2.0000 | INHALATION_SPRAY | Freq: Four times a day (QID) | RESPIRATORY_TRACT | 1 refills | Status: AC | PRN
  Filled 2024-06-08: qty 36, 50d supply, fill #0
  Filled 2024-06-08: qty 36, 30d supply, fill #0

## 2024-06-08 MED ORDER — ALBUTEROL SULFATE (2.5 MG/3ML) 0.083% IN NEBU
2.5000 mg | INHALATION_SOLUTION | Freq: Four times a day (QID) | RESPIRATORY_TRACT | 1 refills | Status: AC | PRN
  Filled 2024-06-08: qty 180, 15d supply, fill #0

## 2024-06-08 MED ORDER — VARENICLINE TARTRATE 1 MG PO TABS
1.0000 mg | ORAL_TABLET | Freq: Two times a day (BID) | ORAL | 4 refills | Status: AC
  Filled 2024-06-08: qty 60, 30d supply, fill #0

## 2024-06-08 MED ORDER — STIOLTO RESPIMAT 2.5-2.5 MCG/ACT IN AERS
2.0000 | INHALATION_SPRAY | Freq: Every day | RESPIRATORY_TRACT | 4 refills | Status: AC
  Filled 2024-06-08: qty 4, 30d supply, fill #0

## 2024-06-08 NOTE — Patient Instructions (Signed)
 Full PFT performed today.

## 2024-06-08 NOTE — Progress Notes (Signed)
 Full PFT performed today.

## 2024-06-11 ENCOUNTER — Other Ambulatory Visit: Payer: Self-pay

## 2024-08-13 ENCOUNTER — Ambulatory Visit: Payer: Self-pay | Admitting: Nurse Practitioner

## 2024-09-13 ENCOUNTER — Ambulatory Visit: Payer: MEDICAID
